# Patient Record
Sex: Male | Born: 1940 | Race: White | Hispanic: No | Smoking: Never smoker
Health system: Southern US, Community
[De-identification: ages and names within clinical notes are randomized; demographics above are authoritative.]

## PROBLEM LIST (undated history)

## (undated) DIAGNOSIS — O2230 Deep phlebothrombosis in pregnancy, unspecified trimester: Secondary | ICD-10-CM

## (undated) DIAGNOSIS — I495 Sick sinus syndrome: Secondary | ICD-10-CM

## (undated) DIAGNOSIS — I2699 Other pulmonary embolism without acute cor pulmonale: Secondary | ICD-10-CM

## (undated) DIAGNOSIS — R4182 Altered mental status, unspecified: Secondary | ICD-10-CM

## (undated) DIAGNOSIS — I10 Essential (primary) hypertension: Secondary | ICD-10-CM

## (undated) HISTORY — DX: Essential (primary) hypertension: I10

## (undated) HISTORY — DX: Sick sinus syndrome: I49.5

## (undated) HISTORY — PX: LAPAROSCOPIC CHOLECYSTECTOMY: SUR755

## (undated) HISTORY — PX: INSERT / REPLACE / REMOVE PACEMAKER: SUR710

## (undated) NOTE — *Deleted (*Deleted)
MD paged regarding persistent elevated HR. New orders received to increase metoprolol.

## (undated) NOTE — *Deleted (*Deleted)
Physical Medicine and Rehabilitation Admission H&P    Chief Complaint  Patient presents with  . Altered Mental Status  : HPI: Luke Crosby. 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  Patient reportedly lives with son independent and driving prior to admission.  1 level home with ramped entrance.  Intermittent supervision provided by family.  Presented initially presented to [facility] 04/26/2020 with gradual decline.  There was no report of any recent fall or trauma.  He was recently diagnosed with pulmonary emboli in September with increasing confusion as well as being treated for E. coli UTI.  CT the head showed a 5.8 cm left frontal intraparenchymal hemorrhage with a 4 mm rightward midline shift.  Multifocal left frontal hyperdensity some of which demonstrate internal fluid levels suspicious for septic emboli.  CT angiogram of head and neck with no large vessel occlusion dissection or aneurysm.  His Eliquis was reversed.  Lower extremity Dopplers negative for DVT.  Admission chemistries unremarkable except glucose 118 hemoglobin 12.6 hemoglobin A1c 5.8.  Echocardiogram with ejection fraction of 55% no wall motion abnormalities.  EEG suggestive of cortical dysfunction left frontal region consistent with underlying ICH.  Moderate diffuse encephalopathy no seizure.  Neurosurgery Dr. Franky Macho consulted recommend conservative care no surgical intervention.  He was cleared to begin subcutaneous heparin for DVT prophylaxis 04/28/2020.  His latest cranial CT scan 04/30/2020 showed moderate to large frontal hematoma unchanged in size intraventricular hemorrhage slightly improved no hydrocephalus.  4 mm midline shift to the right unchanged.  Patient currently remains n.p.o. with alternative means of nutritional support.  Bouts of urinary retention placed on  Urecholine.  His cardiac rate remained controlled with Eliquis on hold due to ICH.  He continues on Cardizem 90 mg every 6 hours.  Therapy evaluations completed with recommendations of physical medicine rehab consult and patient was admitted for a comprehensive rehab program.  Review of Systems  Unable to perform ROS: Acuity of condition   Past Medical History:  Diagnosis Date  . Altered mental status 05/29/2015  . DVT (deep vein thrombosis) in pregnancy   . Hypertension   . Pulmonary embolism (HCC)   . Sinus node dysfunction (HCC)    With pacemaker   Past Surgical History:  Procedure Laterality Date  . INSERT / REPLACE / REMOVE PACEMAKER  ~ 2009   Medtronic Versa VEDro1  . LAPAROSCOPIC CHOLECYSTECTOMY     Family History  Problem Relation Age of Onset  . Hypertension Sister    Social History:  reports that he has never smoked. He has quit using smokeless tobacco.  His smokeless tobacco use included chew. He reports that he does not drink alcohol and does not use drugs. Allergies: No Known Allergies Medications Prior to Admission  Medication Sig Dispense Refill  . amoxicillin (AMOXIL) 500 MG capsule Take 500 mg by mouth 3 (three) times daily.    Marland Kitchen apixaban (ELIQUIS) 5 MG TABS tablet Take 1 tablet (5 mg total) by mouth 2 (two) times daily. 60 tablet 2  . colchicine 0.6 MG tablet Take 1 tablet (0.6 mg total) by mouth 2 (two) times daily. 10 tablet 0    Drug Regimen Review Drug regimen was reviewed and remains appropriate with no significant issues identified  Home: Home Living Family/patient expects to be discharged to:: Private residence Living Arrangements: Children Available Help at Discharge: Family, Available  24 hours/day (son, niece and other family members can provide 24/7 assist) Type of Home: House Home Access: Ramped entrance Home Layout: One level Bathroom Shower/Tub: Engineer, manufacturing systems: Standard Bathroom Accessibility: Yes Home Equipment: Environmental consultant - 2  wheels, Cane - single point, Bedside commode Additional Comments: verified by son  Lives With: Son   Functional History: Prior Function Level of Independence: Independent Comments: Tourist information centre manager, drives  Functional Status:  Mobility: Bed Mobility Overal bed mobility: Needs Assistance Bed Mobility: Supine to Sit Supine to sit: Total assist, +2 for physical assistance Sit to supine: Total assist, +2 for physical assistance General bed mobility comments: Pt does not follow directional commands without addition of tactile cues.  He is generally stiff and mildly resistant to changes in position, making it difficult to come forward to upright sitting. Transfers Overall transfer level: Needs assistance Equipment used: 2 person hand held assist Transfers: Sit to/from Stand, Stand Pivot Transfers Sit to Stand: Total assist, +2 physical assistance, +2 safety/equipment, Max assist Stand pivot transfers: Max assist, Total assist, +2 safety/equipment General transfer comment: Attempted to assist forward and boost slow enough for patient to follow tactile cues but he can come out of the squat position without significant assist.  Attempted x2, once with chair for UE support. Ambulation/Gait General Gait Details: unable    ADL: ADL Overall ADL's : Needs assistance/impaired Eating/Feeding: NPO Grooming: Wash/dry face, Total assistance, Sitting Grooming Details (indicate cue type and reason): EOB (total-min guard A sitting EOB) General ADL Comments: pt currently totalA for ADL at this time. attempted hand over hand to engage in familiar/basic ADL task (combing hair), pt holding onto comb but with no attempts to carry over task, somewhat resistive to OT attempting hand over hand to perform task   Cognition: Cognition Overall Cognitive Status: No family/caregiver present to determine baseline cognitive functioning Orientation Level: Oriented to person Cognition Arousal/Alertness:  Awake/alert Behavior During Therapy: Flat affect Overall Cognitive Status: No family/caregiver present to determine baseline cognitive functioning General Comments: pt following therapist visually around the room, Localizes when his name is called, does not follow commands, vocalized potentially to question in nonsensical speech.  Physical Exam: Blood pressure (!) 135/95, pulse 93, temperature 98.3 F (36.8 C), temperature source Oral, resp. rate 19, height 5\' 6"  (1.676 m), weight 74.2 kg, SpO2 94 %. Physical Exam Neurological:     Comments: Patient does open eyes to verbal stimuli however essentially remains nonverbal with some mumbling speech.  He did follow some inconsistent one-step commands.  Nasogastric tube in place.     Results for orders placed or performed during the hospital encounter of 04/26/20 (from the past 48 hour(s))  Glucose, capillary     Status: Abnormal   Collection Time: 05/05/20 11:52 AM  Result Value Ref Range   Glucose-Capillary 148 (H) 70 - 99 mg/dL    Comment: Glucose reference range applies only to samples taken after fasting for at least 8 hours.  Glucose, capillary     Status: Abnormal   Collection Time: 05/05/20  4:37 PM  Result Value Ref Range   Glucose-Capillary 159 (H) 70 - 99 mg/dL    Comment: Glucose reference range applies only to samples taken after fasting for at least 8 hours.  Glucose, capillary     Status: Abnormal   Collection Time: 05/05/20  8:42 PM  Result Value Ref Range   Glucose-Capillary 134 (H) 70 - 99 mg/dL    Comment: Glucose reference range applies only to samples taken after fasting  for at least 8 hours.  Glucose, capillary     Status: Abnormal   Collection Time: 05/05/20 11:45 PM  Result Value Ref Range   Glucose-Capillary 167 (H) 70 - 99 mg/dL    Comment: Glucose reference range applies only to samples taken after fasting for at least 8 hours.  Glucose, capillary     Status: Abnormal   Collection Time: 05/06/20  3:55 AM   Result Value Ref Range   Glucose-Capillary 152 (H) 70 - 99 mg/dL    Comment: Glucose reference range applies only to samples taken after fasting for at least 8 hours.  Glucose, capillary     Status: Abnormal   Collection Time: 05/06/20  9:39 AM  Result Value Ref Range   Glucose-Capillary 148 (H) 70 - 99 mg/dL    Comment: Glucose reference range applies only to samples taken after fasting for at least 8 hours.  Glucose, capillary     Status: Abnormal   Collection Time: 05/06/20  1:24 PM  Result Value Ref Range   Glucose-Capillary 147 (H) 70 - 99 mg/dL    Comment: Glucose reference range applies only to samples taken after fasting for at least 8 hours.  Glucose, capillary     Status: Abnormal   Collection Time: 05/06/20  6:38 PM  Result Value Ref Range   Glucose-Capillary 160 (H) 70 - 99 mg/dL    Comment: Glucose reference range applies only to samples taken after fasting for at least 8 hours.  Glucose, capillary     Status: Abnormal   Collection Time: 05/06/20  8:16 PM  Result Value Ref Range   Glucose-Capillary 149 (H) 70 - 99 mg/dL    Comment: Glucose reference range applies only to samples taken after fasting for at least 8 hours.  Glucose, capillary     Status: Abnormal   Collection Time: 05/07/20 12:06 AM  Result Value Ref Range   Glucose-Capillary 177 (H) 70 - 99 mg/dL    Comment: Glucose reference range applies only to samples taken after fasting for at least 8 hours.   Comment 1 Notify RN   Glucose, capillary     Status: Abnormal   Collection Time: 05/07/20  4:07 AM  Result Value Ref Range   Glucose-Capillary 157 (H) 70 - 99 mg/dL    Comment: Glucose reference range applies only to samples taken after fasting for at least 8 hours.   Comment 1 Notify RN   Glucose, capillary     Status: Abnormal   Collection Time: 05/07/20  8:16 AM  Result Value Ref Range   Glucose-Capillary 169 (H) 70 - 99 mg/dL    Comment: Glucose reference range applies only to samples taken after  fasting for at least 8 hours.   Comment 1 Notify RN    Comment 2 Document in Chart    No results found.     Medical Problem List and Plan: 1.  Decreased functional with cognitive decline mobility secondary to left frontal intraparenchymal hemorrhage  -patient may *** shower  -ELOS/Goals: *** 2.  Antithrombotics: -DVT/anticoagulation/history of DVT pulmonary emboli September 2021: Vascular study lower extremities 04/26/2020 negative.  Subcutaneous heparin initiated 04/28/2020  -antiplatelet therapy: N/A 3. Pain Management: Tylenol as needed 4. Mood: Amantadine 100 mg twice daily  -antipsychotic agents: N/A 5. Neuropsych: This patient is not capable of making decisions on his own behalf. 6. Skin/Wound Care: Routine skin checks 7. Fluids/Electrolytes/Nutrition: Routine in and outs with follow-up chemistries 8.  Dysphagia.  Currently NPO.  Nasogastric tube  feeds.  Speech therapy follow-up 9.  PAF with pacemaker.  Cardiac rate controlled.  Continue Cardizem 90 mg every 6 hours.  Eliquis on hold due to IPH. 10.  BPH with urinary retention.  Urecholine 10 mg 3 times daily.  Patient did have a Foley catheter tube.  Plan voiding trial   ***  Charlton Amor, PA-C 05/07/2020

---

## 2006-03-23 ENCOUNTER — Emergency Department (HOSPITAL_COMMUNITY): Admission: EM | Admit: 2006-03-23 | Discharge: 2006-03-23 | Payer: Self-pay | Admitting: Emergency Medicine

## 2006-03-24 ENCOUNTER — Encounter: Payer: Self-pay | Admitting: Emergency Medicine

## 2006-03-24 ENCOUNTER — Inpatient Hospital Stay (HOSPITAL_COMMUNITY): Admission: EM | Admit: 2006-03-24 | Discharge: 2006-03-26 | Payer: Self-pay | Admitting: Emergency Medicine

## 2006-03-24 ENCOUNTER — Ambulatory Visit: Payer: Self-pay | Admitting: Cardiology

## 2006-03-25 ENCOUNTER — Encounter: Payer: Self-pay | Admitting: Cardiovascular Disease

## 2006-04-10 ENCOUNTER — Ambulatory Visit: Payer: Self-pay

## 2006-07-17 ENCOUNTER — Ambulatory Visit: Payer: Self-pay | Admitting: Internal Medicine

## 2006-07-17 LAB — CONVERTED CEMR LAB
BUN: 14 mg/dL (ref 6–23)
CO2: 30 meq/L (ref 19–32)
Calcium: 9.3 mg/dL (ref 8.4–10.5)
GFR calc Af Amer: 109 mL/min
GFR calc non Af Amer: 90 mL/min
Potassium: 3.8 meq/L (ref 3.5–5.1)

## 2006-08-06 ENCOUNTER — Ambulatory Visit: Payer: Self-pay | Admitting: Internal Medicine

## 2007-04-13 ENCOUNTER — Ambulatory Visit: Payer: Self-pay | Admitting: Internal Medicine

## 2007-07-22 ENCOUNTER — Ambulatory Visit: Payer: Self-pay | Admitting: Internal Medicine

## 2007-10-27 ENCOUNTER — Ambulatory Visit: Payer: Self-pay | Admitting: Internal Medicine

## 2008-02-17 ENCOUNTER — Ambulatory Visit: Payer: Self-pay | Admitting: Internal Medicine

## 2008-03-29 ENCOUNTER — Ambulatory Visit: Payer: Self-pay | Admitting: Internal Medicine

## 2008-07-11 ENCOUNTER — Ambulatory Visit: Payer: Self-pay | Admitting: Internal Medicine

## 2008-10-10 ENCOUNTER — Encounter: Payer: Self-pay | Admitting: Internal Medicine

## 2008-10-20 ENCOUNTER — Encounter: Payer: Self-pay | Admitting: Internal Medicine

## 2009-01-28 ENCOUNTER — Encounter: Payer: Self-pay | Admitting: Internal Medicine

## 2009-01-30 ENCOUNTER — Ambulatory Visit: Payer: Self-pay | Admitting: Internal Medicine

## 2009-02-06 ENCOUNTER — Encounter: Payer: Self-pay | Admitting: Internal Medicine

## 2009-04-14 DIAGNOSIS — I495 Sick sinus syndrome: Secondary | ICD-10-CM | POA: Insufficient documentation

## 2009-04-18 ENCOUNTER — Ambulatory Visit: Payer: Self-pay | Admitting: Internal Medicine

## 2009-04-18 DIAGNOSIS — R030 Elevated blood-pressure reading, without diagnosis of hypertension: Secondary | ICD-10-CM | POA: Insufficient documentation

## 2009-07-25 ENCOUNTER — Encounter: Payer: Self-pay | Admitting: Internal Medicine

## 2009-09-04 ENCOUNTER — Encounter: Payer: Self-pay | Admitting: Internal Medicine

## 2009-09-17 ENCOUNTER — Encounter: Payer: Self-pay | Admitting: Internal Medicine

## 2009-09-18 ENCOUNTER — Ambulatory Visit: Payer: Self-pay | Admitting: Internal Medicine

## 2009-09-25 ENCOUNTER — Encounter: Payer: Self-pay | Admitting: Internal Medicine

## 2009-12-20 ENCOUNTER — Ambulatory Visit: Payer: Self-pay | Admitting: Internal Medicine

## 2010-01-10 ENCOUNTER — Encounter: Payer: Self-pay | Admitting: Internal Medicine

## 2010-03-30 ENCOUNTER — Telehealth: Payer: Self-pay | Admitting: Internal Medicine

## 2010-04-12 ENCOUNTER — Encounter: Payer: Self-pay | Admitting: Internal Medicine

## 2010-04-12 ENCOUNTER — Ambulatory Visit: Payer: Self-pay | Admitting: Internal Medicine

## 2010-04-12 DIAGNOSIS — I4891 Unspecified atrial fibrillation: Secondary | ICD-10-CM | POA: Insufficient documentation

## 2010-07-16 ENCOUNTER — Ambulatory Visit
Admission: RE | Admit: 2010-07-16 | Discharge: 2010-07-16 | Payer: Self-pay | Source: Home / Self Care | Attending: Internal Medicine | Admitting: Internal Medicine

## 2010-07-16 ENCOUNTER — Encounter: Payer: Self-pay | Admitting: Internal Medicine

## 2010-07-24 NOTE — Letter (Signed)
Summary: Device-Delinquent Phone Journalist, newspaper, Main Office  [address]   [place], [place] [address]   Phone: [phone]  Fax: [phone]     September 04, 2009 MRN: [id number]   MCKADE GURKA [address] [place], [place]  [address]   Dear Mr. Hauk,  According to our records, you were scheduled for a device phone transmission on   July 19, 2009.     We did not receive any results from this check.  If you transmitted on your scheduled day, please call us to help troubleshoot your system.  If you forgot to send your transmission, please send one upon receipt of this letter.  Thank you,   Architectural technologist [facility]    2ND LETTER.AS

## 2010-07-24 NOTE — Cardiovascular Report (Signed)
Summary: Office Visit Remote   Office Visit Remote   Imported By: Roderic Ovens 10/03/2009 16:43:09  _____________________________________________________________________  External Attachment:    Type:   Image     Comment:   External Document

## 2010-07-24 NOTE — Cardiovascular Report (Signed)
Summary: Office Visit Remote   Office Visit Remote   Imported By: Roderic Ovens 01/11/2010 11:02:25  _____________________________________________________________________  External Attachment:    Type:   Image     Comment:   External Document

## 2010-07-24 NOTE — Letter (Signed)
Summary: Remote Device Check  [employer], Main Office  [address]   [place], [place] [address]   Phone: [phone]  Fax: [phone]     September 25, 2009 MRN: [id number]   DAYSHAWN IRIZARRY [address] [place], [place]  [address]   Dear Mr. Coldren,   Your remote transmission was recieved and reviewed by your physician.  All diagnostics were within normal limits for you.  __X___Your next transmission is scheduled for: December 20, 2009.  Please transmit at any time this day.  If you have a wireless device your transmission will be sent automatically.      Sincerely,  Proofreader

## 2010-07-24 NOTE — Assessment & Plan Note (Signed)
Summary: DEVICE/SAF, rs from 10/11   Visit Type:  Follow-up Primary Provider:  Dr Megan Mans  CC:  no complaints.  History of Present Illness:   Luke Crosby is seen in followup for syncope with sinus the dysfunction. He is status post pacemaker implantation. He has had no recurrent syncope.   He has been sleeping better since having received a sleeping pill.  He says that he measures his blood pressure home and it is "normal" when I asked him what that meant he agreed with 150/90 as well as 120/80.  Current Medications (verified): 1)  Aspirin 81 Mg Tabs (Aspirin) .... Take 1  Tablet Once Daily 2)  Ambien 10 Mg Tabs (Zolpidem Tartrate) .... At Bedtime  Allergies (verified): No Known Drug Allergies  Vital Signs:  Patient profile:   70 year old male Height:      64 inches Weight:      203 pounds BMI:     34.97 Pulse rate:   61 / minute BP sitting:   138 / 86  (left arm) Cuff size:   regular  Vitals Entered By: Hardin Negus, RMA (April 12, 2010 4:55 PM)  Physical Exam  General:  The patient was alert and oriented in no acute distress. HEENT Normal.  Neck veins were flat, carotids were brisk.  Lungs were clear.  Heart sounds were regular without murmurs or gallops.  Abdomen was soft with active bowel sounds. There is no clubbing cyanosis or edema. Skin Warm and dry    PPM Specifications Following MD:  Sherryl Manges, MD     PPM Vendor:  Medtronic     PPM Model Number:  [id number]     PPM Serial Number:  [id number] PPM DOI:  03/25/2006     PPM Implanting MD:  Sherryl Manges, MD  Lead 1    Location: RA     DOI: 03/25/2006     Model #: [id number]     Serial #: [id number]     Status: active Lead 2    Location: RV     DOI: 03/25/2006     Model #: [id number]     Serial #: [id number]     Status: active   Indications:  SYNCOPE, SSS   PPM Follow Up Pacer Dependent:  No       PPM Device Measurements Atrium  Amplitude: 2.8 mV, Impedance: 548 ohms, Threshold: .5 V at .4 msec Right  Ventricle  Amplitude: 11.2 mV, Impedance: 616 ohms, Threshold: 1.00 V at .4 msec  Episodes MS Episodes:  1     Coumadin:  No  Parameters Mode:  DDDR     Lower Rate Limit:  50     Upper Rate Limit:  130 Paced AV Delay:  170     Sensed AV Delay:  140 MD Comments:  11% atrial pacecd  Impression & Recommendations:  Problem # 1:  ATRIAL FIBRILLATION (ICD-427.31) identified on the monitor with rapid rates.  Chads VAsc score is 1 so will continue with aspirin His updated medication list for this problem includes:    Aspirin 81 Mg Tabs (Aspirin) .Marland Kitchen... Take 1  tablet once daily  Problem # 2:  SINOATRIAL NODE DYSFUNCTION (ICD-427.81) Device parameters and data were reviewed and no changes were made   His updated medication list for this problem includes:    Aspirin 81 Mg Tabs (Aspirin) .Marland Kitchen... Take 1  tablet once daily  Problem # 3:  SINOATRIAL NODE DYSFUNCTION (ICD-427.81)  His updated medication list for this problem  includes:    Aspirin 81 Mg Tabs (Aspirin) .Marland Kitchen... Take 1  tablet once daily  Patient Instructions: 1)  Your physician recommends that you continue on your current medications as directed. Please refer to the Current Medication list given to you today. 2)  Your physician wants you to follow-up in: 1 year  You will receive a reminder letter in the mail two months in advance. If you don't receive a letter, please call our office to schedule the follow-up appointment.

## 2010-07-24 NOTE — Letter (Signed)
Summary: Remote Device Check  [employer], Main Office  [address]   [place], [place] [address]   Phone: [phone]  Fax: [phone]     January 10, 2010 MRN: [id number]   Luke Crosby [address] [place], [place]  [address]   Dear Mr. Pensyl,   Your remote transmission was recieved and reviewed by your physician.  All diagnostics were within normal limits for you.   __X____Your next office visit is scheduled for:  October 2011 with Dr Graciela Husbands. Please call our office to schedule an appointment.    Sincerely,  Vella Kohler

## 2010-07-24 NOTE — Progress Notes (Signed)
Summary: re appt  Phone Note Call from Patient   Caller: Patient Reason for Call: Talk to Nurse Summary of Call: pt wants to rs appt, however he is requesting a friday, can he have a pacer check then? Initial call taken by: Glynda Jaeger,  March 30, 2010 3:41 PM  Follow-up for Phone Call        r/s pt. Claris Gladden, RN, BSN

## 2010-07-24 NOTE — Letter (Signed)
Summary: Device-Delinquent Phone Journalist, newspaper, Main Office  [address]   [place], [place] [address]   Phone: [phone]  Fax: [phone]     July 25, 2009 MRN: [id number]   ETHAN CLAYBURN [address] [place], [place]  [address]   Dear Mr. Gallaher,  According to our records, you were scheduled for a device phone transmission on July 19, 2009.     We did not receive any results from this check.  If you transmitted on your scheduled day, please call us to help troubleshoot your system.  If you forgot to send your transmission, please send one upon receipt of this letter.  Thank you,   Architectural technologist [facility]

## 2010-07-28 ENCOUNTER — Encounter: Payer: Self-pay | Admitting: Internal Medicine

## 2010-08-01 NOTE — Miscellaneous (Signed)
Summary: corrected device information  Clinical Lists Changes  Observations: Added new observation of MAGNET RTE: BOL 85 ERI  65 (07/28/2010 9:14) Added new observation of PPMLEADSER2: [id number] (07/28/2010 9:14)      PPM Specifications Following MD:  Sherryl Manges, MD     PPM Vendor:  Medtronic     PPM Model Number:  [id number]     PPM Serial Number:  [id number] PPM DOI:  03/25/2006     PPM Implanting MD:  Sherryl Manges, MD  Lead 1    Location: RA     DOI: 03/25/2006     Model #: 1308     Serial #: [id number]     Status: active Lead 2    Location: RV     DOI: 03/25/2006     Model #: [id number]     Serial #: [id number]     Status: active  Magnet Response Rate:  BOL 85 ERI  65  Indications:  SYNCOPE, SSS   PPM Follow Up Pacer Dependent:  No      Episodes Coumadin:  No  Parameters Mode:  DDDR     Lower Rate Limit:  50     Upper Rate Limit:  130 Paced AV Delay:  170     Sensed AV Delay:  140

## 2010-08-13 ENCOUNTER — Encounter (INDEPENDENT_AMBULATORY_CARE_PROVIDER_SITE_OTHER): Payer: Self-pay | Admitting: *Deleted

## 2010-08-21 NOTE — Cardiovascular Report (Signed)
Summary: Office Visit Remote   Office Visit Remote   Imported By: Roderic Ovens 08/17/2010 11:30:10  _____________________________________________________________________  External Attachment:    Type:   Image     Comment:   External Document

## 2010-08-21 NOTE — Letter (Signed)
Summary: Remote Device Check  [employer], Main Office  [address]   [place], [place] [address]   Phone: [phone]  Fax: [phone]     August 13, 2010 MRN: [id number]   Luke Crosby [address] [place], [place]  [address]   Dear [name],   Your remote transmission was recieved and reviewed by your physician.  All diagnostics were within normal limits for you.  __X___Your next transmission is scheduled for:  10-18-2010.  Please transmit at any time this day.  If you have a wireless device your transmission will be sent automatically.    Sincerely,  Vella Kohler

## 2010-10-18 ENCOUNTER — Ambulatory Visit (INDEPENDENT_AMBULATORY_CARE_PROVIDER_SITE_OTHER): Payer: BC Managed Care – PPO | Admitting: *Deleted

## 2010-10-18 DIAGNOSIS — I4891 Unspecified atrial fibrillation: Secondary | ICD-10-CM

## 2010-10-18 DIAGNOSIS — I495 Sick sinus syndrome: Secondary | ICD-10-CM

## 2010-10-19 ENCOUNTER — Other Ambulatory Visit: Payer: Self-pay

## 2010-10-28 NOTE — Progress Notes (Signed)
Pacer remote check  

## 2010-10-31 ENCOUNTER — Encounter: Payer: Self-pay | Admitting: *Deleted

## 2010-11-06 NOTE — Letter (Signed)
March 29, 2008    Angus G. Renard Matter, MD  [address]  [place], [place] [address]   RE:  AJAI, HARVILLE  MRN:  [id number]  /  DOB:  12/22/1940   Dear Thalia Party,   I hope this letter finds you well.  Mr. Melander is thrilled that he is  now sleeping on the Ambien that you prescribed.  He has much more energy  now and the other thing that is interesting is that his blood pressure  is much better controlled.  He has had no further syncope.   PHYSICAL EXAMINATION:  VITAL SIGNS:  His blood pressure today is 141/77  and his pulse was 55.  LUNGS:  Clear.  HEART:  Sounds were regular.  EXTREMITIES:  Had no edema.   Interrogation of his Medtronic device demonstrates a P-wave of 2.8 with  impedance of 540 and a threshold of 0.5 at 0.4.  The R-wave was 11 with  impedance of 620 and threshold 0.625 at 0.4.  Battery voltage is 2.78   IMPRESSION:  1. Syncope.  2. Sinus node dysfunction.  3. Status post pacer for the above.  4. Hypertension.  5. Sleep disturbance likely due to hypertension.   Mr. Larouche is doing terrific, Angus.  I am glad that his sleeping  patterns are better and that may well have translated secondarily into a  benefit in his blood pressure.   We will plan to see him again in 1 year's time.  He will follow up  remotely in the interim.    Sincerely,      Duke Salvia, MD, [facility]  Electronically Signed    SCK/MedQ  DD: 03/29/2008  DT: 03/30/2008  Job #: [id number]

## 2010-11-06 NOTE — Letter (Signed)
April 13, 2007    Angus G. Renard Matter, MD  [address]  [place] [address]   RE:  Luke Crosby, Luke Crosby  MRN:  [id number]  /  DOB:  29-May-1941   Dear Dr. Renard Matter:   The patient is status post pacemaker implantation for sinus node  dysfunction and syncope.  He has had no recurrent syncope.  He has no  history of atrial fibrillation.   MEDICATIONS:  1. Ambien which he takes from Dr. Renard Matter.  2. Aspirin p.r.n.   He also has a history of hypertension which has been relatively  quiescent.   PHYSICAL EXAMINATION:  VITAL SIGNS:  Today his blood pressure is up a  little bit at 148/91, pulse 58.  LUNGS:  Clear.  HEART:  Sounds were regular.  EXTREMITIES:  Without edema.   Interrogation of his Medtronic Versapulse generator demonstrates a P  wave of 2.8 with impedance of 501, threshold of 0.5 at 0.4.  The R wave  is 11 with a pacing impedance of 608, threshold of 0.75 at 0.4.  He is  atrially paced about 15% of the time and is ventricularly sensed 100% of  the time.   He also has had a number of episodes of atrial fibrillation, the longest  of which was two minutes.   IMPRESSION:  1. Bradycardia.  2. Status post pacemaker for #1.  3. Paroxysmal atrial fibrillation, duration less than a couple of      minutes.  4. Hypertension.   The patient is stable, and Angus, I am concerned a little bit about his  blood pressure and I am not sure if this is an isolated recording.  His  problem list does include this.  I have asked that he follow up with you  and maybe the use of an ACE inhibitor or ARB would be appropriate.  These have also been demonstrated to decrease frequency of atrial  fibrillation.   We will continue to follow him remotely and will see him in one years'  time in [place].   If I can be of any further assistance, please do not hesitate to contact  me.    Sincerely,      Duke Salvia, MD, [facility]  Electronically Signed    SCK/MedQ  DD:  04/13/2007  DT: 04/14/2007  Job #: [id number]

## 2010-11-09 NOTE — Assessment & Plan Note (Signed)
[facility]                         ELECTROPHYSIOLOGY OFFICE NOTE   NAME:Czerniak, ARCHIT LEGER                     MRN:          [id number]  DATE:08/06/2006                            DOB:          December 25, 1940    [name] is seen following his pacer implanted a couple months ago.  He was concerned that he is still not sleeping well and he continues to  think that it is related to his device.  His blood pressure, previously  elevated, has improved some.  He did not follow up with Dr. Renard Matter as  had been suggested as of last visit.   He has had no intercurrent syncope.   PHYSICAL EXAMINATION:  VITAL SIGNS:  His blood pressure was 142/82, his  pulse was 66.  LUNGS:  Clear.  HEART:  Heart sounds were regular.   Interrogation of his pacemaker demonstrated normal pacemaker function.  He is paced __________ his lower rate limit, so I reprogrammed his  device to a lower rate of 50.  His sleep mode is already on at 40.   IMPRESSION:  1. Sinus node dysfunction with a history of syncope.  2. Status post pacer for the above.  3. Hypertension.  4. Sleep disturbance.   I have suggested that he follow up related with sleep with Dr. Renard Matter.  He asked for a prescription for Ambien which I declined.  I did tell him  that he could use over the counter diphenhydramine if he desires, but he  should follow up with Dr. Renard Matter for sleep consultation.  He mentioned  that he has had a couple of friends who have had pacemakers who had  sleep disturbance and I am not aware of an association but will  certainly look at the literature on this.     Duke Salvia, MD, [facility]  Electronically Signed    SCK/MedQ  DD: 08/06/2006  DT: 08/06/2006  Job #: [id number]

## 2010-11-09 NOTE — Op Note (Signed)
NAMEWALLIS, VANCOTT NO.:  [id number]   MEDICAL RECORD NO.:  [id number]          PATIENT TYPE:  INP   LOCATION:  2038                         FACILITY:  [facility]   PHYSICIAN:  Duke Salvia, MD, FACCDATE OF BIRTH:  28-Aug-1940   DATE OF PROCEDURE:  03/25/2006  DATE OF DISCHARGE:                                 OPERATIVE REPORT   PREOPERATIVE DIAGNOSIS:  Syncope with sinus node dysfunction, documented.   POSTOPERATIVE DIAGNOSIS:  Syncope with sinus node dysfunction, documented.   PROCEDURE:  Dual-chamber pacemaker implantation.   SURGEON:  Duke Salvia, MD, [facility].   DESCRIPTION OF PROCEDURE:  Following obtaining informed consent, the patient  was brought to the electrophysiology laboratory and placed on the  fluoroscopic table in the supine position.  After routine prep and drape of  the left upper chest, lidocaine was infiltrated in the  prepectoral/subclavicular region.  Incision was made and carried down to the  layer of the prepectoral fascia using electrocautery and sharp dissection.  A pocket was formed similarly and hemostasis was obtained.   Thereafter, attention was turned to gaining access to the extrathoracic left  subclavian vein, which was accomplished without difficulty and without the  aspiration of air or puncture of the artery.  Two guide wires were placed  and retained, and a 0 silk suture was placed in a figure-of-eight fashion  and allowed to hang loosely.   Sequentially, 7-French tear-away introducer sheaths were placed, through  which were passed a Medtronic 5076, 58-cm active-fixation ventricular lead,  serial #[id number], and a Medtronic active-fixation atrial lead, 5076, 52 cm  in length, serial #[id number].  Under fluoroscopic guidance, the leads were  manipulated to the right ventricular high septum and the right atrial  appendage respectively, where the bipolar R wave was 16 mV, with a pacing  impedance of 1037 ohms and a pacing  threshold of 1.6 V at 0.5 msec shortly  after screw deployment.  Current at threshold was 1.8 mA.   The bipolar P wave was 1.6 mV, with a pacing impedance of 726 ohms and a  threshold of 1.2 V at 0.5 msec, and current at threshold was 2.2 mA.  These  leads were secured to the prepectoral fascia and then attached to a Guidant  Versa VEDR0, One-Pulse Plus Generator, serial [id number].  Through the  device, ventricular pacing and AV pacing with suitable effusion were  identified.  The pocket was copiously irrigated with antibiotic-containing  saline solution.  Hemostasis was assured, and the leads and the pulse  generator were placed in the pocket and secured to the prepectoral fascia.  The wound was closed in 3 layers in the normal fashion.  The wound was  washed and dried,  and a benzoin and Steri-Strips dressing was applied.  Needle counts, sponge  counts and instrument counts were correct at the end of the procedure  according to staff.  The patient tolerated the procedure without apparent  complication.           ______________________________  Duke Salvia, MD, [facility]     SCK/MEDQ  D:  03/25/2006  T:  03/25/2006  Job:  [id number]   cc:   Angus G. Renard Matter, MD

## 2010-11-09 NOTE — Letter (Signed)
July 17, 2006    Angus G. Renard Matter, MD  [address]  [place]  [place] [address]   RE:  KJELL, BRANNEN  MRN:  [id number]  /  DOB:  02-08-1941   Dear Thalia Party,   I hope this letter finds you well.  Mr. Yerger comes in today.  He has  syncope related to sinus node dysfunction.  He is status post pacer and  has had no recurrent syncope.   His major complaint is that he is having problems with sleeping.  He  used to sleep about six hours a night, and now he is sleeping 1-2 hours  a night.  There is a sense of restlessness.   His medications include only aspirin.   PHYSICAL EXAMINATION:  VITAL SIGNS:  Blood pressure 150/104.  On repeat  20 minutes later when I did, it was 170/108.  Pulse was 71.  LUNGS:  Clear.  HEART:  Heart sounds were regular.  EXTREMITIES:  Without edema.   Interrogation of his Medtronic VersaPulse generator demonstrates a P  wave of 4 with an impedence of 576 with a threshold of 0.5 and 0.4.  The  R wave was 11 with an impedence of 6 and a threshold of 0.75 and 0.4.  The device was reprogrammed.  Sleep mode was activated.   IMPRESSION:  1. Sinus node dysfunction with syncope.  2. Status post pacemaker for #1.  3. Significant hypertension, new.  4. Sleep disturbance.  Question related to #3.   Angus, Mr. Enriquez is doing fine from a pacemaker/arrhythmia point of  view.  I am concerned, however, about what the sleep disturbance is and  how it relates with blood pressure.  I should note that his wound site  also looks just fine.   I will plan to touch base with you by phone to see if we cannot get him  in to see you in the next week or so.  I am concerned that his blood  pressure is elevated and would like to get your input prior to him going  so we have a plan.    Sincerely,      Duke Salvia, MD, [facility]  Electronically Signed    SCK/MedQ  DD: 07/17/2006  DT: 07/17/2006  Job #: [id number]

## 2010-11-09 NOTE — Assessment & Plan Note (Signed)
[facility]                           ELECTROPHYSIOLOGY OFFICE NOTE   NAME:Crosby, Luke DAFT                     MRN:          [id number]  DATE:04/10/2006                            DOB:          Nov 13, 1940    Luke Crosby was seen today in the clinic on April 10, 2006 for a wound  check of his newly implanted Medtronic model number [id number] Versa.  Date of  implant was March 25, 2006 for syncope and sick sinus syndrome.  On  interrogation if his device today, his battery voltage is 2.78.  P  waves  measured 4-5.6 mV, with an atrial capture threshold of 0.5 V at 0.4 msec,  and an atrial lead impedance of 541.  R waves measured 11.2 to 15.68 mV,  with a ventricular capture threshold 0.75 V at 0.4 msec, and a ventricular  lead impedance of 594.  No changes were made in his parameters today.  His  Steri-Strips were removed.  His wound was without redness or edema, and he  will return in January 2008 for a follow-up appointment with Dr. Graciela Husbands.      ______________________________  Altha Harm, LPN    ______________________________  Duke Salvia, MD, [facility]    PO/MedQ  DD:  04/10/2006  DT:  04/11/2006  Job #:  [id number]
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
## 2011-01-17 ENCOUNTER — Ambulatory Visit (INDEPENDENT_AMBULATORY_CARE_PROVIDER_SITE_OTHER): Payer: BC Managed Care – PPO | Admitting: *Deleted

## 2011-01-17 DIAGNOSIS — I495 Sick sinus syndrome: Secondary | ICD-10-CM

## 2011-01-18 ENCOUNTER — Other Ambulatory Visit: Payer: Self-pay | Admitting: Internal Medicine

## 2011-01-18 LAB — REMOTE PACEMAKER DEVICE
AL IMPEDENCE PM: 524 Ohm
ATRIAL PACING PM: 11
BATTERY VOLTAGE: 2.78 V
RV LEAD AMPLITUDE: 8 mv
RV LEAD IMPEDENCE PM: 642 Ohm

## 2011-01-22 NOTE — Progress Notes (Signed)
Pacer remote 

## 2011-02-13 ENCOUNTER — Encounter: Payer: Self-pay | Admitting: *Deleted

## 2011-04-17 ENCOUNTER — Encounter: Payer: Self-pay | Admitting: Internal Medicine

## 2011-04-26 ENCOUNTER — Encounter: Payer: Self-pay | Admitting: Internal Medicine

## 2011-04-26 ENCOUNTER — Ambulatory Visit (INDEPENDENT_AMBULATORY_CARE_PROVIDER_SITE_OTHER): Payer: BC Managed Care – PPO | Admitting: Internal Medicine

## 2011-04-26 DIAGNOSIS — I495 Sick sinus syndrome: Secondary | ICD-10-CM

## 2011-04-26 DIAGNOSIS — Z950 Presence of cardiac pacemaker: Secondary | ICD-10-CM

## 2011-04-26 DIAGNOSIS — I4891 Unspecified atrial fibrillation: Secondary | ICD-10-CM

## 2011-04-26 DIAGNOSIS — R55 Syncope and collapse: Secondary | ICD-10-CM

## 2011-04-26 LAB — PACEMAKER DEVICE OBSERVATION
AL THRESHOLD: 0.625 V
ATRIAL PACING PM: 11
BAMS-0001: 175 {beats}/min
RV LEAD AMPLITUDE: 15.68 mv

## 2011-04-26 NOTE — Assessment & Plan Note (Signed)
No recurrent syncope 

## 2011-04-26 NOTE — Assessment & Plan Note (Signed)
Stable 11% ATRIALLY pacing

## 2011-04-26 NOTE — Assessment & Plan Note (Signed)
The patient's device was interrogated.  The information was reviewed. No changes were made in the programming.    

## 2011-04-26 NOTE — Patient Instructions (Signed)
Remote monitoring is used to monitor your Pacemaker of ICD from home. This monitoring reduces the number of office visits required to check your device to one time per year. It allows [place] to keep an eye on the functioning of your device to ensure it is working properly. You are scheduled for a device check from home on 07/25/11. You may send your transmission at any time that day. If you have a wireless device, the transmission will be sent automatically. After your physician reviews your transmission, you will receive a postcard with your next transmission date.  Your physician wants you to follow-up in: 1 year You will receive a reminder letter in the mail two months in advance. If you don't receive a letter, please call our office to schedule the follow-up appointment.  Your physician recommends that you continue on your current medications as directed. Please refer to the Current Medication list given to you today.

## 2011-04-26 NOTE — Progress Notes (Signed)
  HPI  Luke Crosby is a 70 y.o. male seen in followup for syncope with sinus the dysfunction. He is status post pacemaker implantation. He has had no recurrent syncope.  The patient denies chest pain, shortness of breath, nocturnal dyspnea, orthopnea or peripheral edema.  There have been no palpitations, lightheadedness or syncope.     Past Medical History  Diagnosis Date  . Sinus node dysfunction   . Hypertension     Past Surgical History  Procedure Date  . Insert / replace / remove pacemaker     Medtronic Versa VEDro1    Current Outpatient Prescriptions  Medication Sig Dispense Refill  . aspirin 81 MG tablet Take 81 mg by mouth daily.        Marland Kitchen zolpidem (AMBIEN) 10 MG tablet Take 10 mg by mouth at bedtime as needed.          No Known Allergies  Review of Systems negative except from HPI and PMH  Physical Exam Well developed and well nourished in no acute distress HENT normal E scleral and icterus cleaar Clear to ausculation Regular rate and rhythm, no murmurs gallops or rub Soft with active bowel sounds No clubbing cyanosis and edema Alert and oriented, grossly normal motor and sensory function Skin Warm and Dry   Assessment and  Plan

## 2011-06-13 ENCOUNTER — Encounter: Payer: Self-pay | Admitting: Internal Medicine

## 2011-07-25 ENCOUNTER — Encounter: Payer: BC Managed Care – PPO | Admitting: *Deleted

## 2011-07-29 ENCOUNTER — Encounter: Payer: Self-pay | Admitting: *Deleted

## 2011-08-09 ENCOUNTER — Encounter: Payer: Self-pay | Admitting: Internal Medicine

## 2011-08-09 ENCOUNTER — Ambulatory Visit (INDEPENDENT_AMBULATORY_CARE_PROVIDER_SITE_OTHER): Payer: BC Managed Care – PPO | Admitting: *Deleted

## 2011-08-09 DIAGNOSIS — Z950 Presence of cardiac pacemaker: Secondary | ICD-10-CM

## 2011-08-09 DIAGNOSIS — I4891 Unspecified atrial fibrillation: Secondary | ICD-10-CM

## 2011-08-09 LAB — REMOTE PACEMAKER DEVICE
AL AMPLITUDE: 2.8 mv
AL IMPEDENCE PM: 516 Ohm
BATTERY VOLTAGE: 2.77 V
RV LEAD IMPEDENCE PM: 552 Ohm
VENTRICULAR PACING PM: 0

## 2011-08-16 NOTE — Progress Notes (Signed)
Pacer remote 

## 2011-08-21 ENCOUNTER — Encounter: Payer: Self-pay | Admitting: *Deleted

## 2011-11-07 ENCOUNTER — Encounter: Payer: BC Managed Care – PPO | Admitting: *Deleted

## 2011-11-15 ENCOUNTER — Encounter: Payer: Self-pay | Admitting: *Deleted

## 2011-11-21 ENCOUNTER — Encounter: Payer: Self-pay | Admitting: Internal Medicine

## 2011-11-21 ENCOUNTER — Ambulatory Visit (INDEPENDENT_AMBULATORY_CARE_PROVIDER_SITE_OTHER): Payer: BC Managed Care – PPO | Admitting: *Deleted

## 2011-11-21 DIAGNOSIS — I4891 Unspecified atrial fibrillation: Secondary | ICD-10-CM

## 2011-11-21 DIAGNOSIS — Z950 Presence of cardiac pacemaker: Secondary | ICD-10-CM

## 2011-11-21 DIAGNOSIS — I495 Sick sinus syndrome: Secondary | ICD-10-CM

## 2011-11-22 LAB — REMOTE PACEMAKER DEVICE
AL THRESHOLD: 0.625 V
ATRIAL PACING PM: 15
RV LEAD IMPEDENCE PM: 604 Ohm
RV LEAD THRESHOLD: 0.5 V

## 2011-11-29 ENCOUNTER — Encounter: Payer: Self-pay | Admitting: *Deleted

## 2012-02-27 ENCOUNTER — Encounter: Payer: BC Managed Care – PPO | Admitting: *Deleted

## 2012-03-04 ENCOUNTER — Encounter: Payer: Self-pay | Admitting: *Deleted

## 2012-03-12 ENCOUNTER — Encounter: Payer: Self-pay | Admitting: Internal Medicine

## 2012-03-12 ENCOUNTER — Ambulatory Visit (INDEPENDENT_AMBULATORY_CARE_PROVIDER_SITE_OTHER): Payer: BC Managed Care – PPO | Admitting: *Deleted

## 2012-03-12 DIAGNOSIS — I495 Sick sinus syndrome: Secondary | ICD-10-CM

## 2012-03-12 DIAGNOSIS — Z950 Presence of cardiac pacemaker: Secondary | ICD-10-CM

## 2012-03-20 LAB — REMOTE PACEMAKER DEVICE
AL IMPEDENCE PM: 508 Ohm
BATTERY VOLTAGE: 2.76 V
RV LEAD AMPLITUDE: 22.4 mv

## 2012-04-01 ENCOUNTER — Encounter: Payer: Self-pay | Admitting: *Deleted

## 2012-05-13 ENCOUNTER — Encounter: Payer: Self-pay | Admitting: Internal Medicine

## 2012-05-13 ENCOUNTER — Ambulatory Visit (INDEPENDENT_AMBULATORY_CARE_PROVIDER_SITE_OTHER): Payer: BC Managed Care – PPO | Admitting: Internal Medicine

## 2012-05-13 VITALS — BP 162/84 | HR 68 | Ht 67.2 in | Wt 201.0 lb

## 2012-05-13 DIAGNOSIS — I4891 Unspecified atrial fibrillation: Secondary | ICD-10-CM

## 2012-05-13 DIAGNOSIS — Z950 Presence of cardiac pacemaker: Secondary | ICD-10-CM

## 2012-05-13 DIAGNOSIS — R030 Elevated blood-pressure reading, without diagnosis of hypertension: Secondary | ICD-10-CM

## 2012-05-13 LAB — PACEMAKER DEVICE OBSERVATION
AL IMPEDENCE PM: 508 Ohm
AL THRESHOLD: 0.5 V
BAMS-0001: 175 {beats}/min
RV LEAD THRESHOLD: 0.75 V

## 2012-05-13 LAB — CBC WITH DIFFERENTIAL/PLATELET
Eosinophils Relative: 4.3 % (ref 0.0–5.0)
Lymphocytes Relative: 17.6 % (ref 12.0–46.0)
Monocytes Relative: 8.3 % (ref 3.0–12.0)
Neutrophils Relative %: 69.3 % (ref 43.0–77.0)
Platelets: 68 10*3/uL — ABNORMAL LOW (ref 150.0–400.0)
WBC: 6.4 10*3/uL (ref 4.5–10.5)

## 2012-05-13 NOTE — Assessment & Plan Note (Signed)
Non susstained atrial atachycardia

## 2012-05-13 NOTE — Assessment & Plan Note (Signed)
The patient's device was interrogated and the information was fully reviewed.  The device was reprogrammed to  Utilize summed electrograms

## 2012-05-13 NOTE — Progress Notes (Signed)
Patient has no care team.   HPI  Luke Crosby is a 71 y.o. male  seen in followup for syncope with sinus the dysfunction. He is status post pacemaker implantation. He has had no recurrent syncope.  The patient denies chest pain, shortness of breath, nocturnal dyspnea, orthopnea or peripheral edema. There have been no palpitations, lightheadedness or syncope.   His major complaint is of a rash on his lower extremities he has been seen by a dermatologist and told to keep his feet elevated.  Past Medical History  Diagnosis Date  . Sinus node dysfunction   . Hypertension     Past Surgical History  Procedure Date  . Insert / replace / remove pacemaker     Medtronic Versa VEDro1    Current Outpatient Prescriptions  Medication Sig Dispense Refill  . aspirin 81 MG tablet Take 81 mg by mouth daily.        . clobetasol cream (TEMOVATE) 0.05 % Daily      . zolpidem (AMBIEN) 10 MG tablet Take 10 mg by mouth at bedtime as needed.          No Known Allergies  Review of Systems negative except from HPI and PMH  Physical Exam BP 162/84  Pulse 68  Ht 5' 7.2" (1.707 m)  Wt 201 lb (91.173 kg)  BMI 31.29 kg/m2 Well developed and well nourished in no acute distress HENT normal E scleral and icterus clear Neck Supple JVP flat; carotids brisk and full Clear to ausculation  Regular rate and rhythm, Soft with active bowel sounds No clubbing cyanosis Trace Edema Alert and oriented, grossly normal motor and sensory function Skin Warm and Dry; nonpalpable purpura who his lower extremities    Assessment and  Plan

## 2012-05-13 NOTE — Patient Instructions (Addendum)
Your physician wants you to follow-up in: 6 months with Dr. Graciela Husbands. You will receive a reminder letter in the mail two months in advance. If you don't receive a letter, please call our office to schedule the follow-up appointment.  LABS TODAY:  CBC

## 2012-05-13 NOTE — Assessment & Plan Note (Signed)
Still elevated  Asked to discusse with pcp whom he sees tomorrw

## 2013-04-15 ENCOUNTER — Telehealth: Payer: Self-pay | Admitting: Internal Medicine

## 2013-04-15 NOTE — Telephone Encounter (Signed)
New message    Pt want to know when is he due to see Dr Graciela Husbands again?

## 2013-04-15 NOTE — Telephone Encounter (Signed)
Explained to patient that per Dr. Odessa Fleming last office visit note he was to come back in 6 months. Scheduled appointment for 06/03/13. Patient agreeable to plan.

## 2013-05-11 ENCOUNTER — Encounter: Payer: BC Managed Care – PPO | Admitting: Internal Medicine

## 2013-05-17 ENCOUNTER — Encounter: Payer: BC Managed Care – PPO | Admitting: Internal Medicine

## 2013-06-02 ENCOUNTER — Encounter: Payer: BC Managed Care – PPO | Admitting: Internal Medicine

## 2013-06-03 ENCOUNTER — Encounter: Payer: Self-pay | Admitting: Internal Medicine

## 2013-06-03 ENCOUNTER — Ambulatory Visit (INDEPENDENT_AMBULATORY_CARE_PROVIDER_SITE_OTHER): Payer: BC Managed Care – PPO | Admitting: Internal Medicine

## 2013-06-03 VITALS — BP 167/99 | HR 64 | Ht 63.0 in | Wt 199.4 lb

## 2013-06-03 DIAGNOSIS — R030 Elevated blood-pressure reading, without diagnosis of hypertension: Secondary | ICD-10-CM

## 2013-06-03 DIAGNOSIS — I4891 Unspecified atrial fibrillation: Secondary | ICD-10-CM

## 2013-06-03 DIAGNOSIS — Z950 Presence of cardiac pacemaker: Secondary | ICD-10-CM

## 2013-06-03 DIAGNOSIS — I495 Sick sinus syndrome: Secondary | ICD-10-CM

## 2013-06-03 LAB — MDC_IDC_ENUM_SESS_TYPE_INCLINIC
Battery Impedance: 992 Ohm
Brady Statistic AP VS Percent: 1 %
Brady Statistic AS VP Percent: 0 %
Brady Statistic AS VS Percent: 99 %
Date Time Interrogation Session: 20141211135840
Lead Channel Impedance Value: 555 Ohm
Lead Channel Pacing Threshold Amplitude: 0.5 V
Lead Channel Pacing Threshold Amplitude: 0.5 V
Lead Channel Pacing Threshold Pulse Width: 0.4 ms
Lead Channel Pacing Threshold Pulse Width: 0.4 ms
Lead Channel Setting Sensing Sensitivity: 5.6 mV

## 2013-06-03 NOTE — Assessment & Plan Note (Signed)
One episode of Afib  2 Hrs Chads Vasc may be 1-2  So will hlod off right now with anticoagulation

## 2013-06-03 NOTE — Assessment & Plan Note (Signed)
Blood pressure is elevated but says at home is 120-30  Have asked  Him to corelate with MD office as this will be have an impact on anticoagulation decisions

## 2013-06-03 NOTE — Assessment & Plan Note (Signed)
2% apacing

## 2013-06-03 NOTE — Patient Instructions (Addendum)
Your physician recommends that you continue on your current medications as directed. Please refer to the Current Medication list given to you today.  Remote monitoring is used to monitor your Pacemaker of ICD from home. This monitoring reduces the number of office visits required to check your device to one time per year. It allows [place] to keep an eye on the functioning of your device to ensure it is working properly. You are scheduled for a device check from home on 09/06/2013. You may send your transmission at any time that day. If you have a wireless device, the transmission will be sent automatically. After your physician reviews your transmission, you will receive a postcard with your next transmission date.  Your physician wants you to follow-up in: 6 months with Dr. Graciela Husbands.  You will receive a reminder letter in the mail two months in advance. If you don't receive a letter, please call our office to schedule the follow-up appointment.

## 2013-06-03 NOTE — Assessment & Plan Note (Signed)
The patient's device was interrogated.  The information was reviewed. No changes were made in the programming.    

## 2013-06-03 NOTE — Progress Notes (Signed)
skf      Patient has no care team.   HPI  Luke Crosby is a 72 y.o. male seen in followup for syncope with sinus the dysfunction. He is status post pacemaker implantation. He has had no recurrent syncope.  The patient denies chest pain, shortness of breath, nocturnal dyspnea, orthopnea or peripheral edema. There have been no palpitations, lightheadedness or syncope.      Past Medical History  Diagnosis Date  . Sinus node dysfunction   . Hypertension     Past Surgical History  Procedure Laterality Date  . Insert / replace / remove pacemaker      Medtronic Versa VEDro1    Current Outpatient Prescriptions  Medication Sig Dispense Refill  . aspirin 81 MG tablet Take 81 mg by mouth daily.        . clobetasol cream (TEMOVATE) 0.05 % Daily      . zolpidem (AMBIEN) 10 MG tablet Take 10 mg by mouth at bedtime as needed.         No current facility-administered medications for this visit.    No Known Allergies  Review of Systems negative except from HPI and PMH  Physical Exam BP 167/99  Pulse 64  Ht 5\' 3"  (1.6 m)  Wt 199 lb 6.4 oz (90.447 kg)  BMI 35.33 kg/m2 Well developed and nourished in no acute distress HENT normal Neck supple with JVP-flat Clear Regular rate and rhythm, no murmurs or gallops Abd-soft with active BS No Clubbing cyanosis edema Skin-warm and dry A & Oriented  Grossly normal sensory and motor function    Assessment and  Plan

## 2013-07-27 ENCOUNTER — Ambulatory Visit (INDEPENDENT_AMBULATORY_CARE_PROVIDER_SITE_OTHER): Payer: BC Managed Care – PPO | Admitting: Psychology

## 2013-07-27 DIAGNOSIS — R69 Illness, unspecified: Secondary | ICD-10-CM

## 2013-07-27 DIAGNOSIS — IMO0001 Reserved for inherently not codable concepts without codable children: Secondary | ICD-10-CM

## 2013-08-03 ENCOUNTER — Telehealth (HOSPITAL_COMMUNITY): Payer: Self-pay | Admitting: *Deleted

## 2013-08-24 ENCOUNTER — Telehealth (HOSPITAL_COMMUNITY): Payer: Self-pay | Admitting: *Deleted

## 2013-08-26 ENCOUNTER — Ambulatory Visit (HOSPITAL_COMMUNITY): Payer: Self-pay | Admitting: Psychology

## 2013-09-06 ENCOUNTER — Encounter: Payer: BC Managed Care – PPO | Admitting: *Deleted

## 2013-09-08 ENCOUNTER — Ambulatory Visit (INDEPENDENT_AMBULATORY_CARE_PROVIDER_SITE_OTHER): Payer: BC Managed Care – PPO | Admitting: Psychology

## 2013-09-08 DIAGNOSIS — F4323 Adjustment disorder with mixed anxiety and depressed mood: Secondary | ICD-10-CM

## 2013-09-08 NOTE — Progress Notes (Deleted)
Psychiatric Assessment Adult  Patient Identification:  Luke Crosby Date of Evaluation:  09/08/2013 Chief Complaint: *** History of Chief Complaint:  No chief complaint on file.   HPI Review of Systems Physical Exam  Depressive Symptoms: {DEPRESSION SYMPTOMS:20000}  (Hypo) Manic Symptoms:   Elevated Mood:  {BHH YES OR NO:22294} Irritable Mood:  {BHH YES OR NO:22294} Grandiosity:  {BHH YES OR NO:22294} Distractibility:  {BHH YES OR NO:22294} Labiality of Mood:  {BHH YES OR NO:22294} Delusions:  {BHH YES OR NO:22294} Hallucinations:  {BHH YES OR NO:22294} Impulsivity:  {BHH YES OR NO:22294} Sexually Inappropriate Behavior:  {BHH YES OR NO:22294} Financial Extravagance:  {BHH YES OR NO:22294} Flight of Ideas:  {BHH YES OR NO:22294}  Anxiety Symptoms: Excessive Worry:  {BHH YES OR NO:22294} Panic Symptoms:  {BHH YES OR NO:22294} Agoraphobia:  {BHH YES OR NO:22294} Obsessive Compulsive: {BHH YES OR NO:22294}  Symptoms: {Obsessive Compulsive Symptoms:22671} Specific Phobias:  {BHH YES OR NO:22294} Social Anxiety:  {BHH YES OR NO:22294}  Psychotic Symptoms:  Hallucinations: {BHH YES OR NO:22294} {Hallucinations:22672} Delusions:  {BHH YES OR NO:22294} Paranoia:  {BHH YES OR NO:22294}   Ideas of Reference:  {BHH YES OR NO:22294}  PTSD Symptoms: Ever had a traumatic exposure:  {BHH YES OR NO:22294} Had a traumatic exposure in the last month:  {BHH YES OR NO:22294} Re-experiencing: {BHH YES OR NO:22294} {Re-experiencing:22673} Hypervigilance:  {BHH YES OR NO:22294} Hyperarousal: {BHH YES OR NO:22294} {Hyperarousal:22674} Avoidance: {BHH YES OR NO:22294} {Avoidance:22675}  Traumatic Brain Injury: {BHH YES OR NO:22294} {Traumatic Brain Injury:22676}  Past Psychiatric History: Diagnosis: ***  Hospitalizations: ***  Outpatient Care: ***  Substance Abuse Care: ***  Self-Mutilation: ***  Suicidal Attempts: ***  Violent Behaviors: ***   Past Medical History:   Past  Medical History  Diagnosis Date  . Sinus node dysfunction   . Hypertension    History of Loss of Consciousness:  {BHH YES OR NO:22294} Seizure History:  {BHH YES OR NO:22294} Cardiac History:  {BHH YES OR NO:22294} Allergies:  No Known Allergies Current Medications:  Current Outpatient Prescriptions  Medication Sig Dispense Refill  . aspirin 81 MG tablet Take 81 mg by mouth daily.        . clobetasol cream (TEMOVATE) 0.05 % Daily      . zolpidem (AMBIEN) 10 MG tablet Take 10 mg by mouth at bedtime as needed.         No current facility-administered medications for this visit.    Previous Psychotropic Medications:  Medication Dose   ***  ***                     Substance Abuse History in the last 12 months: Substance Age of 1st Use Last Use Amount Specific Type  Nicotine  ***  ***  ***  ***  Alcohol  ***  ***  ***  ***  Cannabis  ***  ***  ***  ***  Opiates  ***  ***  ***  ***  Cocaine  ***  ***  ***  ***  Methamphetamines  ***  ***  ***  ***  LSD  ***  ***  ***  ***  Ecstasy  ***   ***  ***  ***  Benzodiazepines  ***  ***  ***  ***  Caffeine  ***  ***  ***  ***  Inhalants  ***  ***  ***  ***  Others:  Medical Consequences of Substance Abuse: ***  Legal Consequences of Substance Abuse: ***  Family Consequences of Substance Abuse: ***  Blackouts:  {BHH YES OR NO:22294} DT's:  {BHH YES OR NO:22294} Withdrawal Symptoms:  {BHH YES OR NO:22294} {Withdrawal Symptoms:22677}  Social History: Current Place of Residence: *** Place of Birth: *** Family Members: *** Marital Status:  {Marital Status:22678} Children: ***  Sons: ***  Daughters: *** Relationships: *** Education:  {Education:22679} Educational Problems/Performance: *** Religious Beliefs/Practices: *** History of Abuse: {Desc; abuse:16542} Occupational Experiences; Military History:  {Military History:22680} Legal History: *** Hobbies/Interests: ***  Family History:    Family History  Problem Relation Age of Onset  . Hypertension Sister     Mental Status Examination/Evaluation: Objective:  Appearance: {Appearance:22683}  Eye Contact::  {BHH EYE CONTACT:22684}  Speech:  {Speech:22685}  Volume:  {Volume (PAA):22686}  Mood:  ***  Affect:  {Affect (PAA):22687}  Thought Process:  {Thought Process (PAA):22688}  Orientation:  {BHH ORIENTATION (PAA):22689}  Thought Content:  {Thought Content:22690}  Suicidal Thoughts:  {ST/HT (PAA):22692}  Homicidal Thoughts:  {ST/HT (PAA):22692}  Judgement:  {Judgement (PAA):22694}  Insight:  {Insight (PAA):22695}  Psychomotor Activity:  {Psychomotor (PAA):22696}  Akathisia:  {BHH YES OR NO:22294}  Handed:  {Handed:22697}  AIMS (if indicated):  ***  Assets:  {Assets (PAA):22698}    Laboratory/X-Ray Psychological Evaluation(s)   ***  ***   Assessment:  {axis diagnosis:3049000}  AXIS I {psych axis 1:31909}  AXIS II {psych axis 2:31910}  AXIS III Past Medical History  Diagnosis Date  . Sinus node dysfunction   . Hypertension      AXIS IV {psych axis iv:31915}  AXIS V {psych axis v score:31919}   Treatment Plan/Recommendations:  Plan of Care: ***  Laboratory:  {Laboratory:22682}  Psychotherapy: ***  Medications: ***  Routine PRN Medications:  {BHH YES OR NO:22294}  Consultations: ***  Safety Concerns:  ***  Other:      Lavren Lewan R, PsyD 3/18/20158:49 AM

## 2013-09-08 NOTE — Progress Notes (Signed)
asd

## 2013-09-13 ENCOUNTER — Encounter: Payer: Self-pay | Admitting: *Deleted

## 2013-09-15 ENCOUNTER — Ambulatory Visit (INDEPENDENT_AMBULATORY_CARE_PROVIDER_SITE_OTHER): Payer: BC Managed Care – PPO | Admitting: *Deleted

## 2013-09-15 DIAGNOSIS — I4891 Unspecified atrial fibrillation: Secondary | ICD-10-CM

## 2013-09-15 DIAGNOSIS — I495 Sick sinus syndrome: Secondary | ICD-10-CM

## 2013-09-15 LAB — MDC_IDC_ENUM_SESS_TYPE_REMOTE
Battery Impedance: 1125 Ohm
Battery Remaining Longevity: 4
Battery Voltage: 2.75 V
Brady Statistic AP VP Percent: 0.1 % — CL
Brady Statistic AS VS Percent: 98.8 %
Lead Channel Impedance Value: 487 Ohm
Lead Channel Pacing Threshold Amplitude: 0.375 V
Lead Channel Pacing Threshold Pulse Width: 0.4 ms
Lead Channel Pacing Threshold Pulse Width: 0.4 ms
Lead Channel Setting Pacing Amplitude: 2 V
Lead Channel Setting Pacing Pulse Width: 0.4 ms
Lead Channel Setting Sensing Sensitivity: 5.6 mV
MDC IDC MSMT LEADCHNL RA PACING THRESHOLD AMPLITUDE: 0.5 V
MDC IDC MSMT LEADCHNL RA SENSING INTR AMPL: 2.8 mV
MDC IDC MSMT LEADCHNL RV IMPEDANCE VALUE: 539 Ohm
MDC IDC MSMT LEADCHNL RV SENSING INTR AMPL: 11.2 mV
MDC IDC SET LEADCHNL RA PACING AMPLITUDE: 1.5 V
MDC IDC STAT BRADY AP VS PERCENT: 0.9 %
MDC IDC STAT BRADY AS VP PERCENT: 0.2 %

## 2013-09-21 NOTE — Progress Notes (Signed)
d 

## 2013-10-21 ENCOUNTER — Encounter: Payer: Self-pay | Admitting: *Deleted

## 2013-11-05 ENCOUNTER — Encounter: Payer: Self-pay | Admitting: Internal Medicine

## 2013-12-20 ENCOUNTER — Ambulatory Visit (INDEPENDENT_AMBULATORY_CARE_PROVIDER_SITE_OTHER): Payer: BC Managed Care – PPO | Admitting: *Deleted

## 2013-12-20 ENCOUNTER — Telehealth: Payer: Self-pay | Admitting: Cardiology

## 2013-12-20 DIAGNOSIS — I495 Sick sinus syndrome: Secondary | ICD-10-CM

## 2013-12-20 LAB — MDC_IDC_ENUM_SESS_TYPE_REMOTE
Brady Statistic AP VS Percent: 1 %
Brady Statistic AS VS Percent: 98 %
Date Time Interrogation Session: 20150629172639
Lead Channel Impedance Value: 529 Ohm
Lead Channel Pacing Threshold Amplitude: 0.5 V
Lead Channel Pacing Threshold Pulse Width: 0.4 ms
Lead Channel Pacing Threshold Pulse Width: 0.4 ms
Lead Channel Sensing Intrinsic Amplitude: 11.2 mV
Lead Channel Setting Pacing Amplitude: 1.5 V
Lead Channel Setting Sensing Sensitivity: 4 mV
MDC IDC MSMT BATTERY IMPEDANCE: 1181 Ohm
MDC IDC MSMT BATTERY REMAINING LONGEVITY: 47 mo
MDC IDC MSMT BATTERY VOLTAGE: 2.75 V
MDC IDC MSMT LEADCHNL RA IMPEDANCE VALUE: 466 Ohm
MDC IDC MSMT LEADCHNL RA PACING THRESHOLD AMPLITUDE: 0.5 V
MDC IDC MSMT LEADCHNL RA SENSING INTR AMPL: 1.4 mV
MDC IDC SET LEADCHNL RV PACING AMPLITUDE: 2 V
MDC IDC SET LEADCHNL RV PACING PULSEWIDTH: 0.4 ms
MDC IDC STAT BRADY AP VP PERCENT: 0 %
MDC IDC STAT BRADY AS VP PERCENT: 0 %

## 2013-12-20 NOTE — Telephone Encounter (Signed)
Spoke with pt and reminded pt of remote transmission that is due today. Pt verbalized understanding.   

## 2013-12-20 NOTE — Progress Notes (Signed)
Remote pacemaker transmission.   

## 2014-01-05 ENCOUNTER — Encounter: Payer: Self-pay | Admitting: Cardiology

## 2014-01-11 ENCOUNTER — Encounter: Payer: Self-pay | Admitting: Internal Medicine

## 2014-03-21 ENCOUNTER — Ambulatory Visit (INDEPENDENT_AMBULATORY_CARE_PROVIDER_SITE_OTHER): Payer: BC Managed Care – PPO | Admitting: *Deleted

## 2014-03-21 ENCOUNTER — Encounter: Payer: Self-pay | Admitting: Internal Medicine

## 2014-03-21 DIAGNOSIS — I495 Sick sinus syndrome: Secondary | ICD-10-CM

## 2014-03-21 NOTE — Progress Notes (Signed)
Remote pacemaker transmission.   

## 2014-03-23 LAB — MDC_IDC_ENUM_SESS_TYPE_REMOTE
Battery Impedance: 1291 Ohm
Battery Voltage: 2.75 V
Brady Statistic AP VP Percent: 0 %
Brady Statistic AP VS Percent: 2 %
Brady Statistic AS VS Percent: 98 %
Date Time Interrogation Session: 20150928142739
Lead Channel Impedance Value: 474 Ohm
Lead Channel Impedance Value: 553 Ohm
Lead Channel Pacing Threshold Pulse Width: 0.4 ms
Lead Channel Pacing Threshold Pulse Width: 0.4 ms
Lead Channel Sensing Intrinsic Amplitude: 1.4 mV
Lead Channel Setting Pacing Amplitude: 2 V
Lead Channel Setting Sensing Sensitivity: 5.6 mV
MDC IDC MSMT BATTERY REMAINING LONGEVITY: 43 mo
MDC IDC MSMT LEADCHNL RA PACING THRESHOLD AMPLITUDE: 0.5 V
MDC IDC MSMT LEADCHNL RV PACING THRESHOLD AMPLITUDE: 0.375 V
MDC IDC MSMT LEADCHNL RV SENSING INTR AMPL: 8 mV
MDC IDC SET LEADCHNL RA PACING AMPLITUDE: 1.5 V
MDC IDC SET LEADCHNL RV PACING PULSEWIDTH: 0.4 ms
MDC IDC STAT BRADY AS VP PERCENT: 0 %

## 2014-04-12 ENCOUNTER — Encounter: Payer: Self-pay | Admitting: Cardiology

## 2014-05-24 ENCOUNTER — Encounter: Payer: Self-pay | Admitting: Internal Medicine

## 2014-07-14 ENCOUNTER — Encounter: Payer: Self-pay | Admitting: Internal Medicine

## 2014-07-14 ENCOUNTER — Ambulatory Visit (INDEPENDENT_AMBULATORY_CARE_PROVIDER_SITE_OTHER): Payer: Medicare HMO | Admitting: Internal Medicine

## 2014-07-14 VITALS — BP 170/106 | HR 61 | Ht 65.0 in | Wt 195.0 lb

## 2014-07-14 DIAGNOSIS — I480 Paroxysmal atrial fibrillation: Secondary | ICD-10-CM | POA: Diagnosis not present

## 2014-07-14 DIAGNOSIS — Z45018 Encounter for adjustment and management of other part of cardiac pacemaker: Secondary | ICD-10-CM | POA: Diagnosis not present

## 2014-07-14 DIAGNOSIS — I495 Sick sinus syndrome: Secondary | ICD-10-CM | POA: Diagnosis not present

## 2014-07-14 LAB — MDC_IDC_ENUM_SESS_TYPE_INCLINIC
Battery Voltage: 2.74 V
Brady Statistic AP VP Percent: 0 %
Brady Statistic AS VS Percent: 98 %
Lead Channel Impedance Value: 481 Ohm
Lead Channel Pacing Threshold Amplitude: 0.5 V
Lead Channel Sensing Intrinsic Amplitude: 15.67 mV
Lead Channel Setting Pacing Amplitude: 1.5 V
Lead Channel Setting Pacing Amplitude: 2 V
Lead Channel Setting Pacing Pulse Width: 0.4 ms
Lead Channel Setting Sensing Sensitivity: 4 mV
MDC IDC MSMT BATTERY IMPEDANCE: 1374 Ohm
MDC IDC MSMT BATTERY REMAINING LONGEVITY: 42 mo
MDC IDC MSMT LEADCHNL RA PACING THRESHOLD AMPLITUDE: 0.75 V
MDC IDC MSMT LEADCHNL RA PACING THRESHOLD PULSEWIDTH: 0.4 ms
MDC IDC MSMT LEADCHNL RA SENSING INTR AMPL: 2 mV
MDC IDC MSMT LEADCHNL RV IMPEDANCE VALUE: 506 Ohm
MDC IDC MSMT LEADCHNL RV PACING THRESHOLD PULSEWIDTH: 0.4 ms
MDC IDC SESS DTM: 20160121150559
MDC IDC STAT BRADY AP VS PERCENT: 2 %
MDC IDC STAT BRADY AS VP PERCENT: 0 %

## 2014-07-14 MED ORDER — CHLORTHALIDONE 25 MG PO TABS: 25.0000 mg | ORAL_TABLET | Freq: Every day | ORAL | Status: DC

## 2014-07-14 NOTE — Patient Instructions (Addendum)
Your physician has recommended you make the following change in your medication:  1) START Chlorthalidone 25 mg daily  Your physician recommends that you schedule a follow-up appointment in: 2 weeks with your primary care provider.  Remote monitoring is used to monitor your Pacemaker of ICD from home. This monitoring reduces the number of office visits required to check your device to one time per year. It allows us to keep an eye on the functioning of your device to ensure it is working properly. You are scheduled for a device check from home on 10/13/14. You may send your transmission at any time that day. If you have a wireless device, the transmission will be sent automatically. After your physician reviews your transmission, you will receive a postcard with your next transmission date.  Your physician wants you to follow-up in: 1 year with Dr. Graciela HusbandsKlein.  You will receive a reminder letter in the mail two months in advance. If you don't receive a letter, please call our office to schedule the follow-up appointment.

## 2014-07-14 NOTE — Progress Notes (Signed)
skf      No care team member to display   HPI  Luke Crosby is a 74 y.o. male seen in followup for syncope with sinus the dysfunction. He is status post pacemaker implantation. He has had no recurrent syncope.  The patient denies chest pain, shortness of breath, nocturnal dyspnea, orthopnea or peripheral edema. There have been no palpitations, lightheadedness or syncope.  His BP is very high      Past Medical History  Diagnosis Date  . Sinus node dysfunction   . Hypertension     Past Surgical History  Procedure Laterality Date  . Insert / replace / remove pacemaker      Medtronic Versa VEDro1    Current Outpatient Prescriptions  Medication Sig Dispense Refill  . aspirin 81 MG tablet Take 81 mg by mouth daily.      Marland Kitchen. zolpidem (AMBIEN) 10 MG tablet Take 10 mg by mouth at bedtime as needed.       No current facility-administered medications for this visit.    No Known Allergies  Review of Systems negative except from HPI and PMH  Physical Exam BP 170/106 mmHg  Pulse 61  Ht 5\' 5"  (1.651 m)  Wt 195 lb (88.451 kg)  BMI 32.45 kg/m2 Well developed and nourished in no acute distress HENT normal Neck supple with JVP-flat Clear Device pocket well healed; without hematoma or erythema.  There is no tethering Regular rate and rhythm, no murmurs or gallops Abd-soft with active BS No Clubbing cyanosis edema Skin-warm and dry A & Oriented  Grossly normal sensory and motor function  Sinus at 61 17/11/42     Assessment and  Plan  Sinus node dysfunction   Pacemaker  Medtronic   Hypertension   Will begin on chlorthalidone   He is to followup with PCP in about two weeks  [facility]

## 2014-08-15 ENCOUNTER — Encounter: Payer: Self-pay | Admitting: Internal Medicine

## 2014-10-13 ENCOUNTER — Encounter: Payer: Self-pay | Admitting: Internal Medicine

## 2014-10-13 ENCOUNTER — Ambulatory Visit (INDEPENDENT_AMBULATORY_CARE_PROVIDER_SITE_OTHER): Payer: Medicare HMO | Admitting: *Deleted

## 2014-10-13 ENCOUNTER — Telehealth: Payer: Self-pay | Admitting: Cardiology

## 2014-10-13 DIAGNOSIS — I495 Sick sinus syndrome: Secondary | ICD-10-CM | POA: Diagnosis not present

## 2014-10-13 LAB — MDC_IDC_ENUM_SESS_TYPE_REMOTE
Battery Impedance: 1545 Ohm
Battery Remaining Longevity: 37 mo
Brady Statistic AP VP Percent: 0 %
Brady Statistic AS VP Percent: 1 %
Date Time Interrogation Session: 20160421182305
Lead Channel Impedance Value: 468 Ohm
Lead Channel Sensing Intrinsic Amplitude: 1.4 mV
Lead Channel Sensing Intrinsic Amplitude: 8 mV
Lead Channel Setting Pacing Amplitude: 1.5 V
Lead Channel Setting Pacing Amplitude: 2 V
Lead Channel Setting Sensing Sensitivity: 4 mV
MDC IDC MSMT BATTERY VOLTAGE: 2.74 V
MDC IDC MSMT LEADCHNL RA PACING THRESHOLD AMPLITUDE: 0.625 V
MDC IDC MSMT LEADCHNL RA PACING THRESHOLD PULSEWIDTH: 0.4 ms
MDC IDC MSMT LEADCHNL RV IMPEDANCE VALUE: 530 Ohm
MDC IDC MSMT LEADCHNL RV PACING THRESHOLD AMPLITUDE: 0.5 V
MDC IDC MSMT LEADCHNL RV PACING THRESHOLD PULSEWIDTH: 0.4 ms
MDC IDC SET LEADCHNL RV PACING PULSEWIDTH: 0.4 ms
MDC IDC STAT BRADY AP VS PERCENT: 1 %
MDC IDC STAT BRADY AS VS PERCENT: 98 %

## 2014-10-13 NOTE — Progress Notes (Signed)
Remote pacemaker transmission.   

## 2014-10-13 NOTE — Telephone Encounter (Signed)
LMOVM reminding pt to send remote transmission.   

## 2014-11-11 ENCOUNTER — Encounter: Payer: Self-pay | Admitting: Cardiology

## 2015-01-12 ENCOUNTER — Encounter: Payer: Self-pay | Admitting: Internal Medicine

## 2015-01-12 ENCOUNTER — Ambulatory Visit (INDEPENDENT_AMBULATORY_CARE_PROVIDER_SITE_OTHER): Payer: Medicare HMO | Admitting: *Deleted

## 2015-01-12 DIAGNOSIS — I495 Sick sinus syndrome: Secondary | ICD-10-CM

## 2015-01-12 NOTE — Progress Notes (Signed)
Remote pacemaker transmission.   

## 2015-01-28 LAB — CUP PACEART REMOTE DEVICE CHECK
Battery Voltage: 2.73 V
Brady Statistic AS VP Percent: 1 %
Brady Statistic AS VS Percent: 97 %
Date Time Interrogation Session: 20160721124030
Lead Channel Pacing Threshold Amplitude: 0.625 V
Lead Channel Pacing Threshold Pulse Width: 0.4 ms
Lead Channel Sensing Intrinsic Amplitude: 11.2 mV
Lead Channel Setting Pacing Amplitude: 1.5 V
Lead Channel Setting Pacing Pulse Width: 0.46 ms
MDC IDC MSMT BATTERY IMPEDANCE: 1775 Ohm
MDC IDC MSMT BATTERY REMAINING LONGEVITY: 33 mo
MDC IDC MSMT LEADCHNL RA IMPEDANCE VALUE: 471 Ohm
MDC IDC MSMT LEADCHNL RA PACING THRESHOLD PULSEWIDTH: 0.4 ms
MDC IDC MSMT LEADCHNL RA SENSING INTR AMPL: 1.4 mV
MDC IDC MSMT LEADCHNL RV IMPEDANCE VALUE: 517 Ohm
MDC IDC MSMT LEADCHNL RV PACING THRESHOLD AMPLITUDE: 0.375 V
MDC IDC SET LEADCHNL RV PACING AMPLITUDE: 2 V
MDC IDC SET LEADCHNL RV SENSING SENSITIVITY: 4 mV
MDC IDC STAT BRADY AP VP PERCENT: 0 %
MDC IDC STAT BRADY AP VS PERCENT: 2 %

## 2015-02-08 ENCOUNTER — Encounter: Payer: Self-pay | Admitting: Cardiology

## 2015-04-17 ENCOUNTER — Ambulatory Visit (INDEPENDENT_AMBULATORY_CARE_PROVIDER_SITE_OTHER): Payer: Medicare HMO | Admitting: *Deleted

## 2015-04-17 DIAGNOSIS — I495 Sick sinus syndrome: Secondary | ICD-10-CM | POA: Diagnosis not present

## 2015-04-18 NOTE — Progress Notes (Signed)
PPM REMOTE 

## 2015-04-21 ENCOUNTER — Encounter: Payer: Self-pay | Admitting: Cardiology

## 2015-04-21 LAB — CUP PACEART REMOTE DEVICE CHECK
Battery Impedance: 1904 Ohm
Battery Voltage: 2.74 V
Brady Statistic AP VP Percent: 0 %
Brady Statistic AP VS Percent: 3 %
Implantable Lead Implant Date: 20071002
Implantable Lead Location: 753859
Implantable Lead Model: 5076
Implantable Lead Model: 5076
Lead Channel Impedance Value: 474 Ohm
Lead Channel Impedance Value: 542 Ohm
Lead Channel Pacing Threshold Amplitude: 0.625 V
Lead Channel Sensing Intrinsic Amplitude: 1.4 mV
Lead Channel Setting Pacing Pulse Width: 0.4 ms
MDC IDC LEAD IMPLANT DT: 20071002
MDC IDC LEAD LOCATION: 753860
MDC IDC MSMT BATTERY REMAINING LONGEVITY: 31 mo
MDC IDC MSMT LEADCHNL RA PACING THRESHOLD PULSEWIDTH: 0.4 ms
MDC IDC MSMT LEADCHNL RV PACING THRESHOLD AMPLITUDE: 0.375 V
MDC IDC MSMT LEADCHNL RV PACING THRESHOLD PULSEWIDTH: 0.4 ms
MDC IDC MSMT LEADCHNL RV SENSING INTR AMPL: 11.2 mV
MDC IDC SESS DTM: 20161024125045
MDC IDC SET LEADCHNL RA PACING AMPLITUDE: 1.5 V
MDC IDC SET LEADCHNL RV PACING AMPLITUDE: 2 V
MDC IDC SET LEADCHNL RV SENSING SENSITIVITY: 4 mV
MDC IDC STAT BRADY AS VP PERCENT: 1 %
MDC IDC STAT BRADY AS VS PERCENT: 96 %

## 2015-05-29 ENCOUNTER — Emergency Department (HOSPITAL_COMMUNITY): Payer: Medicare HMO

## 2015-05-29 ENCOUNTER — Encounter (HOSPITAL_COMMUNITY): Payer: Self-pay | Admitting: Family Medicine

## 2015-05-29 ENCOUNTER — Observation Stay (HOSPITAL_COMMUNITY)
Admission: EM | Admit: 2015-05-29 | Discharge: 2015-05-30 | Discharge status: Against Medical Advice | Payer: Medicare HMO | Attending: Internal Medicine | Admitting: Internal Medicine

## 2015-05-29 DIAGNOSIS — I10 Essential (primary) hypertension: Secondary | ICD-10-CM | POA: Diagnosis not present

## 2015-05-29 DIAGNOSIS — R4182 Altered mental status, unspecified: Secondary | ICD-10-CM | POA: Diagnosis present

## 2015-05-29 DIAGNOSIS — I495 Sick sinus syndrome: Secondary | ICD-10-CM | POA: Diagnosis not present

## 2015-05-29 DIAGNOSIS — R410 Disorientation, unspecified: Secondary | ICD-10-CM | POA: Diagnosis not present

## 2015-05-29 DIAGNOSIS — Z87891 Personal history of nicotine dependence: Secondary | ICD-10-CM | POA: Diagnosis not present

## 2015-05-29 DIAGNOSIS — I480 Paroxysmal atrial fibrillation: Secondary | ICD-10-CM | POA: Diagnosis not present

## 2015-05-29 DIAGNOSIS — Z7982 Long term (current) use of aspirin: Secondary | ICD-10-CM | POA: Diagnosis not present

## 2015-05-29 DIAGNOSIS — Z950 Presence of cardiac pacemaker: Secondary | ICD-10-CM | POA: Diagnosis not present

## 2015-05-29 DIAGNOSIS — R531 Weakness: Secondary | ICD-10-CM | POA: Diagnosis not present

## 2015-05-29 DIAGNOSIS — R269 Unspecified abnormalities of gait and mobility: Secondary | ICD-10-CM | POA: Insufficient documentation

## 2015-05-29 DIAGNOSIS — G459 Transient cerebral ischemic attack, unspecified: Secondary | ICD-10-CM | POA: Diagnosis present

## 2015-05-29 DIAGNOSIS — G4700 Insomnia, unspecified: Secondary | ICD-10-CM | POA: Insufficient documentation

## 2015-05-29 DIAGNOSIS — Z8249 Family history of ischemic heart disease and other diseases of the circulatory system: Secondary | ICD-10-CM | POA: Insufficient documentation

## 2015-05-29 HISTORY — DX: Altered mental status, unspecified: R41.82

## 2015-05-29 LAB — CBC
HCT: 44.8 % (ref 39.0–52.0)
Hemoglobin: 14.9 g/dL (ref 13.0–17.0)
MCH: 29.9 pg (ref 26.0–34.0)
MCHC: 33.3 g/dL (ref 30.0–36.0)
MCV: 89.8 fL (ref 78.0–100.0)
PLATELETS: 62 10*3/uL — AB (ref 150–400)
RBC: 4.99 MIL/uL (ref 4.22–5.81)
RDW: 13.6 % (ref 11.5–15.5)
WBC: 7.9 10*3/uL (ref 4.0–10.5)

## 2015-05-29 LAB — BASIC METABOLIC PANEL
Anion gap: 8 (ref 5–15)
BUN: 22 mg/dL — AB (ref 6–20)
CHLORIDE: 101 mmol/L (ref 101–111)
CO2: 28 mmol/L (ref 22–32)
CREATININE: 1.03 mg/dL (ref 0.61–1.24)
Calcium: 9.2 mg/dL (ref 8.9–10.3)
GFR calc Af Amer: >60 mL/min (ref >60)
GFR calc non Af Amer: >60 mL/min (ref >60)
Glucose, Bld: 133 mg/dL — ABNORMAL HIGH (ref 65–99)
Potassium: 3.4 mmol/L — ABNORMAL LOW (ref 3.5–5.1)
SODIUM: 137 mmol/L (ref 135–145)

## 2015-05-29 LAB — URINALYSIS, ROUTINE W REFLEX MICROSCOPIC
GLUCOSE, UA: NEGATIVE mg/dL
Ketones, ur: 15 mg/dL — AB
Leukocytes, UA: NEGATIVE
Nitrite: NEGATIVE
PH: 5 (ref 5.0–8.0)
Protein, ur: 100 mg/dL — AB
SPECIFIC GRAVITY, URINE: 1.026 (ref 1.005–1.030)

## 2015-05-29 LAB — URINE MICROSCOPIC-ADD ON

## 2015-05-29 LAB — I-STAT CG4 LACTIC ACID, ED: LACTIC ACID, VENOUS: 1.58 mmol/L (ref 0.5–2.0)

## 2015-05-29 LAB — I-STAT TROPONIN, ED: Troponin i, poc: 0.01 ng/mL (ref 0.00–0.08)

## 2015-05-29 NOTE — ED Notes (Signed)
Pt here for weakness, confusion and sts left arm pain. Denies chest pain. sts similar episode years ago when he was having syncopal episodes before he had a pacemaker placed. sts also some diarrhea and loss of appetite.
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
## 2015-05-29 NOTE — ED Notes (Signed)
Attempted to call report

## 2015-05-30 ENCOUNTER — Other Ambulatory Visit (HOSPITAL_COMMUNITY): Payer: Self-pay

## 2015-05-30 ENCOUNTER — Encounter (HOSPITAL_COMMUNITY): Payer: Self-pay | Admitting: Family Medicine

## 2015-05-30 ENCOUNTER — Encounter (HOSPITAL_COMMUNITY): Payer: Self-pay

## 2015-05-30 DIAGNOSIS — R4182 Altered mental status, unspecified: Secondary | ICD-10-CM | POA: Diagnosis not present

## 2015-05-30 DIAGNOSIS — Z950 Presence of cardiac pacemaker: Secondary | ICD-10-CM | POA: Diagnosis not present

## 2015-05-30 DIAGNOSIS — I480 Paroxysmal atrial fibrillation: Secondary | ICD-10-CM

## 2015-05-30 DIAGNOSIS — G459 Transient cerebral ischemic attack, unspecified: Secondary | ICD-10-CM | POA: Diagnosis present

## 2015-05-30 LAB — AMMONIA: Ammonia: 27 umol/L (ref 9–35)

## 2015-05-30 LAB — HEPATIC FUNCTION PANEL
ALK PHOS: 62 U/L (ref 38–126)
ALT: 22 U/L (ref 17–63)
AST: 25 U/L (ref 15–41)
Albumin: 3.6 g/dL (ref 3.5–5.0)
BILIRUBIN INDIRECT: 1.7 mg/dL — AB (ref 0.3–0.9)
Bilirubin, Direct: 0.4 mg/dL (ref 0.1–0.5)
TOTAL PROTEIN: 6.9 g/dL (ref 6.5–8.1)
Total Bilirubin: 2.1 mg/dL — ABNORMAL HIGH (ref 0.3–1.2)

## 2015-05-30 LAB — LIPID PANEL
Cholesterol: 136 mg/dL (ref 0–200)
HDL: 31 mg/dL — AB (ref >40)
LDL CALC: 88 mg/dL (ref 0–99)
TRIGLYCERIDES: 85 mg/dL (ref <150)
Total CHOL/HDL Ratio: 4.4 RATIO
VLDL: 17 mg/dL (ref 0–40)

## 2015-05-30 LAB — GLUCOSE, CAPILLARY
Glucose-Capillary: 113 mg/dL — ABNORMAL HIGH (ref 65–99)
Glucose-Capillary: 119 mg/dL — ABNORMAL HIGH (ref 65–99)

## 2015-05-30 MED ORDER — ENOXAPARIN SODIUM 40 MG/0.4ML ~~LOC~~ SOLN
40.0000 mg | Freq: Every day | SUBCUTANEOUS | Status: DC
  Administered 2015-05-30: 40 mg via SUBCUTANEOUS
  Filled 2015-05-30: qty 0.4

## 2015-05-30 MED ORDER — ASPIRIN 325 MG PO TABS
325.0000 mg | ORAL_TABLET | Freq: Every day | ORAL | Status: DC
  Filled 2015-05-30: qty 1

## 2015-05-30 MED ORDER — ACETAMINOPHEN 650 MG RE SUPP: 650.0000 mg | RECTAL | Status: DC | PRN

## 2015-05-30 MED ORDER — ACETAMINOPHEN 325 MG PO TABS: 650.0000 mg | ORAL_TABLET | ORAL | Status: DC | PRN

## 2015-05-30 MED ORDER — STROKE: EARLY STAGES OF RECOVERY BOOK
Freq: Once | Status: AC
  Administered 2015-05-30: 01:00:00
  Filled 2015-05-30: qty 1

## 2015-05-30 MED ORDER — ZOLPIDEM TARTRATE 5 MG PO TABS
5.0000 mg | ORAL_TABLET | Freq: Once | ORAL | Status: AC
  Administered 2015-05-30: 5 mg via ORAL
  Filled 2015-05-30: qty 1

## 2015-05-30 NOTE — Progress Notes (Signed)
Patient arrived to 5w13 via stretcher. No acute distress noted denies any pain.  Will continue to monitor.

## 2015-05-30 NOTE — Discharge Summary (Signed)
[facility]  Luke Crosby [id number] DOB: Jun 01, 1941 DOA: 05/29/2015  PCP: Luke ReichertMCINNIS,Luke G, MD  Admit date: 05/29/2015 Discharge date: 05/30/2015  Time spent: < 30 minutes  Patient left AGAINST MEDICAL ADVICE  Discharge Diagnoses:  Active Problems:   Sinoatrial node dysfunction (HCC)   Pacemaker-medtronic   Altered mental status   TIA (transient ischemic attack)  Discharge Condition: guarded  History of present illness:  See H&P, Labs, Consult and Test reports for all details in brief, patient is a 74 y.o. male with a past medical history significant for pAF on aspirin and pacemaker who presents with confusion  Hospital Course:  Patient's confusion has resolved, on rounds he is AxO to time, place and situation. Patient at this time expresses desire to leave the Hospital immediately, patient has been warned that this is not Medically advisable at this time, and can result in Medical complications like Death and Disability, patient understands and accepts the risks involved and assumes full responsibilty of this decision. Workup with 2D echo, pacer interrogation, carotids dopplers were pending.  Procedures:  None    Consultations:  None   Discharge Exam: Filed Vitals:   05/30/15 0047 05/30/15 0217 05/30/15 0413 05/30/15 0627  BP:  116/70 138/65 108/68  Pulse:  71 73 77  Temp: 98.7 F (37.1 C) 98.6 F (37 C) 98.9 F (37.2 C) 98.7 F (37.1 C)  TempSrc:  Oral Oral Oral  Resp:  18 18 18   SpO2:  94% 97% 93%   General: NAD Cardiovascular: RRR Respiratory: CTA biL  Discharge Instructions Activity:  As tolerated   Get Medicines reviewed and adjusted: Please take all your medications with you for your next visit with your Primary MD  Please request your Primary MD to go over all hospital tests and procedure/radiological results at the follow up, please ask your Primary MD to get all Hospital records sent to his/her office.  If you  experience worsening of your admission symptoms, develop shortness of breath, life threatening emergency, suicidal or homicidal thoughts you must seek medical attention immediately by calling 911 or calling your MD immediately if symptoms less severe.  You must read complete instructions/literature along with all the possible adverse reactions/side effects for all the Medicines you take and that have been prescribed to you. Take any new Medicines after you have completely understood and accpet all the possible adverse reactions/side effects.   Do not drive when taking Pain medications.   Do not take more than prescribed Pain, Sleep and Anxiety Medications  Special Instructions: If you have smoked or chewed Tobacco in the last 2 yrs please stop smoking, stop any regular Alcohol and or any Recreational drug use.  Wear Seat belts while driving.  Please note You were cared for by a hospitalist during your hospital stay. Once you are discharged, your primary care physician will handle any further medical issues. Please note that NO REFILLS for any discharge medications will be authorized once you are discharged, as it is imperative that you return to your primary care physician (or establish a relationship with a primary care physician if you do not have one) for your aftercare needs so that they can reassess your need for medications and monitor your lab values.    Medication List    ASK your doctor about these medications        aspirin 81 MG tablet  Take 81 mg by mouth daily.     zolpidem 10 MG tablet  Commonly known  as:  AMBIEN  Take 10 mg by mouth at bedtime.       The results of significant diagnostics from this hospitalization (including imaging, microbiology, ancillary and laboratory) are listed below for reference.    Significant Diagnostic Studies: Dg Chest 2 View  05/29/2015  CLINICAL DATA:  Chest pain EXAM: CHEST  2 VIEW COMPARISON:  03/26/2006 FINDINGS: Lungs are clear.   No pleural effusion or pneumothorax. The heart is normal in size.  Left subclavian pacemaker. Degenerative changes of the visualized thoracolumbar spine. IMPRESSION: No evidence of acute cardiopulmonary disease. Electronically Signed   By: Charline Bills M.D.   On: 05/29/2015 19:22   Ct Head Wo Contrast  05/29/2015  CLINICAL DATA:  Confusion and altered mental status with left arm weakness EXAM: CT HEAD WITHOUT CONTRAST TECHNIQUE: Contiguous axial images were obtained from the base of the skull through the vertex without intravenous contrast. COMPARISON:  03/23/2006 FINDINGS: The bony calvarium is intact. Mild atrophic changes are seen. Diffuse chronic white matter ischemic changes noted bilaterally. This has increased in the interval from the prior exam. No findings to suggest acute hemorrhage, acute infarction or space-occupying mass lesion are noted. IMPRESSION: Chronic atrophic and ischemic changes.  No acute abnormality noted. Electronically Signed   By: Alcide Clever M.D.   On: 05/29/2015 20:30   Labs: Basic Metabolic Panel:  Recent Labs Lab 05/29/15 1838  NA 137  K 3.4*  CL 101  CO2 28  GLUCOSE 133*  BUN 22*  CREATININE 1.03  CALCIUM 9.2   Liver Function Tests:  Recent Labs Lab 05/30/15 0829  AST 25  ALT 22  ALKPHOS 62  BILITOT 2.1*  PROT 6.9  ALBUMIN 3.6    Recent Labs Lab 05/30/15 0829  AMMONIA 27   CBC:  Recent Labs Lab 05/29/15 1838  WBC 7.9  HGB 14.9  HCT 44.8  MCV 89.8  PLT 62*    CBG:  Recent Labs Lab 05/30/15 0020 05/30/15 0746  GLUCAP 119* 113*       Signed:  GHERGHE, COSTIN  [facility] 05/30/2015, 12:54 PM

## 2015-05-30 NOTE — Progress Notes (Signed)
Patient educated on importance to stay for stroke workup but he still stated he was leaving against medical advice. Dr. Elvera LennoxGherghe aware and spoke with patient before he left. Patient signed AMA form and it was placed in the chart.
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
## 2015-05-31 LAB — HEMOGLOBIN A1C
Hgb A1c MFr Bld: 6.4 % — ABNORMAL HIGH (ref 4.8–5.6)
Mean Plasma Glucose: 137 mg/dL

## 2015-08-17 ENCOUNTER — Ambulatory Visit (INDEPENDENT_AMBULATORY_CARE_PROVIDER_SITE_OTHER): Payer: Medicare HMO | Admitting: Internal Medicine

## 2015-08-17 ENCOUNTER — Encounter: Payer: Self-pay | Admitting: Internal Medicine

## 2015-08-17 VITALS — BP 126/80 | HR 62 | Ht 63.5 in | Wt 189.1 lb

## 2015-08-17 DIAGNOSIS — I495 Sick sinus syndrome: Secondary | ICD-10-CM

## 2015-08-17 DIAGNOSIS — I480 Paroxysmal atrial fibrillation: Secondary | ICD-10-CM | POA: Diagnosis not present

## 2015-08-17 DIAGNOSIS — Z950 Presence of cardiac pacemaker: Secondary | ICD-10-CM | POA: Diagnosis not present

## 2015-08-17 LAB — CUP PACEART INCLINIC DEVICE CHECK
Battery Impedance: 2028 Ohm
Battery Voltage: 2.72 V
Brady Statistic AP VP Percent: 0 %
Brady Statistic AP VS Percent: 3 %
Brady Statistic AS VP Percent: 1 %
Implantable Lead Implant Date: 20071002
Implantable Lead Implant Date: 20071002
Implantable Lead Location: 753859
Lead Channel Impedance Value: 476 Ohm
Lead Channel Pacing Threshold Amplitude: 0.5 V
Lead Channel Pacing Threshold Amplitude: 0.5 V
Lead Channel Pacing Threshold Amplitude: 0.5 V
Lead Channel Sensing Intrinsic Amplitude: 1.4 mV
Lead Channel Sensing Intrinsic Amplitude: 15.67 mV
Lead Channel Setting Pacing Amplitude: 1.5 V
Lead Channel Setting Pacing Amplitude: 2 V
Lead Channel Setting Pacing Pulse Width: 0.4 ms
MDC IDC LEAD LOCATION: 753860
MDC IDC MSMT BATTERY REMAINING LONGEVITY: 29 mo
MDC IDC MSMT LEADCHNL RA PACING THRESHOLD AMPLITUDE: 0.5 V
MDC IDC MSMT LEADCHNL RA PACING THRESHOLD PULSEWIDTH: 0.4 ms
MDC IDC MSMT LEADCHNL RA PACING THRESHOLD PULSEWIDTH: 0.4 ms
MDC IDC MSMT LEADCHNL RV IMPEDANCE VALUE: 529 Ohm
MDC IDC MSMT LEADCHNL RV PACING THRESHOLD PULSEWIDTH: 0.4 ms
MDC IDC MSMT LEADCHNL RV PACING THRESHOLD PULSEWIDTH: 0.4 ms
MDC IDC SESS DTM: 20170223173421
MDC IDC SET LEADCHNL RV SENSING SENSITIVITY: 5.6 mV
MDC IDC STAT BRADY AS VS PERCENT: 96 %

## 2015-08-17 LAB — BASIC METABOLIC PANEL WITH GFR
BUN: 14 mg/dL (ref 7–25)
CO2: 26 mmol/L (ref 20–31)
Calcium: 9.1 mg/dL (ref 8.6–10.3)
Chloride: 98 mmol/L (ref 98–110)
Creat: 0.9 mg/dL (ref 0.70–1.18)
Glucose, Bld: 87 mg/dL (ref 65–99)
Potassium: 3.6 mmol/L (ref 3.5–5.3)
Sodium: 138 mmol/L (ref 135–146)

## 2015-08-17 MED ORDER — CHLORTHALIDONE 25 MG PO TABS: 25.0000 mg | ORAL_TABLET | Freq: Every day | ORAL | Status: DC

## 2015-08-17 NOTE — Progress Notes (Signed)
skf      Patient Care Team: Gareth Morgan, MD as PCP - General (Family Medicine)   HPI  Luke Crosby is a 75 y.o. male seen in followup for syncope with sinus the dysfunction. He is status post pacemaker implantation. He has had no recurrent syncope.  The patient denies chest pain, shortness of breath, nocturnal dyspnea, orthopnea or peripheral edema. There have been no palpitations, lightheadedness or syncope.       Past Medical History  Diagnosis Date  . Sinus node dysfunction (HCC)   . Hypertension   . Altered mental status 05/29/2015    Past Surgical History  Procedure Laterality Date  . Laparoscopic cholecystectomy    . Insert / replace / remove pacemaker  ~ 2009    Medtronic Versa [id number]    Current Outpatient Prescriptions  Medication Sig Dispense Refill  . aspirin 81 MG tablet Take 81 mg by mouth daily.      . chlorthalidone (HYGROTON) 25 MG tablet Take 25 mg by mouth daily.    Marland Kitchen zolpidem (AMBIEN) 10 MG tablet Take 10 mg by mouth at bedtime.      No current facility-administered medications for this visit.    No Known Allergies  Review of Systems negative except from HPI and PMH  Physical Exam BP 126/80 mmHg  Pulse 62  Ht 5' 3.5" (1.613 m)  Wt 189 lb 1.9 oz (85.784 kg)  BMI 32.97 kg/m2 Well developed and nourished in no acute distress HENT normal Neck supple with JVP-flat Clear Device pocket well healed; without hematoma or erythema.  There is no tethering Regular rate and rhythm, no murmurs or gallops Abd-soft with active BS No Clubbing cyanosis edema Skin-warm and dry A & Oriented  Grossly normal sensory and motor function  Sinus at  62 Intervals 19/11/43 RSR prime   Assessment and  Plan  Sinus node dysfunction   Syncope  Pacemaker  Medtronic The patient's device was interrogated.  The information was reviewed. No changes were made in the programming.     Hypertension   Blood pressure is well-controlled on chlorthalidone. We  will check his metabolic profile.  No recurrent syncope

## 2015-08-17 NOTE — Patient Instructions (Signed)
Medication Instructions: - Your physician recommends that you continue on your current medications as directed. Please refer to the Current Medication list given to you today.  Labwork: - Your physician recommends that you have lab work today: [employer]  Procedures/Testing: - none  Follow-Up: - Remote monitoring is used to monitor your Pacemaker of ICD from home. This monitoring reduces the number of office visits required to check your device to one time per year. It allows [place] to keep an eye on the functioning of your device to ensure it is working properly. You are scheduled for a device check from home on 11/16/15. You may send your transmission at any time that day. If you have a wireless device, the transmission will be sent automatically. After your physician reviews your transmission, you will receive a postcard with your next transmission date.  - Your physician wants you to follow-up in: 1 year with Dr. Graciela Husbands. You will receive a reminder letter in the mail two months in advance. If you don't receive a letter, please call our office to schedule the follow-up appointment.   Any Additional Special Instructions Will Be Listed Below (If Applicable).     If you need a refill on your cardiac medications before your next appointment, please call your pharmacy.

## 2015-11-16 ENCOUNTER — Encounter: Payer: Medicare HMO | Admitting: *Deleted

## 2015-11-16 ENCOUNTER — Telehealth: Payer: Self-pay | Admitting: Cardiology

## 2015-11-16 NOTE — Telephone Encounter (Signed)
Spoke with pt and reminded pt of remote transmission that is due today. Pt verbalized understanding.   

## 2015-11-17 ENCOUNTER — Encounter: Payer: Self-pay | Admitting: Cardiology

## 2016-09-24 ENCOUNTER — Encounter: Payer: Self-pay | Admitting: *Deleted

## 2016-09-24 ENCOUNTER — Ambulatory Visit (INDEPENDENT_AMBULATORY_CARE_PROVIDER_SITE_OTHER): Payer: Medicare HMO | Admitting: Internal Medicine

## 2016-09-24 ENCOUNTER — Encounter: Payer: Self-pay | Admitting: Internal Medicine

## 2016-09-24 ENCOUNTER — Encounter (INDEPENDENT_AMBULATORY_CARE_PROVIDER_SITE_OTHER): Payer: Self-pay

## 2016-09-24 VITALS — BP 110/82 | HR 66 | Ht 62.0 in | Wt 196.0 lb

## 2016-09-24 DIAGNOSIS — Z01812 Encounter for preprocedural laboratory examination: Secondary | ICD-10-CM

## 2016-09-24 DIAGNOSIS — Z950 Presence of cardiac pacemaker: Secondary | ICD-10-CM

## 2016-09-24 DIAGNOSIS — I495 Sick sinus syndrome: Secondary | ICD-10-CM | POA: Diagnosis not present

## 2016-09-24 NOTE — Patient Instructions (Addendum)
Medication Instructions: - Your physician recommends that you continue on your current medications as directed. Please refer to the Current Medication list given to you today.  Labwork: - Your physician recommends that you return for lab work: Monday 10/14/16 (BMP/CBC/INR)- lab is open from 7:30 am- 4:45 pm  Procedures/Testing: - Your physician has recommended that you have a pacemaker generator (battery) change..  Follow-Up: - Your physician recommends that you schedule a follow-up appointment in: about 14 days (from 10/21/16) for a wound check with the Device clinic.   Any Additional Special Instructions Will Be Listed Below (If Applicable).     If you need a refill on your cardiac medications before your next appointment, please call your pharmacy.

## 2016-09-24 NOTE — Progress Notes (Signed)
       Patient Care Team: Gareth Morgan, MD as PCP - General (Family Medicine)   HPI  Luke Crosby is a 76 y.o. male seen in followup for syncope with sinus the dysfunction. He is status post pacemaker implantation. He has had no recurrent syncope.   His device has reached ERI -- no change in symptoms with reversion   The patient denies chest pain, shortness of breath, nocturnal dyspnea, orthopnea or peripheral edema. There have been no palpitations, lightheadedness or syncope.       Past Medical History:  Diagnosis Date  . Altered mental status 05/29/2015  . Hypertension   . Sinus node dysfunction [facility])     Past Surgical History:  Procedure Laterality Date  . INSERT / REPLACE / REMOVE PACEMAKER  ~ 2009   Medtronic Versa VEDro1  . LAPAROSCOPIC CHOLECYSTECTOMY      Current Outpatient Prescriptions  Medication Sig Dispense Refill  . aspirin 81 MG tablet Take 81 mg by mouth daily.      . chlorthalidone (HYGROTON) 25 MG tablet Take 1 tablet (25 mg total) by mouth daily. 90 tablet 3  . zolpidem (AMBIEN) 10 MG tablet Take 10 mg by mouth at bedtime.      No current facility-administered medications for this visit.     No Known Allergies  Review of Systems negative except from HPI and PMH  Physical Exam BP 110/82   Pulse 66   Ht  (1.575 m)   Wt 196 lb (88.9 kg)   SpO2 98%   BMI 35.85 kg/m  Well developed and nourished in no acute distress HENT normal Neck supple with JVP-flat Clear Device pocket well healed; without hematoma or erythema.  There is no tethering  Regular rate and rhythm, no murmurs or gallops Abd-soft with active BS No Clubbing cyanosis edema Skin-warm and dry A & Oriented  Grossly normal sensory and motor function  Sinus at  V pacing at 65 w VA conduction  Assessment and  Plan  Sinus node dysfunction   Syncope  Pacemaker  Medtronic The patient's device was interrogated.  The information was reviewed. No changes were made in  the programming.     Hypertension   Blood pressure is well-controlled on chlorthalidone, although it may be lower than normal 2/2  V pacing  Device has reached ERI  We have reviewed the benefits and risks of generator replacement.  These include but are not limited to lead fracture and infection.  The patient understands, agrees and is willing to proceed.    No recurrent syncope

## 2016-09-26 NOTE — Addendum Note (Signed)
Addended by: Dareen Piano on: 09/26/2016 07:14 AM   Modules accepted: Orders

## 2016-10-14 ENCOUNTER — Other Ambulatory Visit: Payer: Medicare HMO

## 2016-10-14 DIAGNOSIS — I495 Sick sinus syndrome: Secondary | ICD-10-CM

## 2016-10-14 DIAGNOSIS — Z01812 Encounter for preprocedural laboratory examination: Secondary | ICD-10-CM

## 2016-10-15 LAB — CBC WITH DIFFERENTIAL/PLATELET
Basophils Absolute: 0 10*3/uL (ref 0.0–0.2)
Basos: 1 %
EOS (ABSOLUTE): 0.1 10*3/uL (ref 0.0–0.4)
EOS: 2 %
HEMATOCRIT: 43.5 % (ref 37.5–51.0)
Hemoglobin: 14.7 g/dL (ref 13.0–17.7)
IMMATURE GRANS (ABS): 0 10*3/uL (ref 0.0–0.1)
IMMATURE GRANULOCYTES: 0 %
Lymphocytes Absolute: 1.1 10*3/uL (ref 0.7–3.1)
Lymphs: 17 %
MCH: 29.5 pg (ref 26.6–33.0)
MCHC: 33.8 g/dL (ref 31.5–35.7)
MCV: 87 fL (ref 79–97)
MONOCYTES: 11 %
MONOS ABS: 0.7 10*3/uL (ref 0.1–0.9)
Neutrophils Absolute: 4.6 10*3/uL (ref 1.4–7.0)
Neutrophils: 69 %
Platelets: 116 10*3/uL — ABNORMAL LOW (ref 150–379)
RBC: 4.98 x10E6/uL (ref 4.14–5.80)
RDW: 13.9 % (ref 12.3–15.4)
WBC: 6.6 10*3/uL (ref 3.4–10.8)

## 2016-10-15 LAB — BASIC METABOLIC PANEL
BUN/Creatinine Ratio: 21 (ref 10–24)
BUN: 23 mg/dL (ref 8–27)
CHLORIDE: 100 mmol/L (ref 96–106)
CO2: 24 mmol/L (ref 18–29)
Calcium: 9.1 mg/dL (ref 8.6–10.2)
Creatinine, Ser: 1.07 mg/dL (ref 0.76–1.27)
GFR, EST AFRICAN AMERICAN: 78 mL/min/{1.73_m2} (ref >59)
GFR, EST NON AFRICAN AMERICAN: 67 mL/min/{1.73_m2} (ref >59)
Glucose: 120 mg/dL — ABNORMAL HIGH (ref 65–99)
POTASSIUM: 3.7 mmol/L (ref 3.5–5.2)
SODIUM: 141 mmol/L (ref 134–144)

## 2016-10-15 LAB — PROTIME-INR
INR: 1 (ref 0.8–1.2)
Prothrombin Time: 11.1 s (ref 9.1–12.0)

## 2016-10-18 ENCOUNTER — Telehealth: Payer: Self-pay | Admitting: Internal Medicine

## 2016-10-18 DIAGNOSIS — I495 Sick sinus syndrome: Secondary | ICD-10-CM

## 2016-10-18 DIAGNOSIS — Z01812 Encounter for preprocedural laboratory examination: Secondary | ICD-10-CM

## 2016-10-18 NOTE — Telephone Encounter (Signed)
New message    Pt is calling stating he wants his procedure done at [facility].

## 2016-10-18 NOTE — Telephone Encounter (Signed)
Spoke with pt and made him aware that generator changes have to be done at [facility], unable to do at [facility].  Pt also needed to cancel gen change for 10/21/16.  States he has things he needs to take care of that day.  Pt wants to have gen change on 11/04/16 instead if possible.  Advised pt that I will send message to [facility], Dr. Odessa Fleming nurse and have her follow up with him next week.  Pt appreciative for call.

## 2016-10-21 ENCOUNTER — Encounter (HOSPITAL_COMMUNITY): Admission: RE | Payer: Self-pay | Source: Ambulatory Visit

## 2016-10-21 ENCOUNTER — Ambulatory Visit (HOSPITAL_COMMUNITY): Admission: RE | Admit: 2016-10-21 | Payer: Medicare HMO | Source: Ambulatory Visit | Admitting: Internal Medicine

## 2016-10-21 SURGERY — PPM GENERATOR CHANGEOUT

## 2016-10-21 NOTE — Telephone Encounter (Signed)
I called and spoke with the patient and notified him that the next available date for a generator change out is not until 11/20/16. He is agreeable with this- I advised him I will call him back later today to confirm the time.  He is agreeable.

## 2016-10-22 ENCOUNTER — Encounter: Payer: Self-pay | Admitting: *Deleted

## 2016-10-22 NOTE — Telephone Encounter (Signed)
Late entry- I called and confirmed with the patient that his generator change is scheduled for 11/20/16 at 8:30 am- he will arrive at 6:30 am. He will come for labs on 11/11/16. He is aware that a detailed letter of instructions will be mailed to him. Mailing address confirmed. He still has his surgical scrub.

## 2016-11-04 ENCOUNTER — Ambulatory Visit: Payer: Self-pay

## 2016-11-06 ENCOUNTER — Ambulatory Visit: Payer: Self-pay

## 2016-11-11 ENCOUNTER — Other Ambulatory Visit: Payer: Self-pay

## 2016-11-20 ENCOUNTER — Ambulatory Visit (HOSPITAL_COMMUNITY): Admission: RE | Admit: 2016-11-20 | Payer: Medicare HMO | Source: Ambulatory Visit | Admitting: Internal Medicine

## 2016-11-20 ENCOUNTER — Encounter (HOSPITAL_COMMUNITY): Admission: RE | Payer: Self-pay | Source: Ambulatory Visit

## 2016-11-20 SURGERY — PPM GENERATOR CHANGEOUT

## 2016-11-20 NOTE — H&P (Signed)
Pt ws no show

## 2016-11-21 ENCOUNTER — Telehealth: Payer: Self-pay | Admitting: Internal Medicine

## 2016-11-21 ENCOUNTER — Telehealth: Payer: Self-pay

## 2016-11-21 NOTE — Telephone Encounter (Signed)
Reviewed the patient's chart today- he no showed for his device generator replacement yesterday. Spoke with the patient- he states that he knew the procedure was scheduled for yesterday, but he "felt fine" so he didn't go because he didn't feel like he needed to.  I advised him that his battery tripped ERI on 08/26/16 and that we only have about a 3 month window to replace this.  The patient saw Dr. Graciela HusbandsKlein in early April and was scheduled for a gen change on 4/30., but called to reschedule to 11/20/16. [facility] and wanted his procedure done else where.  I advised him that our physicians only [facility] and if he wanted to go to another hospital, then he will need to find another doctor as our doctors don't have priveledges at any other facility. He states he will just wait until his device is checked again before scheduling a generator change. I advised him that he is not scheduled for anything at the present time as he was due for his battery change out.  He states his transmitter box is not working because it was linked to his land line phone and he no longer has this.  I advised I will have the device clinic reach out to him about having this checked again. Discussed risk of battery going EOL. He would like his device checked again.  Dr. Graciela HusbandsKlein made aware.

## 2016-11-21 NOTE — Telephone Encounter (Addendum)
-----   Message from Jefferey PicaHeather C McGhee, RN sent at 11/21/2016 12:22 PM EDT ----- Can someone please call this patient to speak with him.  He was ERI on 08/26/16, saw [facility], scheduled for gen change on 4/30, r/s to 5/30 then no showed yesterday for his battery change. I spoke with him today- he states he didn't go yesterday because he felt fine.  I don't think he is dependent, but he wants his device checked again prior to doing anything. He states his transmitter isn't working because it was on his land line phone and he doesn't have this anymore.  Discussed risk of him having his battery go EOL.  He apparently needs some more convincing because he feels ok right now.   Thanks!

## 2016-11-21 NOTE — Telephone Encounter (Signed)
Spoke with pt regarding regarding generator pacemaker generator changed, attempted to explain to pt that his device will reach end of life soon, meaning that it will no longer work, pt stated " I feel fine". Informed pt that although he feels fine, after the 3 months of device reaching ERI which was reached 08/2016, that the company that made the device could not guarantee the device would function. Pt stated "I want to know who told you to tell me this, I want to know what is really going on." Informed pt that what I just told him was factual. Pt stated that he wants the device checked informed pt that I could not safely check his device because the amount of power it takes to check the device could potentially kill is device. Pt stated well I will make an appointment to have the device checked, reiterated the above statement to pt. Pt stated will I don't like [facility] anyway and I will just go somewhere else and have my device checked, then pt hung up the phone.

## 2016-12-02 ENCOUNTER — Ambulatory Visit: Payer: Self-pay

## 2016-12-02 ENCOUNTER — Telehealth: Payer: Self-pay | Admitting: *Deleted

## 2016-12-02 NOTE — Telephone Encounter (Signed)
Spoke with patient, advised him to not come for pacemaker wound check appointment today at 2pm as he canceled his pacemaker generator change procedure and his current pacemaker cannot safely be checked.  Patient verbalized understanding of appointment cancellation and disconnected call.

## 2018-04-22 DIAGNOSIS — I10 Essential (primary) hypertension: Secondary | ICD-10-CM | POA: Diagnosis not present

## 2018-04-22 DIAGNOSIS — G4700 Insomnia, unspecified: Secondary | ICD-10-CM | POA: Diagnosis not present

## 2018-04-22 DIAGNOSIS — Z950 Presence of cardiac pacemaker: Secondary | ICD-10-CM | POA: Diagnosis not present

## 2018-04-22 DIAGNOSIS — Z23 Encounter for immunization: Secondary | ICD-10-CM | POA: Diagnosis not present

## 2020-02-13 ENCOUNTER — Encounter (HOSPITAL_COMMUNITY): Payer: Self-pay | Admitting: *Deleted

## 2020-02-13 ENCOUNTER — Emergency Department (HOSPITAL_COMMUNITY)
Admission: EM | Admit: 2020-02-13 | Discharge: 2020-02-13 | Disposition: A | Discharge status: Home / Self Care | Payer: Medicare HMO | Attending: Emergency Medicine | Admitting: Emergency Medicine

## 2020-02-13 ENCOUNTER — Emergency Department (HOSPITAL_COMMUNITY): Payer: Medicare HMO

## 2020-02-13 ENCOUNTER — Other Ambulatory Visit: Payer: Self-pay

## 2020-02-13 DIAGNOSIS — Z79899 Other long term (current) drug therapy: Secondary | ICD-10-CM | POA: Diagnosis not present

## 2020-02-13 DIAGNOSIS — Z7982 Long term (current) use of aspirin: Secondary | ICD-10-CM | POA: Insufficient documentation

## 2020-02-13 DIAGNOSIS — R000 Tachycardia, unspecified: Secondary | ICD-10-CM | POA: Insufficient documentation

## 2020-02-13 DIAGNOSIS — Z87891 Personal history of nicotine dependence: Secondary | ICD-10-CM | POA: Insufficient documentation

## 2020-02-13 DIAGNOSIS — K5900 Constipation, unspecified: Secondary | ICD-10-CM | POA: Diagnosis not present

## 2020-02-13 DIAGNOSIS — R339 Retention of urine, unspecified: Secondary | ICD-10-CM | POA: Insufficient documentation

## 2020-02-13 DIAGNOSIS — I10 Essential (primary) hypertension: Secondary | ICD-10-CM | POA: Diagnosis not present

## 2020-02-13 DIAGNOSIS — R1030 Lower abdominal pain, unspecified: Secondary | ICD-10-CM | POA: Diagnosis present

## 2020-02-13 DIAGNOSIS — Z950 Presence of cardiac pacemaker: Secondary | ICD-10-CM | POA: Insufficient documentation

## 2020-02-13 LAB — URINALYSIS, ROUTINE W REFLEX MICROSCOPIC
Bilirubin Urine: NEGATIVE
Glucose, UA: NEGATIVE mg/dL
Hgb urine dipstick: NEGATIVE
Ketones, ur: NEGATIVE mg/dL
Leukocytes,Ua: NEGATIVE
Nitrite: NEGATIVE
Protein, ur: NEGATIVE mg/dL
Specific Gravity, Urine: 1.017 (ref 1.005–1.030)
pH: 6 (ref 5.0–8.0)

## 2020-02-13 LAB — CBC WITH DIFFERENTIAL/PLATELET
Abs Immature Granulocytes: 0.04 10*3/uL (ref 0.00–0.07)
Basophils Absolute: 0.1 10*3/uL (ref 0.0–0.1)
Basophils Relative: 1 %
Eosinophils Absolute: 0.1 10*3/uL (ref 0.0–0.5)
Eosinophils Relative: 1 %
HCT: 45.3 % (ref 39.0–52.0)
Hemoglobin: 14.7 g/dL (ref 13.0–17.0)
Immature Granulocytes: 1 %
Lymphocytes Relative: 6 %
Lymphs Abs: 0.6 10*3/uL — ABNORMAL LOW (ref 0.7–4.0)
MCH: 30.6 pg (ref 26.0–34.0)
MCHC: 32.5 g/dL (ref 30.0–36.0)
MCV: 94.2 fL (ref 80.0–100.0)
Monocytes Absolute: 0.6 10*3/uL (ref 0.1–1.0)
Monocytes Relative: 7 %
Neutro Abs: 7.5 10*3/uL (ref 1.7–7.7)
Neutrophils Relative %: 84 %
Platelets: 152 10*3/uL (ref 150–400)
RBC: 4.81 MIL/uL (ref 4.22–5.81)
RDW: 12.6 % (ref 11.5–15.5)
WBC: 8.8 10*3/uL (ref 4.0–10.5)
nRBC: 0 % (ref 0.0–0.2)

## 2020-02-13 LAB — BASIC METABOLIC PANEL WITH GFR
Anion gap: 12 (ref 5–15)
BUN: 22 mg/dL (ref 8–23)
CO2: 29 mmol/L (ref 22–32)
Calcium: 9.7 mg/dL (ref 8.9–10.3)
Chloride: 96 mmol/L — ABNORMAL LOW (ref 98–111)
Creatinine, Ser: 1.29 mg/dL — ABNORMAL HIGH (ref 0.61–1.24)
GFR calc Af Amer: >60 mL/min
GFR calc non Af Amer: 52 mL/min — ABNORMAL LOW
Glucose, Bld: 129 mg/dL — ABNORMAL HIGH (ref 70–99)
Potassium: 3.7 mmol/L (ref 3.5–5.1)
Sodium: 137 mmol/L (ref 135–145)

## 2020-02-13 NOTE — Discharge Instructions (Signed)
I would like for you to go to the pharmacy and get a medication called MiraLAX, take it as follows  Take 1 scoop of MiraLAX in a glass of water or juice or the drink of your choice.  Do this 3 times a day until you have completely evacuated your colon, this will give you diarrhea for the next several days but will help you to have a large bowel movement.  The reason that you are not able to urinate is likely because you are so constipated that is pushing on your bladder.  You will need to keep the catheter in place for the next week.  Please follow-up with the urologist, I have given you the phone number above.  They will likely be able to remove this catheter when you follow-up if everything is working correctly.  Seek medical exam for increasing pain or if the catheter is not working correctly.  Do not try to pull this out by yourself, it will cause bleeding and pain if you do.

## 2020-02-13 NOTE — ED Notes (Signed)
Patient transported to X-ray 

## 2020-02-13 NOTE — ED Notes (Signed)
Bladder scan showed . MD notified.

## 2020-02-13 NOTE — ED Triage Notes (Signed)
Pt c/o not being able to urinate, loose stool, was seen by pcp on Friday, pt poor historian, was seen by pcp and given milk of magnesium.

## 2020-02-13 NOTE — ED Provider Notes (Signed)
[facility] EMERGENCY DEPARTMENT Provider Note   CSN: [id number] Arrival date & time: 02/13/20  1914     History Chief Complaint  Patient presents with  . Urinary Retention    Luke Crosby is a 79 y.o. male.  HPI   This patient is a 79 year old male, he has a history of hypertension, he presents to the hospital with a complaint of lower abdominal discomfort, unfortunately this patient is a very poor historian but is able to tell me that he was at his doctor's office several days ago and was given and told to take milk of magnesia, it is unclear exactly what his symptoms were as he tells me that he was not constipated but then tells me that he may be was constipated and at the same time is unable to tell me what constipation is.  He has been having loose stools for several days, he is wearing a pull-up kind of diaper and has had to change this several times overnight.  He is telling me that he has not been able to urinate since yesterday and is having the feeling that he needs to go but cannot pass any urine.  He has not had fevers or chills and is not nauseated or having any vomiting.  He denies any prior abdominal surgical history.  He denies chest pain or shortness of breath.  Past Medical History:  Diagnosis Date  . Altered mental status 05/29/2015  . Hypertension   . Sinus node dysfunction [facility])     Patient Active Problem List   Diagnosis Date Noted  . TIA (transient ischemic attack) 05/30/2015  . Altered mental status 05/29/2015  . Elevated blood pressure 06/03/2013  . 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 Syncope 04/26/2011  . Atrial fibrillation (HCC) 04/12/2010  . Sinoatrial node dysfunction (HCC) 04/14/2009    Past Surgical History:  Procedure Laterality Date  . INSERT / REPLACE / REMOVE PACEMAKER  ~ 2009   Medtronic Versa VEDro1  . LAPAROSCOPIC CHOLECYSTECTOMY         Family History  Problem Relation Age of Onset  . Hypertension Sister     Social History     Tobacco Use  . Smoking status: Never Smoker  . Smokeless tobacco: Former Neurosurgeon    Types: Chew  . Tobacco comment: 05/30/2015 "chewed tobacco for 40 yrs; quit early 2000's"  Substance Use Topics  . Alcohol use: No  . Drug use: No    Home Medications Prior to Admission medications   Medication Sig Start Date End Date Taking? Authorizing Provider  aspirin 81 MG tablet Take 81 mg by mouth daily.     Yes [provider]  chlorthalidone (HYGROTON) 25 MG tablet Take 1 tablet (25 mg total) by mouth daily. 08/17/15  Yes Duke Salvia, MD  zolpidem (AMBIEN) 10 MG tablet Take 10 mg by mouth at bedtime.    Yes [provider]    Allergies    Patient has no known allergies.  Review of Systems   Review of Systems  All other systems reviewed and are negative.   Physical Exam Updated Vital Signs BP 106/69   Pulse 90   Temp 98.4 F (36.9 C) (Oral)   Resp 18   Ht 1.626 m (5\' 4" )   Wt 81.6 kg   SpO2 96%   BMI 30.90 kg/m   Physical Exam Vitals and nursing note reviewed.  Constitutional:      General: He is not in acute distress.    Appearance:  He is well-developed.  HENT:     Head: Normocephalic and atraumatic.     Mouth/Throat:     Pharynx: No oropharyngeal exudate.  Eyes:     General: No scleral icterus.       Right eye: No discharge.        Left eye: No discharge.     Conjunctiva/sclera: Conjunctivae normal.     Pupils: Pupils are equal, round, and reactive to light.  Neck:     Thyroid: No thyromegaly.     Vascular: No JVD.  Cardiovascular:     Rate and Rhythm: Regular rhythm. Tachycardia present.     Heart sounds: Normal heart sounds. No murmur heard.  No friction rub. No gallop.      Comments: Heart rate of about 105 bpm with normal pulses Pulmonary:     Effort: Pulmonary effort is normal. No respiratory distress.     Breath sounds: Normal breath sounds. No wheezing or rales.  Abdominal:     General: Bowel sounds are normal. There is no  distension.     Palpations: Abdomen is soft. There is no mass.     Tenderness: There is abdominal tenderness.     Comments: There is a fullness in the suprapubic area with some tenderness and mild guarding, no right lower quadrant tenderness no upper abdominal tenderness  Musculoskeletal:        General: No tenderness. Normal range of motion.     Cervical back: Normal range of motion and neck supple.  Lymphadenopathy:     Cervical: No cervical adenopathy.  Skin:    General: Skin is warm and dry.     Findings: No erythema or rash.  Neurological:     Mental Status: He is alert.     Coordination: Coordination normal.  Psychiatric:        Behavior: Behavior normal.     ED Results / Procedures / Treatments   Labs (all labs ordered are listed, but only abnormal results are displayed) Labs Reviewed  CBC WITH DIFFERENTIAL/PLATELET - Abnormal; Notable for the following components:      Result Value   Lymphs Abs 0.6 (*)    All other components within normal limits  BASIC METABOLIC PANEL - Abnormal; Notable for the following components:   Chloride 96 (*)    Glucose, Bld 129 (*)    Creatinine, Ser 1.29 (*)    GFR calc non Af Amer 52 (*)    All other components within normal limits  URINALYSIS, ROUTINE W REFLEX MICROSCOPIC    EKG None  Radiology DG ABD ACUTE 2+V W 1V CHEST  Result Date: 02/13/2020 CLINICAL DATA:  79 year old male with history of abdominal pain. EXAM: DG ABDOMEN ACUTE W/ 1V CHEST COMPARISON:  Abdominal radiograph 02/13/2020. FINDINGS: Lung volumes are low. No consolidative airspace disease. No pleural effusions. No suspicious appearing pulmonary nodules or masses are noted. Heart size is within normal limits. Upper mediastinal contours are normal. Atherosclerotic calcifications in the thoracic aorta. Left-sided pacemaker device in place with lead tips projecting over the expected location of the right atrium and right ventricle. Scattered areas of mild small bowel  dilatation measuring up to 3.4 cm in diameter. However, there is gas and stool noted throughout the colon and distal rectum. Relatively large amount of stool in the rectum. No pneumoperitoneum. Surgical clips project over the right upper quadrant of the abdomen, likely from prior cholecystectomy. IMPRESSION: 1. Nonspecific, nonobstructive bowel gas pattern. 2. Large volume stool in the colon, particularly in  the distal rectum, which may suggest constipation. 3. No pneumoperitoneum. 4. No radiographic evidence of acute cardiopulmonary disease. 5. Aortic atherosclerosis. Electronically Signed   By: Trudie Reed M.D.   On: 02/13/2020 11:28    Procedures Procedures (including critical care time)  Medications Ordered in ED Medications - No data to display  ED Course  I have reviewed the triage vital signs and the nursing notes.  Pertinent labs & imaging results that were available during my care of the patient were reviewed by me and considered in my medical decision making (see chart for details).  Clinical Course as of Feb 13 1144  Sun Feb 13, 2020  1058 Metabolic panel reveals a slight increase in creatinine at 1.29 compared to baseline.  BUN is at baseline, electrolytes are unremarkable and the CBC shows no leukocytosis or anemia.  Urinalysis is without infection, acute abdominal series performed   [BM]  1141 X-ray appears to show a large stool burden in the colon, the patient is likely having some ankle paresis around his large constipation.  He is passing brown stool as is evidenced by the exam.  There is no blood, his labs are unremarkable, his urinary retention may very well be related to the large stool burden.  I will have the patient continue with laxative therapy until he is completely empty in fact I may have him drink more MiraLAX, the urinary catheter will need to be stay in place for approximately 1 week will have follow-up with urology.   [BM]    Clinical Course User Index [BM]  Eber Hong, MD   MDM Rules/Calculators/A&P                          This patient has possible urinary retention, it is unclear how this is related to what he describes as diarrhea.  He states it is occasional black, it is not bloody, it is not watery, again the patient's ability to give a clear history is significantly limited, he does not appear in distress and is not febrile.  He does not have an acute abdomen.  With nurse present at bedside rectal exam without acute findings - normal tone - stool brown and hemoccult negative  500+ cc of urine in bladder - can't urinate - labs and foley ordered  Final Clinical Impression(s) / ED Diagnoses Final diagnoses:  Urinary retention  Constipation, unspecified constipation type    Rx / DC Orders ED Discharge Orders    None       Eber Hong, MD 02/13/20 1145

## 2020-02-21 ENCOUNTER — Other Ambulatory Visit: Payer: Self-pay

## 2020-02-21 ENCOUNTER — Emergency Department (HOSPITAL_COMMUNITY)
Admission: EM | Admit: 2020-02-21 | Discharge: 2020-02-21 | Disposition: A | Discharge status: Home / Self Care | Payer: Medicare HMO

## 2020-02-25 ENCOUNTER — Other Ambulatory Visit: Payer: Self-pay

## 2020-02-25 ENCOUNTER — Emergency Department (HOSPITAL_COMMUNITY): Payer: Medicare HMO

## 2020-02-25 ENCOUNTER — Encounter (HOSPITAL_COMMUNITY): Payer: Self-pay

## 2020-02-25 ENCOUNTER — Emergency Department (HOSPITAL_COMMUNITY)
Admission: EM | Admit: 2020-02-25 | Discharge: 2020-02-25 | Disposition: A | Discharge status: Home / Self Care | Payer: Medicare HMO | Attending: Emergency Medicine | Admitting: Emergency Medicine

## 2020-02-25 DIAGNOSIS — R102 Pelvic and perineal pain: Secondary | ICD-10-CM | POA: Diagnosis not present

## 2020-02-25 DIAGNOSIS — Z79899 Other long term (current) drug therapy: Secondary | ICD-10-CM | POA: Diagnosis not present

## 2020-02-25 DIAGNOSIS — K5900 Constipation, unspecified: Secondary | ICD-10-CM | POA: Insufficient documentation

## 2020-02-25 DIAGNOSIS — I10 Essential (primary) hypertension: Secondary | ICD-10-CM | POA: Insufficient documentation

## 2020-02-25 DIAGNOSIS — I2692 Saddle embolus of pulmonary artery without acute cor pulmonale: Secondary | ICD-10-CM

## 2020-02-25 DIAGNOSIS — K6289 Other specified diseases of anus and rectum: Secondary | ICD-10-CM | POA: Diagnosis present

## 2020-02-25 DIAGNOSIS — Z87898 Personal history of other specified conditions: Secondary | ICD-10-CM

## 2020-02-25 LAB — CBC WITH DIFFERENTIAL/PLATELET
Abs Immature Granulocytes: 0.06 10*3/uL (ref 0.00–0.07)
Basophils Absolute: 0 10*3/uL (ref 0.0–0.1)
Basophils Relative: 1 %
Eosinophils Absolute: 0.1 10*3/uL (ref 0.0–0.5)
Eosinophils Relative: 1 %
HCT: 42.8 % (ref 39.0–52.0)
Hemoglobin: 14.1 g/dL (ref 13.0–17.0)
Immature Granulocytes: 1 %
Lymphocytes Relative: 9 %
Lymphs Abs: 0.8 10*3/uL (ref 0.7–4.0)
MCH: 30.4 pg (ref 26.0–34.0)
MCHC: 32.9 g/dL (ref 30.0–36.0)
MCV: 92.2 fL (ref 80.0–100.0)
Monocytes Absolute: 0.8 10*3/uL (ref 0.1–1.0)
Monocytes Relative: 10 %
Neutro Abs: 6.7 10*3/uL (ref 1.7–7.7)
Neutrophils Relative %: 78 %
Platelets: 161 10*3/uL (ref 150–400)
RBC: 4.64 MIL/uL (ref 4.22–5.81)
RDW: 12.7 % (ref 11.5–15.5)
WBC: 8.5 10*3/uL (ref 4.0–10.5)
nRBC: 0 % (ref 0.0–0.2)

## 2020-02-25 LAB — COMPREHENSIVE METABOLIC PANEL
ALT: 21 U/L (ref 0–44)
AST: 24 U/L (ref 15–41)
Albumin: 3.7 g/dL (ref 3.5–5.0)
Alkaline Phosphatase: 58 U/L (ref 38–126)
Anion gap: 14 (ref 5–15)
BUN: 34 mg/dL — ABNORMAL HIGH (ref 8–23)
CO2: 28 mmol/L (ref 22–32)
Calcium: 9 mg/dL (ref 8.9–10.3)
Chloride: 94 mmol/L — ABNORMAL LOW (ref 98–111)
Creatinine, Ser: 1.11 mg/dL (ref 0.61–1.24)
GFR calc Af Amer: >60 mL/min (ref >60)
GFR calc non Af Amer: >60 mL/min (ref >60)
Glucose, Bld: 115 mg/dL — ABNORMAL HIGH (ref 70–99)
Potassium: 2.8 mmol/L — ABNORMAL LOW (ref 3.5–5.1)
Sodium: 136 mmol/L (ref 135–145)
Total Bilirubin: 2.3 mg/dL — ABNORMAL HIGH (ref 0.3–1.2)
Total Protein: 7.1 g/dL (ref 6.5–8.1)

## 2020-02-25 LAB — LIPASE, BLOOD: Lipase: 28 U/L (ref 11–51)

## 2020-02-25 MED ORDER — POTASSIUM CHLORIDE CRYS ER 20 MEQ PO TBCR
40.0000 meq | EXTENDED_RELEASE_TABLET | Freq: Once | ORAL | Status: AC
  Administered 2020-02-25: 40 meq via ORAL
  Filled 2020-02-25: qty 2

## 2020-02-25 MED ORDER — HEPARIN SODIUM (PORCINE) 5000 UNIT/ML IJ SOLN: 4000.0000 [IU] | Freq: Once | INTRAMUSCULAR | Status: DC

## 2020-02-25 MED ORDER — IOHEXOL 350 MG/ML SOLN
75.0000 mL | Freq: Once | INTRAVENOUS | Status: AC | PRN
  Administered 2020-02-25: 75 mL via INTRAVENOUS

## 2020-02-25 MED ORDER — HEPARIN (PORCINE) 25000 UT/250ML-% IV SOLN: 14.0000 [IU]/kg/h | INTRAVENOUS | Status: DC

## 2020-02-25 MED ORDER — IOHEXOL 300 MG/ML  SOLN
100.0000 mL | Freq: Once | INTRAMUSCULAR | Status: AC | PRN
  Administered 2020-02-25: 100 mL via INTRAVENOUS

## 2020-02-25 MED ORDER — SODIUM CHLORIDE 0.9 % IV SOLN: INTRAVENOUS | Status: DC

## 2020-02-25 NOTE — ED Provider Notes (Signed)
[facility] EMERGENCY DEPARTMENT Provider Note   CSN: [id number] Arrival date & time: 02/25/20  1213     History Chief Complaint  Patient presents with  . Rectal Pain    Luke Crosby is a 79 y.o. male.  Patient with a complaint of pelvic pain.  Mostly around the rectal area.  But he states that secondary to the Foley catheter.  Every section say put that in August 22 for urinary retention he has had discomfort.  Is also not able to have a bowel movement.  When evaluated in August 22 he did have plain films of his abdomen which did show moderate constipation.  But any determined that he had bladder distention had Foley catheter placed.  He was post to follow-up with urology here in [place] but I think patient was confused about that and he went back to [facility] to get the Foley catheter removed but the wait was too long so he left.  Patient most wants the Foley catheter out he says 1 sets out his pain will go away.  He states he is urinating fine he does not seem to understand why the Foley catheter was put in.  Otherwise patient is very alert and oriented and seems to be of sound mind.        Past Medical History:  Diagnosis Date  . Altered mental status 05/29/2015  . Hypertension   . Sinus node dysfunction [facility])     Patient Active Problem List   Diagnosis Date Noted  . TIA (transient ischemic attack) 05/30/2015  . Altered mental status 05/29/2015  . Elevated blood pressure 06/03/2013  . 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 Syncope 04/26/2011  . Atrial fibrillation (HCC) 04/12/2010  . Sinoatrial node dysfunction (HCC) 04/14/2009    Past Surgical History:  Procedure Laterality Date  . INSERT / REPLACE / REMOVE PACEMAKER  ~ 2009   Medtronic Versa VEDro1  . LAPAROSCOPIC CHOLECYSTECTOMY         Family History  Problem Relation Age of Onset  . Hypertension Sister     Social History   Tobacco Use  . Smoking status: Never Smoker  . Smokeless tobacco: Former  Neurosurgeon    Types: Chew  . Tobacco comment: 05/30/2015 "chewed tobacco for 40 yrs; quit early 2000's"  Substance Use Topics  . Alcohol use: No  . Drug use: No    Home Medications Prior to Admission medications   Medication Sig Start Date End Date Taking? Authorizing Provider  aspirin 81 MG tablet Take 81 mg by mouth daily.      [provider]  chlorthalidone (HYGROTON) 25 MG tablet Take 1 tablet (25 mg total) by mouth daily. 08/17/15   Duke Salvia, MD  zolpidem (AMBIEN) 10 MG tablet Take 10 mg by mouth at bedtime.     [provider]    Allergies    Patient has no known allergies.  Review of Systems   Review of Systems  Constitutional: Negative for chills and fever.  HENT: Negative for congestion, rhinorrhea and sore throat.   Eyes: Negative for visual disturbance.  Respiratory: Negative for cough and shortness of breath.   Cardiovascular: Negative for chest pain and leg swelling.  Gastrointestinal: Positive for abdominal pain. Negative for diarrhea, nausea and vomiting.  Genitourinary: Negative for dysuria.  Musculoskeletal: Negative for back pain and neck pain.  Skin: Negative for rash.  Neurological: Negative for dizziness, light-headedness and headaches.  Hematological: Does not bruise/bleed easily.  Psychiatric/Behavioral: Negative for confusion.  Physical Exam Updated Vital Signs BP 113/71 (BP Location: Right Arm)   Pulse 86   Temp 98.4 F (36.9 C) (Oral)   Resp 18   Ht 1.626 m (5\' 4" )   Wt 81.6 kg   SpO2 95%   BMI 30.90 kg/m   Physical Exam Vitals and nursing note reviewed.  Constitutional:      General: He is not in acute distress.    Appearance: Normal appearance. He is well-developed.  HENT:     Head: Normocephalic and atraumatic.  Eyes:     Conjunctiva/sclera: Conjunctivae normal.     Pupils: Pupils are equal, round, and reactive to light.  Cardiovascular:     Rate and Rhythm: Normal rate and regular rhythm.     Heart sounds:  No murmur heard.   Pulmonary:     Effort: Pulmonary effort is normal. No respiratory distress.     Breath sounds: Normal breath sounds.  Abdominal:     Palpations: Abdomen is soft.     Tenderness: There is no abdominal tenderness.  Genitourinary:    Penis: Normal.      Testes: Normal.  Musculoskeletal:        General: No swelling.     Cervical back: Neck supple.  Skin:    General: Skin is warm and dry.  Neurological:     General: No focal deficit present.     Mental Status: He is alert and oriented to person, place, and time.     Cranial Nerves: No cranial nerve deficit.     Sensory: No sensory deficit.     Motor: No weakness.     ED Results / Procedures / Treatments   Labs (all labs ordered are listed, but only abnormal results are displayed) Labs Reviewed - No data to display  EKG None  Radiology No results found.  Procedures Procedures (including critical care time)  CRITICAL CARE Performed by: Total critical care time:45 minutes Critical care time was exclusive of separately billable procedures and treating other patients. Critical care was necessary to treat or prevent imminent or life-threatening deterioration. Critical care was time spent personally by me on the following activities: development of treatment plan with patient and/or surrogate as well as nursing, discussions with consultants, evaluation of patient's response to treatment, examination of patient, obtaining history from patient or surrogate, ordering and performing treatments and interventions, ordering and review of laboratory studies, ordering and review of radiographic studies, pulse oximetry and re-evaluation of patient's condition.   Medications Ordered in ED Medications - No data to display  ED Course  I have reviewed the triage vital signs and the nursing notes.  Pertinent labs & imaging results that were available during my care of the patient were reviewed by me and  considered in my medical decision making (see chart for details).    MDM Rules/Calculators/A&P                            Patient insisted on having Foley catheter removed.  Would not allow the rest of the work-up.  I was concerned that maybe his pain was related to something else based on the plain films that were done on August 22 he had a lot of stool retention.  Patient did not have any nausea or vomiting.  Abdomen was nontender.  Genitourinary exam was normal with the Foley catheter in place in the leg bag.  We removed the Foley catheter.  And  he stated that all his discomfort went away.  But he did agree to get CT scan of the abdomen and to get lab work.  CT scan of the abdomen raise concerns for bilateral pulmonary embolus.  So CT angio was ordered.  And shows marked bilateral pulmonary embolus with saddle embolus.  But no right heart strain.  Discussed with radiology.  They recommended echocardiogram.  Ordered heparin and was also do Covid testing patient truly asymptomatic no chest pain no shortness of breath oxygen sats on room air were in the upper 90s.  Heart rate was in the in the 90s.  But not tachycardic.  And did not seem to be short of breath or having any respiratory difficulties.  Patient absolutely refused to the admission refused heparin or treatment.  Patient stated that he was going to go home.  Made it very clear that this was extremely life-threatening and what needed to be done need to be treated in the hospital and he needed to be observed.  Patient said he is going to choose when he wants to die.  And he was not going to be admitted.  He appreciated that we took the Foley catheter out which seem to be his main objective for coming in the first place.  In addition his potassium is slightly low at 2.8.  He was given 40 mEq of potassium here.  Patient did not wait for potassium description.  His renal function was normal.  Patient is of sound mind and can make up his own  decisions.  He seemed to clearly understand the life-threatening nature.  Both myself and the nurse were unable to convince him to stay and it was quite clear with both of [place] talking to him that he understood the danger of the situation. Final Clinical Impression(s) / ED Diagnoses Final diagnoses:  None    Rx / DC Orders ED Discharge Orders    None       Vanetta Mulders, MD 02/25/20 2047

## 2020-02-25 NOTE — ED Triage Notes (Signed)
Pt to er, pt states that he hasn't had a bm in the past 6 days.  States that he has been having pain.  Pt states that his foley is draining appropriately.

## 2020-03-05 ENCOUNTER — Emergency Department (HOSPITAL_COMMUNITY)
Admission: EM | Admit: 2020-03-05 | Discharge: 2020-03-05 | Disposition: A | Discharge status: Home / Self Care | Payer: Medicare HMO | Source: Home / Self Care

## 2020-03-05 ENCOUNTER — Other Ambulatory Visit: Payer: Self-pay

## 2020-03-05 ENCOUNTER — Encounter (HOSPITAL_COMMUNITY): Payer: Self-pay | Admitting: Emergency Medicine

## 2020-03-05 DIAGNOSIS — K5900 Constipation, unspecified: Secondary | ICD-10-CM | POA: Insufficient documentation

## 2020-03-05 DIAGNOSIS — K6289 Other specified diseases of anus and rectum: Secondary | ICD-10-CM | POA: Insufficient documentation

## 2020-03-05 DIAGNOSIS — I2699 Other pulmonary embolism without acute cor pulmonale: Secondary | ICD-10-CM | POA: Diagnosis not present

## 2020-03-05 DIAGNOSIS — Z5321 Procedure and treatment not carried out due to patient leaving prior to being seen by health care provider: Secondary | ICD-10-CM | POA: Insufficient documentation

## 2020-03-05 NOTE — ED Triage Notes (Addendum)
Pt c/o rectal pain that worsens when he tries to sit down x 1 week. Denies hx of hemorrhoids or rectal bleeding. States "it feels like its coming out of the wrong place." Pt unable to elaborate any further about what felt "out of place." Also c/o constipation. Pt denies abdominal pain.

## 2020-03-06 ENCOUNTER — Emergency Department (HOSPITAL_COMMUNITY): Payer: Medicare HMO

## 2020-03-06 ENCOUNTER — Ambulatory Visit: Payer: Medicare HMO | Admitting: Urology

## 2020-03-06 ENCOUNTER — Other Ambulatory Visit: Payer: Self-pay

## 2020-03-06 ENCOUNTER — Encounter (HOSPITAL_COMMUNITY): Payer: Self-pay

## 2020-03-06 ENCOUNTER — Inpatient Hospital Stay (HOSPITAL_COMMUNITY)
Admission: EM | Admit: 2020-03-06 | Discharge: 2020-03-09 | DRG: 176 | Disposition: A | Discharge status: Home / Self Care | Payer: Medicare HMO | Attending: Internal Medicine | Admitting: Internal Medicine

## 2020-03-06 DIAGNOSIS — I10 Essential (primary) hypertension: Secondary | ICD-10-CM

## 2020-03-06 DIAGNOSIS — I4891 Unspecified atrial fibrillation: Secondary | ICD-10-CM | POA: Diagnosis present

## 2020-03-06 DIAGNOSIS — Z9049 Acquired absence of other specified parts of digestive tract: Secondary | ICD-10-CM

## 2020-03-06 DIAGNOSIS — R7989 Other specified abnormal findings of blood chemistry: Secondary | ICD-10-CM

## 2020-03-06 DIAGNOSIS — I82411 Acute embolism and thrombosis of right femoral vein: Secondary | ICD-10-CM | POA: Diagnosis present

## 2020-03-06 DIAGNOSIS — K5900 Constipation, unspecified: Secondary | ICD-10-CM

## 2020-03-06 DIAGNOSIS — K6289 Other specified diseases of anus and rectum: Secondary | ICD-10-CM | POA: Diagnosis present

## 2020-03-06 DIAGNOSIS — Z950 Presence of cardiac pacemaker: Secondary | ICD-10-CM

## 2020-03-06 DIAGNOSIS — D72829 Elevated white blood cell count, unspecified: Secondary | ICD-10-CM

## 2020-03-06 DIAGNOSIS — I2699 Other pulmonary embolism without acute cor pulmonale: Secondary | ICD-10-CM | POA: Diagnosis not present

## 2020-03-06 DIAGNOSIS — N182 Chronic kidney disease, stage 2 (mild): Secondary | ICD-10-CM | POA: Diagnosis present

## 2020-03-06 DIAGNOSIS — R17 Unspecified jaundice: Secondary | ICD-10-CM

## 2020-03-06 DIAGNOSIS — I82441 Acute embolism and thrombosis of right tibial vein: Secondary | ICD-10-CM | POA: Diagnosis present

## 2020-03-06 DIAGNOSIS — R799 Abnormal finding of blood chemistry, unspecified: Secondary | ICD-10-CM

## 2020-03-06 DIAGNOSIS — B9620 Unspecified Escherichia coli [E. coli] as the cause of diseases classified elsewhere: Secondary | ICD-10-CM | POA: Diagnosis present

## 2020-03-06 DIAGNOSIS — E876 Hypokalemia: Secondary | ICD-10-CM

## 2020-03-06 DIAGNOSIS — Z20822 Contact with and (suspected) exposure to covid-19: Secondary | ICD-10-CM | POA: Diagnosis present

## 2020-03-06 DIAGNOSIS — N390 Urinary tract infection, site not specified: Secondary | ICD-10-CM

## 2020-03-06 DIAGNOSIS — N3000 Acute cystitis without hematuria: Secondary | ICD-10-CM

## 2020-03-06 DIAGNOSIS — I82409 Acute embolism and thrombosis of unspecified deep veins of unspecified lower extremity: Secondary | ICD-10-CM

## 2020-03-06 DIAGNOSIS — Z87891 Personal history of nicotine dependence: Secondary | ICD-10-CM

## 2020-03-06 DIAGNOSIS — N179 Acute kidney failure, unspecified: Secondary | ICD-10-CM | POA: Diagnosis present

## 2020-03-06 DIAGNOSIS — E860 Dehydration: Secondary | ICD-10-CM

## 2020-03-06 DIAGNOSIS — I129 Hypertensive chronic kidney disease with stage 1 through stage 4 chronic kidney disease, or unspecified chronic kidney disease: Secondary | ICD-10-CM | POA: Diagnosis present

## 2020-03-06 LAB — URINALYSIS, ROUTINE W REFLEX MICROSCOPIC
Bilirubin Urine: NEGATIVE
Glucose, UA: NEGATIVE mg/dL
Ketones, ur: NEGATIVE mg/dL
Nitrite: NEGATIVE
Protein, ur: 30 mg/dL — AB
Specific Gravity, Urine: 1.016 (ref 1.005–1.030)
WBC, UA: >50 WBC/hpf — ABNORMAL HIGH (ref 0–5)
pH: 6 (ref 5.0–8.0)

## 2020-03-06 LAB — SARS CORONAVIRUS 2 BY RT PCR (HOSPITAL ORDER, PERFORMED IN ~~LOC~~ HOSPITAL LAB): SARS Coronavirus 2: NEGATIVE

## 2020-03-06 LAB — COMPREHENSIVE METABOLIC PANEL
ALT: 40 U/L (ref 0–44)
AST: 41 U/L (ref 15–41)
Albumin: 4 g/dL (ref 3.5–5.0)
Alkaline Phosphatase: 60 U/L (ref 38–126)
Anion gap: 14 (ref 5–15)
BUN: 39 mg/dL — ABNORMAL HIGH (ref 8–23)
CO2: 30 mmol/L (ref 22–32)
Calcium: 9.7 mg/dL (ref 8.9–10.3)
Chloride: 93 mmol/L — ABNORMAL LOW (ref 98–111)
Creatinine, Ser: 1.5 mg/dL — ABNORMAL HIGH (ref 0.61–1.24)
GFR calc Af Amer: 51 mL/min — ABNORMAL LOW (ref >60)
GFR calc non Af Amer: 44 mL/min — ABNORMAL LOW (ref >60)
Glucose, Bld: 151 mg/dL — ABNORMAL HIGH (ref 70–99)
Potassium: 2.7 mmol/L — CL (ref 3.5–5.1)
Sodium: 137 mmol/L (ref 135–145)
Total Bilirubin: 3.2 mg/dL — ABNORMAL HIGH (ref 0.3–1.2)
Total Protein: 7.4 g/dL (ref 6.5–8.1)

## 2020-03-06 LAB — CBC WITH DIFFERENTIAL/PLATELET
Abs Immature Granulocytes: 0.13 10*3/uL — ABNORMAL HIGH (ref 0.00–0.07)
Basophils Absolute: 0 10*3/uL (ref 0.0–0.1)
Basophils Relative: 0 %
Eosinophils Absolute: 0 10*3/uL (ref 0.0–0.5)
Eosinophils Relative: 0 %
HCT: 46.1 % (ref 39.0–52.0)
Hemoglobin: 15.1 g/dL (ref 13.0–17.0)
Immature Granulocytes: 1 %
Lymphocytes Relative: 5 %
Lymphs Abs: 0.7 10*3/uL (ref 0.7–4.0)
MCH: 30 pg (ref 26.0–34.0)
MCHC: 32.8 g/dL (ref 30.0–36.0)
MCV: 91.5 fL (ref 80.0–100.0)
Monocytes Absolute: 0.8 10*3/uL (ref 0.1–1.0)
Monocytes Relative: 6 %
Neutro Abs: 12.2 10*3/uL — ABNORMAL HIGH (ref 1.7–7.7)
Neutrophils Relative %: 88 %
Platelets: 235 10*3/uL (ref 150–400)
RBC: 5.04 MIL/uL (ref 4.22–5.81)
RDW: 13 % (ref 11.5–15.5)
WBC: 13.9 10*3/uL — ABNORMAL HIGH (ref 4.0–10.5)
nRBC: 0 % (ref 0.0–0.2)

## 2020-03-06 LAB — TROPONIN I (HIGH SENSITIVITY)
Troponin I (High Sensitivity): 11 ng/L (ref <18)
Troponin I (High Sensitivity): 12 ng/L (ref <18)

## 2020-03-06 LAB — LIPASE, BLOOD: Lipase: 35 U/L (ref 11–51)

## 2020-03-06 LAB — PROTIME-INR
INR: 1.2 (ref 0.8–1.2)
Prothrombin Time: 14.5 seconds (ref 11.4–15.2)

## 2020-03-06 MED ORDER — HEPARIN (PORCINE) 25000 UT/250ML-% IV SOLN
1350.0000 [IU]/h | INTRAVENOUS | Status: DC
  Administered 2020-03-06 – 2020-03-08 (×3): 1200 [IU]/h via INTRAVENOUS
  Filled 2020-03-06 (×3): qty 250

## 2020-03-06 MED ORDER — SODIUM CHLORIDE 0.9 % IV BOLUS
1000.0000 mL | Freq: Once | INTRAVENOUS | Status: AC
  Administered 2020-03-06: 1000 mL via INTRAVENOUS

## 2020-03-06 MED ORDER — POTASSIUM CHLORIDE CRYS ER 20 MEQ PO TBCR
40.0000 meq | EXTENDED_RELEASE_TABLET | Freq: Once | ORAL | Status: AC
  Administered 2020-03-06: 40 meq via ORAL
  Filled 2020-03-06: qty 2

## 2020-03-06 MED ORDER — SODIUM CHLORIDE 0.9 % IV SOLN: Freq: Once | INTRAVENOUS | Status: AC

## 2020-03-06 MED ORDER — MORPHINE SULFATE (PF) 4 MG/ML IV SOLN
4.0000 mg | Freq: Once | INTRAVENOUS | Status: AC
  Administered 2020-03-06: 4 mg via INTRAVENOUS
  Filled 2020-03-06: qty 1

## 2020-03-06 MED ORDER — DOCUSATE SODIUM 50 MG/5ML PO LIQD
50.0000 mg | Freq: Every day | ORAL | Status: DC
  Administered 2020-03-07: 50 mg via ORAL
  Filled 2020-03-06 (×5): qty 10

## 2020-03-06 MED ORDER — SODIUM CHLORIDE 0.9 % IV SOLN
1.0000 g | INTRAVENOUS | Status: DC
  Administered 2020-03-07 – 2020-03-08 (×2): 1 g via INTRAVENOUS
  Filled 2020-03-06 (×3): qty 10

## 2020-03-06 MED ORDER — ONDANSETRON HCL 4 MG/2ML IJ SOLN
4.0000 mg | Freq: Once | INTRAMUSCULAR | Status: AC
  Administered 2020-03-06: 4 mg via INTRAVENOUS
  Filled 2020-03-06: qty 2

## 2020-03-06 MED ORDER — POLYETHYLENE GLYCOL 3350 17 G PO PACK
17.0000 g | PACK | Freq: Every day | ORAL | Status: DC
  Administered 2020-03-07: 17 g via ORAL
  Filled 2020-03-06 (×3): qty 1

## 2020-03-06 MED ORDER — POLYETHYLENE GLYCOL 3350 17 G PO PACK
17.0000 g | PACK | Freq: Once | ORAL | Status: AC
  Administered 2020-03-06: 17 g via ORAL
  Filled 2020-03-06: qty 1

## 2020-03-06 MED ORDER — SODIUM CHLORIDE 0.9 % IV SOLN
1.0000 g | Freq: Once | INTRAVENOUS | Status: AC
  Administered 2020-03-06: 1 g via INTRAVENOUS
  Filled 2020-03-06: qty 10

## 2020-03-06 MED ORDER — HEPARIN BOLUS VIA INFUSION
4000.0000 [IU] | Freq: Once | INTRAVENOUS | Status: AC
  Administered 2020-03-06: 4000 [IU] via INTRAVENOUS

## 2020-03-06 MED ORDER — ZOLPIDEM TARTRATE 5 MG PO TABS
5.0000 mg | ORAL_TABLET | Freq: Every day | ORAL | Status: DC
  Administered 2020-03-06 – 2020-03-08 (×3): 5 mg via ORAL
  Filled 2020-03-06 (×3): qty 1

## 2020-03-06 MED ORDER — IOHEXOL 350 MG/ML SOLN
100.0000 mL | Freq: Once | INTRAVENOUS | Status: AC | PRN
  Administered 2020-03-06: 100 mL via INTRAVENOUS

## 2020-03-06 MED ORDER — POTASSIUM CHLORIDE 10 MEQ/100ML IV SOLN
10.0000 meq | Freq: Once | INTRAVENOUS | Status: AC
  Administered 2020-03-06: 10 meq via INTRAVENOUS
  Filled 2020-03-06: qty 100

## 2020-03-06 NOTE — ED Notes (Signed)
Disimpacted moderate amt of brown stool pt per order from hospitalist, pt tolerated well

## 2020-03-06 NOTE — Progress Notes (Signed)
ANTICOAGULATION CONSULT NOTE - Initial Consult  Pharmacy Consult for heparin gtt  Indication: pulmonary embolus  No Known Allergies  Patient Measurements: Height: 5\' 4"  (162.6 cm) Weight: 81.6 kg (180 lb) IBW/kg (Calculated) : 59.2 Heparin Dosing Weight: HEPARIN DW (KG): 76.3   Vital Signs: Temp: 98.3 F (36.8 C) (09/13 0946) Temp Source: Oral (09/13 0946) BP: 95/72 (09/13 1800) Pulse Rate: 84 (09/13 1730)  Labs: Recent Labs    03/06/20 1325  HGB 15.1  HCT 46.1  PLT 235  LABPROT 14.5  INR 1.2  CREATININE 1.50*    Estimated Creatinine Clearance: 38.5 mL/min (A) (by C-G formula based on SCr of 1.5 mg/dL (H)).   Medical History: Past Medical History:  Diagnosis Date  . Altered mental status 05/29/2015  . Hypertension   . Sinus node dysfunction (HCC)     Medications:  (Not in a hospital admission)  Scheduled:  . heparin  4,000 Units Intravenous Once  . polyethylene glycol  17 g Oral Once   Infusions:  . heparin    . sodium chloride     PRN:  Anti-infectives (From admission, onward)   Start     Dose/Rate Route Frequency Ordered Stop   03/06/20 1800  cefTRIAXone (ROCEPHIN) 1 g in sodium chloride 0.9 % 100 mL IVPB        1 g 200 mL/hr over 30 Minutes Intravenous  Once 03/06/20 1758 03/06/20 1906      Assessment: Luke Crosby a 79 y.o. male requires anticoagulation with a heparin iv infusion for the indication of  pulmonary embolus. Heparin gtt will be started following pharmacy protocol per pharmacy consult. Patient is not on previous oral anticoagulant that will require aPTT/HL correlation before transitioning to only HL monitoring.   Goal of Therapy:  Heparin level 0.3-0.7 units/ml Monitor platelets by anticoagulation protocol: Yes   Plan:  Give 4000 units bolus x 1 Start heparin infusion at 1200 units/hr Check anti-Xa level in 8 hours and daily while on heparin Continue to monitor H&H and platelets  Heparin level to be drawn in 8 hours for  patients >42 years old or crcl < 90ml/min  31m [name] 03/06/2020,7:30 PM

## 2020-03-06 NOTE — H&P (Signed)
History and Physical  Luke Hockeyhomas M Barish ZOX:[id number] DOB: April 01, 1941 DOA: 03/06/2020  Referring physician: Kathyrn LassAlbrizze, Kaitlyn E, PA-C PCP: Gareth MorganKnowlton, [name], MD  Patient coming from: Home  Chief Complaint: Rectal Pain  HPI: Luke Crosby is a 79 y.o. male with medical history significant for atrial fibrillation on pacemaker (not on anticoagulation) and hypertension who presents to the emergency department due to 1 week of rectal pain, patient complained of constipation with unknown time of last bowel movement.  He denies abdominal pain,, abdominal distention, though he endorsed decreased appetite within same time period.  Patient also complained of painful urination, he was seen in the ED about 10 days ago with complaint of urinary retention, at that time, he had a Foley catheter that was removed with subsequent pain relief, CT angiography of the chest done at that time showed bilateral pulmonary embolus without right heart strain, patient refused being admitted for treatment at that time and he left AMA. ED PA states that she called son over the phone for further information regarding the patient and it was reported that he seemed to be having more difficulty getting around and sometimes appears confused and patient was said to feel short of breath at home today, so, son insisted that he should go to ED for further evaluation and management.  Per ED medical record, son denies any history of fall and patient was not on any blood thinners at home.  At bedside, patient denies any confusion and shortness of breath.  ED Course:  In the emergency department, he was hemodynamically stable.  Work-up in the ED showed leukocytosis, hypokalemia, BUN/creatinine 29/1.50 (range within 1.1-1.3 within last 3 weeks).  Total bilirubin 3.2.  Troponin x 2 was negative, lipase level was normal.  Urinalysis was positive for large leukocytes and WBC >50.  CT angiography of chest with contrast showed multiple bilateral acute  pulmonary emboli with no CT evidence of right heart strain.  Large stool within the rectal vault which appears progressive since prior exam raising the question of fecal impaction was also noted.  Fecal disimpaction was attempted per ED PA without success and patient was treated with MiraLAX and enema.  IV ceftriaxone due to presumed UTI was given.  Patient was started on IV heparin drip, potassium was replenished and IV hydration was provided.  Hospitalist was asked to admit patient for further evaluation and management.  Review of Systems: Constitutional: Negative for chills and fever.  HENT: Negative for ear pain and sore throat.   Eyes: Negative for pain and visual disturbance.  Respiratory: Negative for cough, chest tightness and shortness of breath.   Cardiovascular: Negative for chest pain and palpitations.  Gastrointestinal: Positive for constipation.  Negative for abdominal pain and vomiting.  Endocrine: Negative for polyphagia and polyuria.  Genitourinary: Positive for painful urination.  Negative for decreased urine volume Musculoskeletal: Negative for arthralgias and back pain.  Skin: Negative for color change and rash.  Allergic/Immunologic: Negative for immunocompromised state.  Neurological: Negative for tremors, syncope, speech difficulty, weakness, light-headedness and headaches.  Hematological: Does not bruise/bleed easily.  All other systems reviewed and are negative   Past Medical History:  Diagnosis Date  . Altered mental status 05/29/2015  . Hypertension   . Sinus node dysfunction [facility](HCC)    Past Surgical History:  Procedure Laterality Date  . INSERT / REPLACE / REMOVE PACEMAKER  ~ 2009   [employer] Versa VEDro1  . LAPAROSCOPIC CHOLECYSTECTOMY      Social History:  reports that he has  never smoked. He has quit using smokeless tobacco.  His smokeless tobacco use included chew. He reports that he does not drink alcohol and does not use drugs.   No Known  Allergies  Family History  Problem Relation Age of Onset  . Hypertension Sister    Prior to Admission medications   Medication Sig Start Date End Date Taking? Authorizing Provider  aspirin 81 MG tablet Take 81 mg by mouth daily.     Yes [provider]  chlorthalidone (HYGROTON) 25 MG tablet Take 1 tablet (25 mg total) by mouth daily. 08/17/15  Yes Duke Salvia, MD  zolpidem (AMBIEN) 10 MG tablet Take 10 mg by mouth at bedtime.    Yes [provider]    Physical Exam: BP 95/72   Pulse 84   Temp 98.3 F (36.8 C) (Oral)   Resp 15   Ht 5\' 4"  (1.626 m)   Wt 81.6 kg   SpO2 94%   BMI 30.90 kg/m   . General: 79 y.o. year-old male well developed well nourished in no acute distress.  Alert and oriented x3. 76 HEENT: Dry mucous membrane.  NCAT, EOMI . Neck: Supple, trachea medial . Cardiovascular: Irregularly irregular rate and rhythm with no rubs or gallops.  No thyromegaly or JVD noted.  No lower extremity edema. 2/4 pulses in all 4 extremities. Marland Kitchen Respiratory: Clear to auscultation with no wheezes or rales. Good inspiratory effort. . Abdomen: Soft nontender nondistended with normal bowel sounds x4 quadrants. . Muskuloskeletal: No cyanosis, clubbing or edema noted bilaterally . Neuro: CN II-XII intact, strength, sensation, reflexes . Skin: No ulcerative lesions noted or rashes . Psychiatry: Judgement and insight appear normal. Mood is appropriate for condition and setting          Labs on Admission:  Basic Metabolic Panel: Recent Labs  Lab 03/06/20 1325  NA 137  K 2.7*  CL 93*  CO2 30  GLUCOSE 151*  BUN 39*  CREATININE 1.50*  CALCIUM 9.7   Liver Function Tests: Recent Labs  Lab 03/06/20 1325  AST 41  ALT 40  ALKPHOS 60  BILITOT 3.2*  PROT 7.4  ALBUMIN 4.0   Recent Labs  Lab 03/06/20 1325  LIPASE 35   No results for input(s): AMMONIA in the last 168 hours. CBC: Recent Labs  Lab 03/06/20 1325  WBC 13.9*  NEUTROABS 12.2*  HGB 15.1   HCT 46.1  MCV 91.5  PLT 235   Cardiac Enzymes: No results for input(s): CKTOTAL, CKMB, CKMBINDEX, TROPONINI in the last 168 hours.  BNP (last 3 results) No results for input(s): BNP in the last 8760 hours.  ProBNP (last 3 results) No results for input(s): PROBNP in the last 8760 hours.  CBG: No results for input(s): GLUCAP in the last 168 hours.  Radiological Exams on Admission: CT Angio Chest PE W and/or Wo Contrast  Result Date: 03/06/2020 CLINICAL DATA:  High probability pulmonary embolism, prior pulmonary embolism, rectal pain EXAM: CT ANGIOGRAPHY CHEST CT ABDOMEN AND PELVIS WITH CONTRAST TECHNIQUE: Multidetector CT imaging of the chest was performed using the standard protocol during bolus administration of intravenous contrast. Multiplanar CT image reconstructions and MIPs were obtained to evaluate the vascular anatomy. Multidetector CT imaging of the abdomen and pelvis was performed using the standard protocol during bolus administration of intravenous contrast. CONTRAST:  03/08/2020 OMNIPAQUE IOHEXOL 350 MG/ML SOLN COMPARISON:  02/25/2020 FINDINGS: CTA CHEST FINDINGS Cardiovascular: There are multiple acute pulmonary emboli again identified bilaterally. Since the prior examination, thrombus  has decreased in volume, in keeping with dissolution under anticoagulation, though moderate embolic burden remains, particularly within the left lower lobe. The central pulmonary arteries are of normal caliber. Mild right ventricular dilation is unchanged without CT evidence of right heart strain as the right left ventricles are of similar caliber and there is no shift of the intraventricular septum. Pacemaker leads are again seen within the right heart. Mild coronary artery calcification is again noted. No pericardial effusion. Thoracic aorta is ectatic in its descending segment, unchanged measuring 3.6 cm. Mediastinum/Nodes: No pathologic thoracic adenopathy. Esophagus unremarkable. Thyroid unremarkable  peer Lungs/Pleura: Lungs are clear. No pleural effusion or pneumothorax. Musculoskeletal: No chest wall abnormality. No acute or significant osseous findings. Review of the MIP images confirms the above findings. CT ABDOMEN and PELVIS FINDINGS Hepatobiliary: Cholecystectomy has been performed. Mild intra and extrahepatic biliary ductal dilation is unchanged, likely representing post cholecystectomy change. Rim calcified 15 mm right subhepatic soft tissue nodules is unchanged, possibly representing postsurgical change related to cholecystectomy. The liver is otherwise unremarkable. Pancreas: Unremarkable Spleen: Unremarkable Adrenals/Urinary Tract: The adrenal glands are unremarkable. Multiple simple cortical cysts are seen bilaterally. A 5 mm nonobstructing calculus is again seen within the lower pole of the left kidney. The kidneys are otherwise unremarkable. The bladder is circumferentially thick walled and there is mild perivesicular inflammatory stranding, unchanged from prior examination. Small bladder diverticulum again identified. Together, the findings suggest changes of at least mild bladder outlet obstruction, though the bladder is not distended. Stomach/Bowel: Stomach, small bowel, are unremarkable. There is large stool again seen within the rectal vault which appears progressive since prior examination raising the question of fecal impaction. There is severe sigmoid diverticulosis without superimposed inflammatory change. Appendix normal. No free intraperitoneal gas or fluid. Vascular/Lymphatic: Mild aortoiliac atherosclerotic calcification without evidence of aneurysm. No pathologic adenopathy within the abdomen and pelvis. Reproductive: The prostate gland is mildly enlarged. Other: None significant Musculoskeletal: Advanced degenerative changes are noted within the lumbar spine. No acute bone abnormality. Review of the MIP images confirms the above findings. IMPRESSION: 1. Multiple acute pulmonary  emboli again identified bilaterally. Since the prior examination, thrombus has decreased in volume, in keeping with dissolution under anticoagulation, though moderate embolic burden remains, particularly within the left lower lobe. No CT evidence of right heart strain. 2. Large stool within the rectal vault which appears progressive since prior examination, raising the question of fecal impaction. 3. Circumferentially thick walled bladder with mild perivesicular inflammatory stranding, unchanged from prior examination. Together, the findings suggest changes of at least mild bladder outlet obstruction, though the bladder is not distended. 4. Nonobstructing left renal calculus. 5. Severe sigmoid diverticulosis without superimposed inflammatory change. Aortic Atherosclerosis (ICD10-I70.0). Electronically Signed   By: Helyn Numbers MD   On: 03/06/2020 19:00   CT ABDOMEN PELVIS W CONTRAST  Result Date: 03/06/2020 CLINICAL DATA:  High probability pulmonary embolism, prior pulmonary embolism, rectal pain EXAM: CT ANGIOGRAPHY CHEST CT ABDOMEN AND PELVIS WITH CONTRAST TECHNIQUE: Multidetector CT imaging of the chest was performed using the standard protocol during bolus administration of intravenous contrast. Multiplanar CT image reconstructions and MIPs were obtained to evaluate the vascular anatomy. Multidetector CT imaging of the abdomen and pelvis was performed using the standard protocol during bolus administration of intravenous contrast. CONTRAST:  OMNIPAQUE IOHEXOL 350 MG/ML SOLN COMPARISON:  02/25/2020 FINDINGS: CTA CHEST FINDINGS Cardiovascular: There are multiple acute pulmonary emboli again identified bilaterally. Since the prior examination, thrombus has decreased in volume, in keeping with dissolution under  anticoagulation, though moderate embolic burden remains, particularly within the left lower lobe. The central pulmonary arteries are of normal caliber. Mild right ventricular dilation is unchanged  without CT evidence of right heart strain as the right left ventricles are of similar caliber and there is no shift of the intraventricular septum. Pacemaker leads are again seen within the right heart. Mild coronary artery calcification is again noted. No pericardial effusion. Thoracic aorta is ectatic in its descending segment, unchanged measuring 3.6 cm. Mediastinum/Nodes: No pathologic thoracic adenopathy. Esophagus unremarkable. Thyroid unremarkable peer Lungs/Pleura: Lungs are clear. No pleural effusion or pneumothorax. Musculoskeletal: No chest wall abnormality. No acute or significant osseous findings. Review of the MIP images confirms the above findings. CT ABDOMEN and PELVIS FINDINGS Hepatobiliary: Cholecystectomy has been performed. Mild intra and extrahepatic biliary ductal dilation is unchanged, likely representing post cholecystectomy change. Rim calcified 15 mm right subhepatic soft tissue nodules is unchanged, possibly representing postsurgical change related to cholecystectomy. The liver is otherwise unremarkable. Pancreas: Unremarkable Spleen: Unremarkable Adrenals/Urinary Tract: The adrenal glands are unremarkable. Multiple simple cortical cysts are seen bilaterally. A 5 mm nonobstructing calculus is again seen within the lower pole of the left kidney. The kidneys are otherwise unremarkable. The bladder is circumferentially thick walled and there is mild perivesicular inflammatory stranding, unchanged from prior examination. Small bladder diverticulum again identified. Together, the findings suggest changes of at least mild bladder outlet obstruction, though the bladder is not distended. Stomach/Bowel: Stomach, small bowel, are unremarkable. There is large stool again seen within the rectal vault which appears progressive since prior examination raising the question of fecal impaction. There is severe sigmoid diverticulosis without superimposed inflammatory change. Appendix normal. No free  intraperitoneal gas or fluid. Vascular/Lymphatic: Mild aortoiliac atherosclerotic calcification without evidence of aneurysm. No pathologic adenopathy within the abdomen and pelvis. Reproductive: The prostate gland is mildly enlarged. Other: None significant Musculoskeletal: Advanced degenerative changes are noted within the lumbar spine. No acute bone abnormality. Review of the MIP images confirms the above findings. IMPRESSION: 1. Multiple acute pulmonary emboli again identified bilaterally. Since the prior examination, thrombus has decreased in volume, in keeping with dissolution under anticoagulation, though moderate embolic burden remains, particularly within the left lower lobe. No CT evidence of right heart strain. 2. Large stool within the rectal vault which appears progressive since prior examination, raising the question of fecal impaction. 3. Circumferentially thick walled bladder with mild perivesicular inflammatory stranding, unchanged from prior examination. Together, the findings suggest changes of at least mild bladder outlet obstruction, though the bladder is not distended. 4. Nonobstructing left renal calculus. 5. Severe sigmoid diverticulosis without superimposed inflammatory change. Aortic Atherosclerosis (ICD10-I70.0). Electronically Signed   By: Helyn Numbers MD   On: 03/06/2020 19:00    EKG: I independently viewed the EKG done and my findings are as followed: A. fib with rate control and PVCs  Assessment/Plan Present on Admission: . PE (pulmonary thromboembolism) (HCC) . Atrial fibrillation (HCC)  Principal Problem:   PE (pulmonary thromboembolism) (HCC) Active Problems:   Atrial fibrillation (HCC)   Constipation   Essential hypertension   Hypokalemia   Elevated serum creatinine   Elevated BUN   Serum total bilirubin elevated   Leukocytosis   UTI (urinary tract infection)   Dehydration   Pulmonary embolism CT angiogram of chest showed multiple bilateral acute  pulmonary emboli with no CT evidence of right heart strain.   Patient has history of atrial fibrillation and was not on anticoagulation IV heparin drip was started with plan  to transition patient to DOAC in the morning Bilateral lower extremity ultrasound will be done in the morning Echocardiogram will be done in the morning  Atrial fibrillation with rate control Patient has a pacemaker IV heparin started due to PE as described above with plan to transition to DOAC in the morning  Constipation CT abdomen and pelvis was suggestive of large stool within the rectal vault with suspicion for fecal impaction. Fecal desimpaction was attempted per ED PA without success and patient was treated with enema and MiraLAX. Continue MiraLAX  Hypokalemia K+ 2.7, this was replenished  Presumed UTI POA Urinalysis was unimpressive for UTI, though patient complained of painful urination He was empirically started on IV ceftriaxone, we shall continue with same at this time Urine culture pending  Dehydration Continue IV hydration  Elevated BUN/creatinine possibly due to dehydration and diuretic effect BUN/creatinine 29/1.50 (range within 1.1-1.3 within last 3 weeks) Home Chlorthalidone will be held at this time Renally adjust medications, avoid nephrotoxic agents/dehydration/hypotension  Essential hypertension Chlorthalidone will be held at this time due to soft BP and dehydration  Leukocytosis possibly reactive vs infectious WBC 13.9; patient was afebrile Continue to monitor WBC with morning labs  Elevated total bilirubin  Total bilirubin 3.2, direct bilirubin will be checked   DVT prophylaxis: IV heparin drip  Code Status: Full code  Family Communication: None at bedside  Disposition Plan:  Patient is from:                        Home Anticipated DC to:                   Home Anticipated DC date:               1 day Anticipated DC barriers:          Patient unstable to discharge at this  time due to pulmonary embolism and currently on IV heparin drip.   Consults called: None  Admission status: Observation    Frankey Shown MD [facility]  If 7PM-7AM, please contact night-coverage [email or web address] Password [facility]  03/06/2020, 8:31 PM

## 2020-03-06 NOTE — ED Triage Notes (Signed)
Pt presents to ED c/o rectal pain x 1 week. Pt denies diarrhea, Pt unable to elaborate on symptoms but states "it feels like something is coming out of the wrong place." Pt denies abdominal pain.

## 2020-03-06 NOTE — ED Provider Notes (Signed)
[facility] EMERGENCY DEPARTMENT Provider Note   CSN: [id number] Arrival date & time: 03/06/20  0900     History Chief Complaint  Patient presents with   Rectal Pain    TEJA JUDICE is a 79 y.o. male with past medical history significant for afib, medtronic pacemaker, hypertension. Abdominal surgical history includes laparoscopic cholecystectomy.  Chart review shows patient was seen in the ED x10 days ago with urinary retention.  At that time his Foley catheter was removed and his pain resolved.  Per provider documentation it seems patient was describing his urinary retention symptoms as rectal pain.  CTA was performed and showed that he had bilateral pulmonary embolus without right heart strain.  Patient refused admission as well as heparin and treatment.  He left AMA  HPI Patient is presenting to emergency department today with chief complaint of rectal pain.  He states is been going on x1 week.  He is having a hard time using the bathroom.  He does not member when his last bowel movement was. Also reporting dysuria. He denies any other symptoms. Level 5 caveat applies for altered mental status.  Spoke with patient's son over the phone who was able to provide more history.  He states ever since patient left AMA he has significantly declined in health.  He is barely eating 1 meal a day however is drinking plenty of fluids.  He is having difficulty getting around and seems confused which is new.  Today patient told his son he was feeling short of breath.  Son has been trying to bring patient to the hospital multiple times since leaving AMA because he knew that he needed treatment and likely admission.  He denies patient having any recent falls.  Patient has not been taking blood thinners at home.     Past Medical History:  Diagnosis Date   Altered mental status 05/29/2015   Hypertension    Sinus node dysfunction [facility])     Patient Active Problem List   Diagnosis Date Noted    TIA (transient ischemic attack) 05/30/2015   Altered mental status 05/29/2015   Elevated blood pressure 06/03/2013   Pacemaker-medtronic 04/26/2011   Syncope 04/26/2011   Atrial fibrillation (HCC) 04/12/2010   Sinoatrial node dysfunction (HCC) 04/14/2009    Past Surgical History:  Procedure Laterality Date   INSERT / REPLACE / REMOVE PACEMAKER  ~ 2009   Medtronic Versa VEDro1   LAPAROSCOPIC CHOLECYSTECTOMY         Family History  Problem Relation Age of Onset   Hypertension Sister     Social History   Tobacco Use   Smoking status: Never Smoker   Smokeless tobacco: Former Neurosurgeon    Types: Chew   Tobacco comment: 05/30/2015 "chewed tobacco for 40 yrs; quit early 2000's"  Substance Use Topics   Alcohol use: No   Drug use: No    Home Medications Prior to Admission medications   Medication Sig Start Date End Date Taking? Authorizing Provider  aspirin 81 MG tablet Take 81 mg by mouth daily.     Yes [provider]  chlorthalidone (HYGROTON) 25 MG tablet Take 1 tablet (25 mg total) by mouth daily. 08/17/15  Yes Duke Salvia, MD  zolpidem (AMBIEN) 10 MG tablet Take 10 mg by mouth at bedtime.    Yes [provider]    Allergies    Patient has no known allergies.  Review of Systems   Review of Systems  Unable to perform ROS: Mental  status change     Physical Exam Updated Vital Signs BP 123/83 (BP Location: Right Arm)    Pulse 77    Temp 98.3 F (36.8 C) (Oral)    Resp 18    Ht 5\' 4"  (1.626 m)    Wt 81.6 kg    SpO2 96%    BMI 30.90 kg/m   Physical Exam Vitals and nursing note reviewed.  Constitutional:      General: He is not in acute distress.    Appearance: He is not ill-appearing.  HENT:     Head: Normocephalic and atraumatic.     Right Ear: Tympanic membrane and external ear normal.     Left Ear: Tympanic membrane and external ear normal.     Nose: Nose normal.     Mouth/Throat:     Mouth: Mucous membranes are dry.      Pharynx: Oropharynx is clear.  Eyes:     General: No scleral icterus.       Right eye: No discharge.        Left eye: No discharge.     Extraocular Movements: Extraocular movements intact.     Conjunctiva/sclera: Conjunctivae normal.     Pupils: Pupils are equal, round, and reactive to light.  Neck:     Vascular: No JVD.  Cardiovascular:     Rate and Rhythm: Normal rate and regular rhythm.     Pulses: Normal pulses.          Radial pulses are 2+ on the right side and 2+ on the left side.     Heart sounds: Normal heart sounds.  Pulmonary:     Comments: Lungs clear to auscultation in all fields. Symmetric chest rise. No wheezing, rales, or rhonchi. Abdominal:     Comments: Abdomen is soft, non-distended, and non-tender in all quadrants. No rigidity, no guarding. No peritoneal signs.  Genitourinary:    Comments: Chaperone Katelyn NT present for exam. Digital Rectal Exam reveals sphincter with good tone. No external hemorrhoids. No masses or fissures. Stool color is brown with no overt blood. No gross melena.  Musculoskeletal:        General: Normal range of motion.     Cervical back: Normal range of motion.  Skin:    General: Skin is warm and dry.     Capillary Refill: Capillary refill takes less than 2 seconds.  Neurological:     GCS: GCS eye subscore is 4. GCS verbal subscore is 5. GCS motor subscore is 6.     Comments: Fluent speech, no facial droop.  Patient is alert to self and location only. He is unable to tell the year or month.  CN 2-12 grossly intact.  Strong and equal grip strength in bilateral upper and lower extremities. Shuffling gait  Psychiatric:        Behavior: Behavior normal.     ED Results / Procedures / Treatments   Labs (all labs ordered are listed, but only abnormal results are displayed) Labs Reviewed  COMPREHENSIVE METABOLIC PANEL - Abnormal; Notable for the following components:      Result Value   Potassium 2.7 (*)    Chloride 93 (*)     Glucose, Bld 151 (*)    BUN 39 (*)    Creatinine, Ser 1.50 (*)    Total Bilirubin 3.2 (*)    GFR calc non Af Amer 44 (*)    GFR calc Af Amer 51 (*)    All other components within normal limits  CBC WITH DIFFERENTIAL/PLATELET - Abnormal; Notable for the following components:   WBC 13.9 (*)    Neutro Abs 12.2 (*)    Abs Immature Granulocytes 0.13 (*)    All other components within normal limits  URINALYSIS, ROUTINE W REFLEX MICROSCOPIC - Abnormal; Notable for the following components:   APPearance CLOUDY (*)    Hgb urine dipstick SMALL (*)    Protein, ur 30 (*)    Leukocytes,Ua LARGE (*)    WBC, UA >50 (*)    Bacteria, UA MANY (*)    All other components within normal limits  SARS CORONAVIRUS 2 BY RT PCR (HOSPITAL ORDER, PERFORMED IN [facility])  URINE CULTURE  PROTIME-INR  LIPASE, BLOOD  HEPARIN LEVEL (UNFRACTIONATED)  CBC  TROPONIN I (HIGH SENSITIVITY)  TROPONIN I (HIGH SENSITIVITY)    EKG None  Radiology CT Angio Chest PE W and/or Wo Contrast  Result Date: 03/06/2020 CLINICAL DATA:  High probability pulmonary embolism, prior pulmonary embolism, rectal pain EXAM: CT ANGIOGRAPHY CHEST CT ABDOMEN AND PELVIS WITH CONTRAST TECHNIQUE: Multidetector CT imaging of the chest was performed using the standard protocol during bolus administration of intravenous contrast. Multiplanar CT image reconstructions and MIPs were obtained to evaluate the vascular anatomy. Multidetector CT imaging of the abdomen and pelvis was performed using the standard protocol during bolus administration of intravenous contrast. CONTRAST:  OMNIPAQUE IOHEXOL 350 MG/ML SOLN COMPARISON:  02/25/2020 FINDINGS: CTA CHEST FINDINGS Cardiovascular: There are multiple acute pulmonary emboli again identified bilaterally. Since the prior examination, thrombus has decreased in volume, in keeping with dissolution under anticoagulation, though moderate embolic burden remains, particularly within the left  lower lobe. The central pulmonary arteries are of normal caliber. Mild right ventricular dilation is unchanged without CT evidence of right heart strain as the right left ventricles are of similar caliber and there is no shift of the intraventricular septum. Pacemaker leads are again seen within the right heart. Mild coronary artery calcification is again noted. No pericardial effusion. Thoracic aorta is ectatic in its descending segment, unchanged measuring 3.6 cm. Mediastinum/Nodes: No pathologic thoracic adenopathy. Esophagus unremarkable. Thyroid unremarkable peer Lungs/Pleura: Lungs are clear. No pleural effusion or pneumothorax. Musculoskeletal: No chest wall abnormality. No acute or significant osseous findings. Review of the MIP images confirms the above findings. CT ABDOMEN and PELVIS FINDINGS Hepatobiliary: Cholecystectomy has been performed. Mild intra and extrahepatic biliary ductal dilation is unchanged, likely representing post cholecystectomy change. Rim calcified 15 mm right subhepatic soft tissue nodules is unchanged, possibly representing postsurgical change related to cholecystectomy. The liver is otherwise unremarkable. Pancreas: Unremarkable Spleen: Unremarkable Adrenals/Urinary Tract: The adrenal glands are unremarkable. Multiple simple cortical cysts are seen bilaterally. A 5 mm nonobstructing calculus is again seen within the lower pole of the left kidney. The kidneys are otherwise unremarkable. The bladder is circumferentially thick walled and there is mild perivesicular inflammatory stranding, unchanged from prior examination. Small bladder diverticulum again identified. Together, the findings suggest changes of at least mild bladder outlet obstruction, though the bladder is not distended. Stomach/Bowel: Stomach, small bowel, are unremarkable. There is large stool again seen within the rectal vault which appears progressive since prior examination raising the question of fecal impaction.  There is severe sigmoid diverticulosis without superimposed inflammatory change. Appendix normal. No free intraperitoneal gas or fluid. Vascular/Lymphatic: Mild aortoiliac atherosclerotic calcification without evidence of aneurysm. No pathologic adenopathy within the abdomen and pelvis. Reproductive: The prostate gland is mildly enlarged. Other: None significant Musculoskeletal: Advanced degenerative changes are noted within  the lumbar spine. No acute bone abnormality. Review of the MIP images confirms the above findings. IMPRESSION: 1. Multiple acute pulmonary emboli again identified bilaterally. Since the prior examination, thrombus has decreased in volume, in keeping with dissolution under anticoagulation, though moderate embolic burden remains, particularly within the left lower lobe. No CT evidence of right heart strain. 2. Large stool within the rectal vault which appears progressive since prior examination, raising the question of fecal impaction. 3. Circumferentially thick walled bladder with mild perivesicular inflammatory stranding, unchanged from prior examination. Together, the findings suggest changes of at least mild bladder outlet obstruction, though the bladder is not distended. 4. Nonobstructing left renal calculus. 5. Severe sigmoid diverticulosis without superimposed inflammatory change. Aortic Atherosclerosis (ICD10-I70.0). Electronically Signed   By: Helyn Numbers MD   On: 03/06/2020 19:00   CT ABDOMEN PELVIS W CONTRAST  Result Date: 03/06/2020 CLINICAL DATA:  High probability pulmonary embolism, prior pulmonary embolism, rectal pain EXAM: CT ANGIOGRAPHY CHEST CT ABDOMEN AND PELVIS WITH CONTRAST TECHNIQUE: Multidetector CT imaging of the chest was performed using the standard protocol during bolus administration of intravenous contrast. Multiplanar CT image reconstructions and MIPs were obtained to evaluate the vascular anatomy. Multidetector CT imaging of the abdomen and pelvis was  performed using the standard protocol during bolus administration of intravenous contrast. CONTRAST:  OMNIPAQUE IOHEXOL 350 MG/ML SOLN COMPARISON:  02/25/2020 FINDINGS: CTA CHEST FINDINGS Cardiovascular: There are multiple acute pulmonary emboli again identified bilaterally. Since the prior examination, thrombus has decreased in volume, in keeping with dissolution under anticoagulation, though moderate embolic burden remains, particularly within the left lower lobe. The central pulmonary arteries are of normal caliber. Mild right ventricular dilation is unchanged without CT evidence of right heart strain as the right left ventricles are of similar caliber and there is no shift of the intraventricular septum. Pacemaker leads are again seen within the right heart. Mild coronary artery calcification is again noted. No pericardial effusion. Thoracic aorta is ectatic in its descending segment, unchanged measuring 3.6 cm. Mediastinum/Nodes: No pathologic thoracic adenopathy. Esophagus unremarkable. Thyroid unremarkable peer Lungs/Pleura: Lungs are clear. No pleural effusion or pneumothorax. Musculoskeletal: No chest wall abnormality. No acute or significant osseous findings. Review of the MIP images confirms the above findings. CT ABDOMEN and PELVIS FINDINGS Hepatobiliary: Cholecystectomy has been performed. Mild intra and extrahepatic biliary ductal dilation is unchanged, likely representing post cholecystectomy change. Rim calcified 15 mm right subhepatic soft tissue nodules is unchanged, possibly representing postsurgical change related to cholecystectomy. The liver is otherwise unremarkable. Pancreas: Unremarkable Spleen: Unremarkable Adrenals/Urinary Tract: The adrenal glands are unremarkable. Multiple simple cortical cysts are seen bilaterally. A 5 mm nonobstructing calculus is again seen within the lower pole of the left kidney. The kidneys are otherwise unremarkable. The bladder is circumferentially thick  walled and there is mild perivesicular inflammatory stranding, unchanged from prior examination. Small bladder diverticulum again identified. Together, the findings suggest changes of at least mild bladder outlet obstruction, though the bladder is not distended. Stomach/Bowel: Stomach, small bowel, are unremarkable. There is large stool again seen within the rectal vault which appears progressive since prior examination raising the question of fecal impaction. There is severe sigmoid diverticulosis without superimposed inflammatory change. Appendix normal. No free intraperitoneal gas or fluid. Vascular/Lymphatic: Mild aortoiliac atherosclerotic calcification without evidence of aneurysm. No pathologic adenopathy within the abdomen and pelvis. Reproductive: The prostate gland is mildly enlarged. Other: None significant Musculoskeletal: Advanced degenerative changes are noted within the lumbar spine. No acute bone abnormality. Review of  the MIP images confirms the above findings. IMPRESSION: 1. Multiple acute pulmonary emboli again identified bilaterally. Since the prior examination, thrombus has decreased in volume, in keeping with dissolution under anticoagulation, though moderate embolic burden remains, particularly within the left lower lobe. No CT evidence of right heart strain. 2. Large stool within the rectal vault which appears progressive since prior examination, raising the question of fecal impaction. 3. Circumferentially thick walled bladder with mild perivesicular inflammatory stranding, unchanged from prior examination. Together, the findings suggest changes of at least mild bladder outlet obstruction, though the bladder is not distended. 4. Nonobstructing left renal calculus. 5. Severe sigmoid diverticulosis without superimposed inflammatory change. Aortic Atherosclerosis (ICD10-I70.0). Electronically Signed   By: Helyn Numbers MD   On: 03/06/2020 19:00    Procedures .Critical Care Performed by:  Sherene Sires, PA-C Authorized by: Sherene Sires, PA-C   Critical care provider statement:    Critical care time (minutes):  40   Critical care was time spent personally by me on the following activities:  Development of treatment plan with patient or surrogate, evaluation of patient's response to treatment, examination of patient, obtaining history from patient or surrogate, ordering and performing treatments and interventions, ordering and review of laboratory studies, ordering and review of radiographic studies, pulse oximetry, re-evaluation of patient's condition and review of old charts   I assumed direction of critical care for this patient from another provider in my specialty: no     (including critical care time)  Medications Ordered in ED Medications  sodium chloride 0.9 % bolus 1,000 mL (has no administration in time range)  polyethylene glycol (MIRALAX / GLYCOLAX) packet 17 g (has no administration in time range)  heparin bolus via infusion 4,000 Units (has no administration in time range)  heparin ADULT infusion 100 units/mL (25000 units/242mL sodium chloride 0.45%) (has no administration in time range)  morphine 4 MG/ML injection 4 mg (4 mg Intravenous Given 03/06/20 1402)  ondansetron (ZOFRAN) injection 4 mg (4 mg Intravenous Given 03/06/20 1401)  potassium chloride 10 mEq in 100 mL IVPB (0 mEq Intravenous Stopped 03/06/20 1836)  sodium chloride 0.9 % bolus 1,000 mL (1,000 mLs Intravenous New Bag/Given 03/06/20 1602)  iohexol (OMNIPAQUE) 350 MG/ML injection 100 mL (100 mLs Intravenous Contrast Given 03/06/20 1742)  cefTRIAXone (ROCEPHIN) 1 g in sodium chloride 0.9 % 100 mL IVPB (1 g Intravenous New Bag/Given 03/06/20 1836)    ED Course  I have reviewed the triage vital signs and the nursing notes.  Pertinent labs & imaging results that were available during my care of the patient were reviewed by me and considered in my medical decision making (see chart for  details).    MDM Rules/Calculators/A&P                          History provided by patient with additional history obtained from chart review.    79 year old male presenting with chief complaint of rectal pain.  Extensive chart review shows that patient had recent ED visit x10 days ago and was found to have bilateral PEs without right heart strain.  He left AMA.  He has not been on anticoagulation. He is afebrile, normotensive.  No hypoxia or tachycardia.  On exam today patient is alert to him self and location only.  Does not know the year or month.  He is repeatedly saying that he wants to go home.  On exam he has normal work of breathing.  Has no abdominal tenderness.  Rectal exam performed with chaperone present and shows soft brown stool. No evidence of impaction. No gross melena.  Patient will likely need admission for his PE.  Labs today show leukocytosis of 13.9, hemoglobin normal.  INR normal.  Lipase within normal range.  Delta troponin flat. CMP shows hypokalemia with a potassium of 2.7, will replete with IV K., BUN/creatinine today is 39/1.50 compared to x 10 days ago it was 34/1.11.  No other significant electrolyte derangement, no transaminitis.  Total bilirubin is elevated at 3.2. UA is suggestive of UTI with large leukocytes, over 50 WBC.  Will send urine culture. Will given IV rocephin.  Covid test is negative EKG shows afib with controlled rate, known right bundle branch block.  CTA chest shows multiple acute bilateral Pes, appear smaller compared to previous CTA. No right heart strain. Heparin ordered per pharmacy consult.  CT A/P shows large stool within the rectal vault. Nothing was able to be removed on rectal exam, will order enema and Miralax.  Also radiologist commented on possible bladder outlet obstruction.  Findings and plan of care discussed with supervising physician Dr. Estell HarpinZammit who agrees with plan to admit. Spoke with Dr. Thomes DinningAdefeso with hospitalist service who agrees  to assume care of patient and bring into the hospital for further evaluation and management.     Portions of this note were generated with Scientist, clinical (histocompatibility and immunogenetics)Dragon dictation software. Dictation errors may occur despite best attempts at proofreading.   Final Clinical Impression(s) / ED Diagnoses Final diagnoses:  Acute cystitis without hematuria  PE (pulmonary thromboembolism) (HCC)  Constipation, unspecified constipation type    Rx / DC Orders ED Discharge Orders    None       Kathyrn Lasslbrizze, Nazirah Tri E, PA-C 03/06/20 1942    Bethann BerkshireZammit, Joseph, MD 03/08/20 1244

## 2020-03-07 ENCOUNTER — Observation Stay (HOSPITAL_COMMUNITY): Payer: Medicare HMO

## 2020-03-07 ENCOUNTER — Other Ambulatory Visit: Payer: Self-pay

## 2020-03-07 ENCOUNTER — Observation Stay (HOSPITAL_BASED_OUTPATIENT_CLINIC_OR_DEPARTMENT_OTHER): Payer: Medicare HMO

## 2020-03-07 DIAGNOSIS — B9620 Unspecified Escherichia coli [E. coli] as the cause of diseases classified elsewhere: Secondary | ICD-10-CM | POA: Diagnosis present

## 2020-03-07 DIAGNOSIS — Z20822 Contact with and (suspected) exposure to covid-19: Secondary | ICD-10-CM | POA: Diagnosis present

## 2020-03-07 DIAGNOSIS — I2699 Other pulmonary embolism without acute cor pulmonale: Principal | ICD-10-CM

## 2020-03-07 DIAGNOSIS — I82441 Acute embolism and thrombosis of right tibial vein: Secondary | ICD-10-CM | POA: Diagnosis present

## 2020-03-07 DIAGNOSIS — I361 Nonrheumatic tricuspid (valve) insufficiency: Secondary | ICD-10-CM

## 2020-03-07 DIAGNOSIS — K6289 Other specified diseases of anus and rectum: Secondary | ICD-10-CM | POA: Diagnosis present

## 2020-03-07 DIAGNOSIS — Z950 Presence of cardiac pacemaker: Secondary | ICD-10-CM | POA: Diagnosis not present

## 2020-03-07 DIAGNOSIS — Z87891 Personal history of nicotine dependence: Secondary | ICD-10-CM | POA: Diagnosis not present

## 2020-03-07 DIAGNOSIS — I129 Hypertensive chronic kidney disease with stage 1 through stage 4 chronic kidney disease, or unspecified chronic kidney disease: Secondary | ICD-10-CM | POA: Diagnosis present

## 2020-03-07 DIAGNOSIS — I4891 Unspecified atrial fibrillation: Secondary | ICD-10-CM | POA: Diagnosis present

## 2020-03-07 DIAGNOSIS — N179 Acute kidney failure, unspecified: Secondary | ICD-10-CM | POA: Diagnosis present

## 2020-03-07 DIAGNOSIS — I82411 Acute embolism and thrombosis of right femoral vein: Secondary | ICD-10-CM | POA: Diagnosis present

## 2020-03-07 DIAGNOSIS — N182 Chronic kidney disease, stage 2 (mild): Secondary | ICD-10-CM | POA: Diagnosis present

## 2020-03-07 DIAGNOSIS — E876 Hypokalemia: Secondary | ICD-10-CM | POA: Diagnosis present

## 2020-03-07 DIAGNOSIS — N390 Urinary tract infection, site not specified: Secondary | ICD-10-CM | POA: Diagnosis present

## 2020-03-07 DIAGNOSIS — K5900 Constipation, unspecified: Secondary | ICD-10-CM | POA: Diagnosis present

## 2020-03-07 DIAGNOSIS — E860 Dehydration: Secondary | ICD-10-CM | POA: Diagnosis present

## 2020-03-07 DIAGNOSIS — Z9049 Acquired absence of other specified parts of digestive tract: Secondary | ICD-10-CM | POA: Diagnosis not present

## 2020-03-07 LAB — COMPREHENSIVE METABOLIC PANEL
ALT: 35 U/L (ref 0–44)
AST: 35 U/L (ref 15–41)
Albumin: 3.1 g/dL — ABNORMAL LOW (ref 3.5–5.0)
Alkaline Phosphatase: 48 U/L (ref 38–126)
Anion gap: 9 (ref 5–15)
BUN: 33 mg/dL — ABNORMAL HIGH (ref 8–23)
CO2: 28 mmol/L (ref 22–32)
Calcium: 8.3 mg/dL — ABNORMAL LOW (ref 8.9–10.3)
Chloride: 99 mmol/L (ref 98–111)
Creatinine, Ser: 1.21 mg/dL (ref 0.61–1.24)
GFR calc Af Amer: >60 mL/min (ref >60)
GFR calc non Af Amer: 57 mL/min — ABNORMAL LOW (ref >60)
Glucose, Bld: 134 mg/dL — ABNORMAL HIGH (ref 70–99)
Potassium: 2.8 mmol/L — ABNORMAL LOW (ref 3.5–5.1)
Sodium: 136 mmol/L (ref 135–145)
Total Bilirubin: 1.9 mg/dL — ABNORMAL HIGH (ref 0.3–1.2)
Total Protein: 6 g/dL — ABNORMAL LOW (ref 6.5–8.1)

## 2020-03-07 LAB — CBC
HCT: 40.4 % (ref 39.0–52.0)
Hemoglobin: 13.1 g/dL (ref 13.0–17.0)
MCH: 29.6 pg (ref 26.0–34.0)
MCHC: 32.4 g/dL (ref 30.0–36.0)
MCV: 91.4 fL (ref 80.0–100.0)
Platelets: 199 10*3/uL (ref 150–400)
RBC: 4.42 MIL/uL (ref 4.22–5.81)
RDW: 13 % (ref 11.5–15.5)
WBC: 11.4 10*3/uL — ABNORMAL HIGH (ref 4.0–10.5)
nRBC: 0 % (ref 0.0–0.2)

## 2020-03-07 LAB — ECHOCARDIOGRAM COMPLETE
AR max vel: 2 cm2
AV Area VTI: 1.96 cm2
AV Area mean vel: 1.95 cm2
AV Mean grad: 3.4 mmHg
AV Peak grad: 6.2 mmHg
Ao pk vel: 1.24 m/s
Area-P 1/2: 4.06 cm2
Height: 64 in
P 1/2 time: 739 msec
S' Lateral: 2.81 cm
Weight: 2880 oz

## 2020-03-07 LAB — BILIRUBIN, DIRECT: Bilirubin, Direct: 0.7 mg/dL — ABNORMAL HIGH (ref 0.0–0.2)

## 2020-03-07 LAB — MAGNESIUM: Magnesium: 2 mg/dL (ref 1.7–2.4)

## 2020-03-07 LAB — HEPARIN LEVEL (UNFRACTIONATED)
Heparin Unfractionated: 0.4 IU/mL (ref 0.30–0.70)
Heparin Unfractionated: <0.1 IU/mL — ABNORMAL LOW (ref 0.30–0.70)
Heparin Unfractionated: 0.46 IU/mL (ref 0.30–0.70)

## 2020-03-07 LAB — APTT: aPTT: 143 seconds — ABNORMAL HIGH (ref 24–36)

## 2020-03-07 LAB — PROTIME-INR
INR: 1.3 — ABNORMAL HIGH (ref 0.8–1.2)
Prothrombin Time: 15.8 seconds — ABNORMAL HIGH (ref 11.4–15.2)

## 2020-03-07 LAB — PHOSPHORUS: Phosphorus: 3.1 mg/dL (ref 2.5–4.6)

## 2020-03-07 MED ORDER — SODIUM CHLORIDE 0.9 % IV SOLN: INTRAVENOUS | Status: DC

## 2020-03-07 MED ORDER — POTASSIUM CHLORIDE CRYS ER 20 MEQ PO TBCR
40.0000 meq | EXTENDED_RELEASE_TABLET | ORAL | Status: AC
  Administered 2020-03-07 (×2): 40 meq via ORAL
  Filled 2020-03-07 (×2): qty 2

## 2020-03-07 NOTE — Progress Notes (Signed)
*  PRELIMINARY RESULTS* Echocardiogram 2D Echocardiogram has been performed.  Stacey Drain 03/07/2020, 10:03 AM

## 2020-03-07 NOTE — Progress Notes (Signed)
PT Cancellation Note  Patient Details Name: Luke Crosby MRN: [id number] DOB: Nov 28, 1940   Cancelled Treatment:    Reason Eval/Treat Not Completed: Medical issues which prohibited therapy.  Patient positive for extensive DVT RLE.  Will need orders from MD to clear for ambulation when appropriate.   2:24 PM, 03/07/20 Ocie Bob, MPT Physical Therapist with [facility] [phone] office [phone] mobile phone

## 2020-03-07 NOTE — Progress Notes (Addendum)
ANTICOAGULATION CONSULT NOTE  Pharmacy Consult for heparin gtt  Indication: pulmonary embolus  No Known Allergies  Patient Measurements: Height: 5\' 4"  (162.6 cm) Weight: 81.6 kg (180 lb) IBW/kg (Calculated) : 59.2 Heparin Dosing Weight: HEPARIN DW (KG): 76.3   Vital Signs: BP: 96/64 (09/14 1030) Pulse Rate: 43 (09/14 1030)  Labs: Recent Labs    03/06/20 1325 03/07/20 0312 03/07/20 1005  HGB 15.1 13.1  --   HCT 46.1 40.4  --   PLT 235 199  --   APTT  --  143*  --   LABPROT 14.5 15.8*  --   INR 1.2 1.3*  --   HEPARINUNFRC  --  0.40 <0.10*  CREATININE 1.50* 1.21  --     Estimated Creatinine Clearance: 47.8 mL/min (by C-G formula based on SCr of 1.21 mg/dL).   Assessment: Luke Crosby a 79 y.o. male requires anticoagulation with a heparin iv infusion for the indication of  pulmonary embolus. Heparin gtt will be started following pharmacy protocol per pharmacy consult. Patient is not on previous oral anticoagulant that will require aPTT/HL correlation before transitioning to only HL monitoring.   Heparin level <0.10 but patient has pulled IV out as well as infusion being interrupted from procedure-  Since therapeutic on previous level will let the infusion run and repeat heparin level in afternoon. Heparin restarted at 1120 per RN  Goal of Therapy:  Heparin level 0.3-0.7 units/ml Monitor platelets by anticoagulation protocol: Yes   Plan:  Continue heparin at 1200 units/hr Will f/u 6-8 hr confirmatory heparin level  76, PharmD Clinical Pharmacist 03/07/2020 11:46 AM

## 2020-03-07 NOTE — Progress Notes (Signed)
PROGRESS NOTE  Luke Crosby  DOB: 21-Nov-1940  PCP: Gareth Morgan, MD ZHY:[id number]  DOA: 03/06/2020  LOS: 0 days   Chief Complaint  Patient presents with  . Rectal Pain   Brief narrative: Luke Crosby is a 79 y.o. male with PMH of atrial fibrillation on pacemaker (not on anticoagulation)and hypertension. Patient presented to the ED on 9/13 with complaint of several days of constipation and 1 week of rectal pain, dysuria. 9/3, patient was seen in the ED for complaint of urinary retention despite having a Foley catheter in place. Foley catheter was removed with subsequent pain relief. CT angiography of the chest done at that time showed bilateral pulmonary embolus without right heart strain. Patient refused admission and left AMA. At home, patient started having progressive generalized weakness, confusion and shortness of breath and hence brought back to the ED on 9/13 for further evaluation management.  In the ED, patient was hemodynamically stable. Labs showed leukocytosis, hypokalemia, creatinine 1.5.  Urinalysis was positive for large leukocytes and WBC >50.   CT angiography of chest with contrast showed multiple bilateral acute pulmonary emboli with no CT evidence of right heart strain. Large stool within the rectal vault which appears progressive since prior exam raising the question of fecal impaction was also noted.    Patient was admitted to hospital service for further management of multiple issues.  Subjective: Patient was seen and examined this morning in ED.  Pleasant elderly Caucasian male.  He was undergoing echocardiogram at that time.  Patient was able to respond to simple questions but was not able to elaborate on the history.  Seems to have underlying dementia.   Chart reviewed. No fever, hemodynamically stable. Labs this morning continues to show low potassium at 2.8, creatinine improving 1.21, WBC count improving 11.4.  Assessment/Plan: Acute bilateral  pulmonary embolism Right lower extremity DVT -CT angio chest showed multiple bilateral acute pulmonary embolism without evidence of right heart strain.  -Initially diagnosed on 9/3 however patient left AMA without anticoagulation. -Lower extremity ultrasound positive for extensive occlusive DVT extending from the right common femoral vein through the imaged right tibial veins. -currently on IV heparin.  UTI -Complains of painful urination.  -Urinalysis was positive for large leukocytes and WBC >50.   -Currently IV Rocephin. Pending urine culture report. -Continue to trend temperature and WBC. Recent Labs  Lab 03/06/20 1325 03/07/20 0312  WBC 13.9* 11.4*   Hypokalemia -Remains low at 2.8 this morning again. -Oral replacement ordered. Recheck tomorrow Recent Labs  Lab 03/06/20 1325 03/07/20 0312  K 2.7* 2.8*  MG  --  2.0  PHOS  --  3.1   AKI on CKD stage II -Creatinine was elevated to 1.5 on admission. Improved with hydration. Continue to monitor Recent Labs    02/13/20 0956 02/25/20 1708 03/06/20 1325 03/07/20 0312  BUN 22 34* 39* 33*  CREATININE 1.29* 1.11 1.50* 1.21   Atrial fibrillation -Has history of A. fib and seems to be taking aspirin 81 mg daily. -Currently rate controlled. Has a pacemaker in place. -On IV heparin drip  Essential hypertension -Chlorthalidone remains on hold.  -Continue to monitor blood pressure. IV hydralazine as needed.  -Echocardiogram with EF 60 to 65%  DVT prophylaxis: IV heparin drip  Mobility: PT eval Code Status:   Code Status: Full Code  Nutritional status: Body mass index is 30.9 kg/m.     Diet Order            Diet Heart Room service  appropriate? Yes; Fluid consistency: Thin  Diet effective now                 DVT prophylaxis: SCDs Start: 03/06/20 2208   Antimicrobials:  IV Rocephin Fluid: None Consultants: None Family Communication:  Discussed with patient's son on the phone  Status is:  Observation  The patient will require care spanning > 2 midnights and should be moved to inpatient because: Ongoing diagnostic testing needed not appropriate for outpatient work up and IV treatments appropriate due to intensity of illness or inability to take PO  Dispo: The patient is from: Home              Anticipated d/c is to: Home versus SNF.  Pending PT eval              Anticipated d/c date is: 2 days              Patient currently is not medically stable to d/c.       Infusions:  . sodium chloride    . cefTRIAXone (ROCEPHIN)  IV 1 g (03/07/20 1311)  . heparin 1,200 Units/hr (03/07/20 1120)    Scheduled Meds: . docusate  50 mg Oral Daily  . polyethylene glycol  17 g Oral Daily  . zolpidem  5 mg Oral QHS    Antimicrobials: Anti-infectives (From admission, onward)   Start     Dose/Rate Route Frequency Ordered Stop   03/07/20 1000  cefTRIAXone (ROCEPHIN) 1 g in sodium chloride 0.9 % 100 mL IVPB        1 g 200 mL/hr over 30 Minutes Intravenous Every 24 hours 03/06/20 2044 03/13/20 0959   03/06/20 1800  cefTRIAXone (ROCEPHIN) 1 g in sodium chloride 0.9 % 100 mL IVPB        1 g 200 mL/hr over 30 Minutes Intravenous  Once 03/06/20 1758 03/06/20 1906      PRN meds:    Objective: Vitals:   03/07/20 1200 03/07/20 1300  BP: 92/70 (!) 88/67  Pulse: (!) 106 77  Resp: (!) 24 (!) 21  Temp:    SpO2: (!) 76% 96%    Intake/Output Summary (Last 24 hours) at 03/07/2020 1422 Last data filed at 03/06/2020 2245 Gross per 24 hour  Intake 2100 ml  Output --  Net 2100 ml   Filed Weights   03/06/20 0947  Weight: 81.6 kg   Weight change:  Body mass index is 30.9 kg/m.   Physical Exam: General exam: Appears calm and comfortable.  Not in physical distress Skin: No rashes, lesions or ulcers. HEENT: Atraumatic, normocephalic, supple neck, no obvious bleeding Lungs: Clear to auscultation bilaterally CVS: Regular rate and rhythm, no murmur GI/Abd soft, nontender,  nondistended, bowel sound present CNS: Alert, awake, able to answer simple questions but not able to give details of history.  May have underlying dementia Psychiatry: Mood appropriate Extremities: No pedal edema, no calf tenderness  Data Review: I have personally reviewed the laboratory data and studies available.  Recent Labs  Lab 03/06/20 1325 03/07/20 0312  WBC 13.9* 11.4*  NEUTROABS 12.2*  --   HGB 15.1 13.1  HCT 46.1 40.4  MCV 91.5 91.4  PLT 235 199   Recent Labs  Lab 03/06/20 1325 03/07/20 0312  NA 137 136  K 2.7* 2.8*  CL 93* 99  CO2 30 28  GLUCOSE 151* 134*  BUN 39* 33*  CREATININE 1.50* 1.21  CALCIUM 9.7 8.3*  MG  --  2.0  PHOS  --  3.1    F/u labs ordered  Signed, Lorin Glass, MD [facility] 03/07/2020

## 2020-03-07 NOTE — Progress Notes (Signed)
Heparin level ordered for 1800, notified lab of need to have that draw asap.

## 2020-03-07 NOTE — Progress Notes (Signed)
Patient and son, Luke Crosby at bedside this evening mentioned there had been some discussion of possible rehab placement at discharge. Son states they have family who are nurses who will assist with his care at home. They do not want placement at discharge. Nursing passed info along to Dr. Pola Corn.

## 2020-03-07 NOTE — Progress Notes (Signed)
ANTICOAGULATION CONSULT NOTE  Pharmacy Consult for heparin gtt  Indication: pulmonary embolus  No Known Allergies  Patient Measurements: Height: 5\' 4"  (162.6 cm) Weight: 81.6 kg (180 lb) IBW/kg (Calculated) : 59.2 Heparin Dosing Weight: HEPARIN DW (KG): 76.3   Vital Signs: BP: 95/72 (09/13 1800) Pulse Rate: 84 (09/13 1730)  Labs: Recent Labs    03/06/20 1325 03/07/20 0312  HGB 15.1 13.1  HCT 46.1 40.4  PLT 235 199  LABPROT 14.5  --   INR 1.2  --   HEPARINUNFRC  --  0.40  CREATININE 1.50*  --     Estimated Creatinine Clearance: 38.5 mL/min (A) (by C-G formula based on SCr of 1.5 mg/dL (H)).   Assessment: Luke Crosby a 79 y.o. male requires anticoagulation with a heparin iv infusion for the indication of  pulmonary embolus. Heparin gtt will be started following pharmacy protocol per pharmacy consult. Patient is not on previous oral anticoagulant that will require aPTT/HL correlation before transitioning to only HL monitoring.   Heparin level therapeutic (0.4) on gtt at 1200 units/hr. Noted pt pulled out IV when getting out of bed and some bleeding from pulled IV site but resolved with pressure.  Goal of Therapy:  Heparin level 0.3-0.7 units/ml Monitor platelets by anticoagulation protocol: Yes   Plan:  Continue heparin at 1200 units/hr Will f/u 6 hr confirmatory heparin level  76, PharmD, BCPS Please see amion for complete clinical pharmacist phone list 03/07/2020,3:35 AM

## 2020-03-07 NOTE — Progress Notes (Signed)
ANTICOAGULATION CONSULT NOTE - Follow-Up  Pharmacy Consult for heparin gtt  Indication: pulmonary embolus  No Known Allergies  Patient Measurements: Height: 5\' 4"  (162.6 cm) Weight: 74.4 kg (164 lb 0.4 oz) IBW/kg (Calculated) : 59.2 Heparin Dosing Weight: HEPARIN DW (KG): 74.1   Vital Signs: Temp: 97.4 F (36.3 C) (09/14 1516) Temp Source: Oral (09/14 1516) BP: 111/77 (09/14 1516) Pulse Rate: 76 (09/14 1516)  Labs: Recent Labs    03/06/20 1325 03/07/20 0312 03/07/20 1005 03/07/20 1829  HGB 15.1 13.1  --   --   HCT 46.1 40.4  --   --   PLT 235 199  --   --   APTT  --  143*  --   --   LABPROT 14.5 15.8*  --   --   INR 1.2 1.3*  --   --   HEPARINUNFRC  --  0.40 <0.10* 0.46  CREATININE 1.50* 1.21  --   --     Estimated Creatinine Clearance: 45.7 mL/min (by C-G formula based on SCr of 1.21 mg/dL).   Assessment:  79 yo M on heparin for PE.  Heparin level now therapeutic on 1200 units/hr (following IV restart and complications earlier today).    Goal of Therapy:  Heparin level 0.3-0.7 units/ml Monitor platelets by anticoagulation protocol: Yes   Plan:  Continue heparin at 1200 units/hr Daily heparin level and CBC  Koah Chisenhall, Pharm.D., BCPS Clinical Pharmacist  **Pharmacist phone directory can be found on [email or web address] listed under [facility].  03/07/2020 8:05 PM

## 2020-03-07 NOTE — ED Notes (Signed)
Pt in room with blood on the floor, attempting to clean up blood, urine, and stool, pt removed IV when getting out of bed, pt Heparin infusion paused, guaze placed over IV insertion site where the pt removed the catheter, bleeding controlled, the pt was placed back in bed, after new gown, and full set of linen change occurred, bedside cleaning complete, pt encouraged to remain in bed, pt continues to be noncompliant with telemetry monitoring, R AC flushed with NS and heparin infusion restarted at this site after changing the tubing, rate verified, Primary RN notified, will continue to monitor

## 2020-03-08 LAB — BASIC METABOLIC PANEL
Anion gap: 8 (ref 5–15)
BUN: 25 mg/dL — ABNORMAL HIGH (ref 8–23)
CO2: 27 mmol/L (ref 22–32)
Calcium: 8.4 mg/dL — ABNORMAL LOW (ref 8.9–10.3)
Chloride: 103 mmol/L (ref 98–111)
Creatinine, Ser: 1.06 mg/dL (ref 0.61–1.24)
GFR calc Af Amer: >60 mL/min (ref >60)
GFR calc non Af Amer: >60 mL/min (ref >60)
Glucose, Bld: 127 mg/dL — ABNORMAL HIGH (ref 70–99)
Potassium: 3.2 mmol/L — ABNORMAL LOW (ref 3.5–5.1)
Sodium: 138 mmol/L (ref 135–145)

## 2020-03-08 LAB — CBC
HCT: 38.5 % — ABNORMAL LOW (ref 39.0–52.0)
Hemoglobin: 12.7 g/dL — ABNORMAL LOW (ref 13.0–17.0)
MCH: 30.3 pg (ref 26.0–34.0)
MCHC: 33 g/dL (ref 30.0–36.0)
MCV: 91.9 fL (ref 80.0–100.0)
Platelets: 197 10*3/uL (ref 150–400)
RBC: 4.19 MIL/uL — ABNORMAL LOW (ref 4.22–5.81)
RDW: 13.1 % (ref 11.5–15.5)
WBC: 8.1 10*3/uL (ref 4.0–10.5)
nRBC: 0 % (ref 0.0–0.2)

## 2020-03-08 LAB — HEPARIN LEVEL (UNFRACTIONATED): Heparin Unfractionated: 0.61 IU/mL (ref 0.30–0.70)

## 2020-03-08 MED ORDER — POTASSIUM CHLORIDE CRYS ER 20 MEQ PO TBCR
40.0000 meq | EXTENDED_RELEASE_TABLET | Freq: Once | ORAL | Status: AC
  Administered 2020-03-08: 40 meq via ORAL
  Filled 2020-03-08: qty 2

## 2020-03-08 NOTE — Progress Notes (Signed)
ANTICOAGULATION CONSULT NOTE  Pharmacy Consult for heparin gtt  Indication: pulmonary embolus  No Known Allergies  Patient Measurements: Height: 5\' 4"  (162.6 cm) Weight: 74.4 kg (164 lb 0.4 oz) IBW/kg (Calculated) : 59.2 Heparin Dosing Weight: HEPARIN DW (KG): 74.1   Vital Signs: Temp: 97.8 F (36.6 C) (09/15 0259) BP: 103/76 (09/15 0259) Pulse Rate: 74 (09/15 0259)  Labs: Recent Labs    03/06/20 1325 03/06/20 1325 03/07/20 0312 03/07/20 0312 03/07/20 1005 03/07/20 1829 03/08/20 0558  HGB 15.1   < > 13.1  --   --   --  12.7*  HCT 46.1  --  40.4  --   --   --  38.5*  PLT 235  --  199  --   --   --  197  APTT  --   --  143*  --   --   --   --   LABPROT 14.5  --  15.8*  --   --   --   --   INR 1.2  --  1.3*  --   --   --   --   HEPARINUNFRC  --   --  0.40   < > <0.10* 0.46 0.61  CREATININE 1.50*  --  1.21  --   --   --  1.06   < > = values in this interval not displayed.    Estimated Creatinine Clearance: 52.2 mL/min (by C-G formula based on SCr of 1.06 mg/dL).   Assessment: Luke Crosby a 79 y.o. male requires anticoagulation with a heparin iv infusion for the indication of  pulmonary embolus.  Patient is not on previous oral anticoagulant that will require aPTT/HL correlation before transitioning to only HL monitoring.   HL 0.61- therapeutic   Goal of Therapy:  Heparin level 0.3-0.7 units/ml Monitor platelets by anticoagulation protocol: Yes   Plan:  Continue heparin at 1200 units/hr Heparin level and H&H daily Monitor for s/s of bleeding  76, PharmD Clinical Pharmacist 03/08/2020 9:00 AM

## 2020-03-08 NOTE — Evaluation (Signed)
Physical Therapy Evaluation Patient Details Name: Luke Crosby MRN: [id number] DOB: 27-Jul-1940 Today's Date: 03/08/2020   History of Present Illness  Luke Crosby is a 79 y.o. male with medical history significant for atrial fibrillation on pacemaker (not on anticoagulation) and hypertension who presents to the emergency department due to 1 week of rectal pain, patient complained of constipation with unknown time of last bowel movement.  He denies abdominal pain,, abdominal distention, though he endorsed decreased appetite within same time period.  Patient also complained of painful urination, he was seen in the ED about 10 days ago with complaint of urinary retention, at that time, he had a Foley catheter that was removed with subsequent pain relief, CT angiography of the chest done at that time showed bilateral pulmonary embolus without right heart strain, patient refused being admitted for treatment at that time and he left AMA. ED PA states that she called son over the phone for further information regarding the patient and it was reported that he seemed to be having more difficulty getting around and sometimes appears confused and patient was said to feel short of breath at home today, so, son insisted that he should go to ED for further evaluation and management.  Per ED medical record, son denies any history of fall and patient was not on any blood thinners at home.  At bedside, patient denies any confusion and shortness of breath.    Clinical Impression  Patient functioning at baseline for functional mobility and gait, demonstrates good return for transferring to commode in bathroom and ambulating in room/hallway without loss of balance.  Plan:  Patient discharged from physical therapy to care of nursing for ambulation daily as tolerated for length of stay.     Follow Up Recommendations No PT follow up    Equipment Recommendations  None recommended by PT    Recommendations for  Other Services       Precautions / Restrictions Precautions Precautions: None Restrictions Weight Bearing Restrictions: No      Mobility  Bed Mobility Overal bed mobility: Modified Independent                Transfers Overall transfer level: Modified independent                  Ambulation/Gait Ambulation/Gait assistance: Modified independent (Device/Increase time) Gait Distance (Feet): 120 Feet Assistive device: None Gait Pattern/deviations: WFL(Within Functional Limits) Gait velocity: decreased   General Gait Details: grossly WFL demonstrating slightly labored cadence without loss of balance or having to lean on nearby objects  Stairs            Wheelchair Mobility    Modified Rankin (Stroke Patients Only)       Balance Overall balance assessment: No apparent balance deficits (not formally assessed)                                           Pertinent Vitals/Pain Pain Assessment: No/denies pain    Home Living Family/patient expects to be discharged to:: Private residence Living Arrangements: Children Available Help at Discharge: Family;Available PRN/intermittently Type of Home: House Home Access: Ramped entrance     Home Layout: One level Home Equipment: Walker - 2 wheels;Cane - single point;Bedside commode      Prior Function                 Hand  Dominance        Extremity/Trunk Assessment   Upper Extremity Assessment Upper Extremity Assessment: Overall WFL for tasks assessed    Lower Extremity Assessment Lower Extremity Assessment: Overall WFL for tasks assessed    Cervical / Trunk Assessment Cervical / Trunk Assessment: Kyphotic  Communication      Cognition Arousal/Alertness: Awake/alert Behavior During Therapy: WFL for tasks assessed/performed Overall Cognitive Status: Within Functional Limits for tasks assessed                                        General Comments       Exercises     Assessment/Plan    PT Assessment Patent does not need any further PT services  PT Problem List         PT Treatment Interventions      PT Goals (Current goals can be found in the Care Plan section)  Acute Rehab PT Goals Patient Stated Goal: return  home with family to assist PT Goal Formulation: With patient Time For Goal Achievement: 03/08/20 Potential to Achieve Goals: Good    Frequency     Barriers to discharge        Co-evaluation               AM-PAC PT "6 Clicks" Mobility  Outcome Measure Help needed turning from your back to your side while in a flat bed without using bedrails?: None Help needed moving from lying on your back to sitting on the side of a flat bed without using bedrails?: None Help needed moving to and from a bed to a chair (including a wheelchair)?: None Help needed standing up from a chair using your arms (e.g., wheelchair or bedside chair)?: None Help needed to walk in hospital room?: None Help needed climbing 3-5 steps with a railing? : A Little 6 Click Score: 23    End of Session   Activity Tolerance: Patient tolerated treatment well Patient left: in bed;with call bell/phone within reach (seated at EOB to eat breakfast) Nurse Communication: Mobility status PT Visit Diagnosis: Unsteadiness on feet (R26.81);Other abnormalities of gait and mobility (R26.89);Muscle weakness (generalized) (M62.81)    Time: 3154-0086 PT Time Calculation (min) (ACUTE ONLY): 20 min   Charges:   PT Evaluation $PT Eval Moderate Complexity: 1 Mod PT Treatments $Therapeutic Activity: 8-22 mins        10:56 AM, 03/08/20 Ocie Bob, MPT Physical Therapist with [facility] 336 [phone] office [phone] mobile phone

## 2020-03-08 NOTE — Progress Notes (Addendum)
PROGRESS NOTE  Luke Crosby  DOB: 05/03/1941  PCP: Gareth Morgan, MD ENI:[id number]  DOA: 03/06/2020  LOS: 1 day   Chief Complaint  Patient presents with   Rectal Pain   Brief narrative: Luke Crosby is a 79 y.o. male with PMH of atrial fibrillation on pacemaker (not on anticoagulation)and hypertension. Patient presented to the ED on 9/13 with complaint of several days of constipation and 1 week of rectal pain, dysuria. 9/3, patient was seen in the ED for complaint of urinary retention despite having a Foley catheter in place. Foley catheter was removed with subsequent pain relief. CT angiography of the chest done at that time showed bilateral pulmonary embolus without right heart strain. Patient refused admission and left AMA. At home, patient started having progressive generalized weakness, confusion and shortness of breath and hence brought back to the ED on 9/13 for further evaluation management.  In the ED, patient was hemodynamically stable. Labs showed leukocytosis, hypokalemia, creatinine 1.5.  Urinalysis was positive for large leukocytes and WBC >50.   CT angiography of chest with contrast showed multiple bilateral acute pulmonary emboli with no CT evidence of right heart strain. Large stool within the rectal vault which appears progressive since prior exam raising the question of fecal impaction was also noted.    Patient was admitted to hospital service for further management of multiple issues.  Subjective: Patient was seen and examined this morning. Sitting up at the edge of the bed.  Taking breakfast.  Feels much better.  Looks better.  No fever.  Hemodynamically stable. Labs from this morning with potassium low at 3.2, creatinine improving.  Assessment/Plan: Acute bilateral pulmonary embolism Right lower extremity DVT -CT angio chest showed multiple bilateral acute pulmonary embolism without evidence of right heart strain.  -Initially diagnosed on 9/3  however patient left AMA without anticoagulation. -Lower extremity ultrasound positive for extensive occlusive DVT extending from the right common femoral vein through the imaged right tibial veins. -No history of DVT/PE in the past -currently on IV heparin.  Continue IV heparin for now because of the extent of clots.   -Plan to switch to Eliquis on discharge. -Outpatient follow-up with hematology for hypercoagulable work-up.  UTI -Complained of dysuria on admission. -Urinalysis was positive for large leukocytes and WBC >50.   -Currently IV Rocephin.  -Urine culture is showing more than 100,000 CFU per mL of gram-negative rods.  Pending final report and sensitivity. -No fever in last 24 hours.  WBC count back to normal. -Continue to trend temperature and WBC. Recent Labs  Lab 03/06/20 1325 03/07/20 0312 03/08/20 0558  WBC 13.9* 11.4* 8.1   Hypokalemia -Improved but still remains low this morning. -Oral replacement ordered again for this morning. Recheck tomorrow Recent Labs  Lab 03/06/20 1325 03/07/20 0312 03/08/20 0558  K 2.7* 2.8* 3.2*  MG  --  2.0  --   PHOS  --  3.1  --    AKI on CKD stage II -Creatinine was elevated to 1.5 on admission. Improved with hydration. Continue to monitor Recent Labs    02/13/20 0956 02/25/20 1708 03/06/20 1325 03/07/20 0312 03/08/20 0558  BUN 22 34* 39* 33* 25*  CREATININE 1.29* 1.11 1.50* 1.21 1.06   SA node dysfunction s/p ppm. -couldn't find a documented hx of A fib in chart review.  History of essential hypertension -Blood pressure currently running on the low normal range.  Chlorthalidone remains on hold.  -Continue to monitor blood pressure. IV hydralazine as needed.  -  Echocardiogram with EF 60 to 65%  Mobility: PT eval obtained.  No needs. Code Status:   Code Status: Full Code  Nutritional status: Body mass index is 28.15 kg/m.     Diet Order            Diet Heart Room service appropriate? Yes; Fluid consistency:  Thin  Diet effective now                 DVT prophylaxis: SCDs Start: 03/06/20 2208   Antimicrobials:  IV Rocephin Fluid: Continue normal saline at 75 mL/h Consultants: None Family Communication:  Discussed with patient's son on the phone on 9/14  Status is: Observation  The patient will require care spanning > 2 midnights and should be moved to inpatient because: Ongoing diagnostic testing needed not appropriate for outpatient work up and IV treatments appropriate due to intensity of illness or inability to take PO  Dispo: The patient is from: Home              Anticipated d/c is to: Home               Anticipated d/c date is: Hopefully tomorrow              Patient currently is not medically stable to d/c.  Infusions:   sodium chloride 75 mL/hr at 03/07/20 2350   cefTRIAXone (ROCEPHIN)  IV 1 g (03/08/20 0936)   heparin 1,200 Units/hr (03/07/20 1456)    Scheduled Meds:  docusate  50 mg Oral Daily   polyethylene glycol  17 g Oral Daily   zolpidem  5 mg Oral QHS    Antimicrobials: Anti-infectives (From admission, onward)   Start     Dose/Rate Route Frequency Ordered Stop   03/07/20 1000  cefTRIAXone (ROCEPHIN) 1 g in sodium chloride 0.9 % 100 mL IVPB        1 g 200 mL/hr over 30 Minutes Intravenous Every 24 hours 03/06/20 2044 03/13/20 0959   03/06/20 1800  cefTRIAXone (ROCEPHIN) 1 g in sodium chloride 0.9 % 100 mL IVPB        1 g 200 mL/hr over 30 Minutes Intravenous  Once 03/06/20 1758 03/06/20 1906      PRN meds:    Objective: Vitals:   03/08/20 0259 03/08/20 0739  BP: 103/76 116/80  Pulse: 74 97  Resp: 20 20  Temp: 97.8 F (36.6 C) 98 F (36.7 C)  SpO2: 97% 98%    Intake/Output Summary (Last 24 hours) at 03/08/2020 1037 Last data filed at 03/08/2020 0537 Gross per 24 hour  Intake 2018.58 ml  Output --  Net 2018.58 ml   Filed Weights   03/06/20 0947 03/07/20 1516  Weight: 81.6 kg 74.4 kg   Weight change: -7.247 kg Body mass index is  28.15 kg/m.   Physical Exam: General exam: Appears calm and comfortable. Not in physical distress. Skin: No rashes, lesions or ulcers. HEENT: Atraumatic, normocephalic, supple neck, no obvious bleeding Lungs: Clear to auscultation bilaterally CVS: Regular rate and rhythm, no murmur GI/Abd soft, nontender, nondistended, bowel sound present CNS: Alert, awake, oriented to place and person.  Able to answer simple questions  Psychiatry: Mood appropriate Extremities: No pedal edema, no calf tenderness  Data Review: I have personally reviewed the laboratory data and studies available.  Recent Labs  Lab 03/06/20 1325 03/07/20 0312 03/08/20 0558  WBC 13.9* 11.4* 8.1  NEUTROABS 12.2*  --   --   HGB 15.1 13.1 12.7*  HCT 46.1 40.4  38.5*  MCV 91.5 91.4 91.9  PLT 235 199 197   Recent Labs  Lab 03/06/20 1325 03/07/20 0312 03/08/20 0558  NA 137 136 138  K 2.7* 2.8* 3.2*  CL 93* 99 103  CO2 30 28 27   GLUCOSE 151* 134* 127*  BUN 39* 33* 25*  CREATININE 1.50* 1.21 1.06  CALCIUM 9.7 8.3* 8.4*  MG  --  2.0  --   PHOS  --  3.1  --     F/u labs ordered  Signed, , MD [facility] 03/08/2020

## 2020-03-08 NOTE — Progress Notes (Signed)
   03/07/20 2339  Assess: MEWS Score  Temp 98.5 F (36.9 C)  BP 92/77  Pulse Rate (!) 102  Resp 18  SpO2 96 %  O2 Device Room Air  Assess: MEWS Score  MEWS Temp 0  MEWS Systolic 1  MEWS Pulse 1  MEWS RR 0  MEWS LOC 0  MEWS Score 2  MEWS Score Color Yellow  Assess: if the MEWS score is Yellow or Red  Were vital signs taken at a resting state? Yes  Focused Assessment No change from prior assessment  Early Detection of Sepsis Score *See Row Information* Medium  MEWS guidelines implemented *See Row Information* Yes  Notify: Charge Nurse/RN  Name of Charge Nurse/RN Notified  (I am charge nurse, discussed with fellow nurses)  Date Charge Nurse/RN Notified 03/07/20  Time Charge Nurse/RN Notified 2339

## 2020-03-09 LAB — BASIC METABOLIC PANEL WITH GFR
Anion gap: 6 (ref 5–15)
BUN: 20 mg/dL (ref 8–23)
CO2: 27 mmol/L (ref 22–32)
Calcium: 8.2 mg/dL — ABNORMAL LOW (ref 8.9–10.3)
Chloride: 105 mmol/L (ref 98–111)
Creatinine, Ser: 1.25 mg/dL — ABNORMAL HIGH (ref 0.61–1.24)
GFR calc Af Amer: >60 mL/min (ref >60)
GFR calc non Af Amer: 54 mL/min — ABNORMAL LOW (ref >60)
Glucose, Bld: 115 mg/dL — ABNORMAL HIGH (ref 70–99)
Potassium: 3.6 mmol/L (ref 3.5–5.1)
Sodium: 138 mmol/L (ref 135–145)

## 2020-03-09 LAB — CBC
HCT: 36.9 % — ABNORMAL LOW (ref 39.0–52.0)
Hemoglobin: 11.7 g/dL — ABNORMAL LOW (ref 13.0–17.0)
MCH: 29.8 pg (ref 26.0–34.0)
MCHC: 31.7 g/dL (ref 30.0–36.0)
MCV: 93.9 fL (ref 80.0–100.0)
Platelets: 189 K/uL (ref 150–400)
RBC: 3.93 MIL/uL — ABNORMAL LOW (ref 4.22–5.81)
RDW: 13.2 % (ref 11.5–15.5)
WBC: 7.1 K/uL (ref 4.0–10.5)
nRBC: 0 % (ref 0.0–0.2)

## 2020-03-09 LAB — HEPARIN LEVEL (UNFRACTIONATED): Heparin Unfractionated: 0.26 IU/mL — ABNORMAL LOW (ref 0.30–0.70)

## 2020-03-09 MED ORDER — APIXABAN 5 MG PO TABS: 5.0000 mg | ORAL_TABLET | Freq: Two times a day (BID) | ORAL | Status: DC

## 2020-03-09 MED ORDER — HEPARIN BOLUS VIA INFUSION
2000.0000 [IU] | Freq: Once | INTRAVENOUS | Status: AC
  Administered 2020-03-09: 2000 [IU] via INTRAVENOUS
  Filled 2020-03-09: qty 2000

## 2020-03-09 MED ORDER — CEPHALEXIN 500 MG PO CAPS: 500.0000 mg | ORAL_CAPSULE | Freq: Three times a day (TID) | ORAL | Status: DC

## 2020-03-09 MED ORDER — APIXABAN 5 MG PO TABS
ORAL_TABLET | ORAL | 0 refills | Status: DC
Start: 2020-03-09 — End: 2020-03-24

## 2020-03-09 MED ORDER — APIXABAN 5 MG PO TABS
10.0000 mg | ORAL_TABLET | Freq: Two times a day (BID) | ORAL | Status: DC
  Administered 2020-03-09: 10 mg via ORAL
  Filled 2020-03-09: qty 2

## 2020-03-09 MED ORDER — CEPHALEXIN 500 MG PO CAPS: 500.0000 mg | ORAL_CAPSULE | Freq: Three times a day (TID) | ORAL | 0 refills | Status: AC

## 2020-03-09 NOTE — Progress Notes (Signed)
ANTICOAGULATION CONSULT NOTE - Follow Up Consult  Pharmacy Consult for heparin Indication: pulmonary embolus  Labs: Recent Labs    03/06/20 1325 03/06/20 1325 03/06/20 1523 03/07/20 0312 03/07/20 0312 03/07/20 1005 03/07/20 1829 03/08/20 0558 03/09/20 0243  HGB 15.1   < >  --  13.1   < >  --   --  12.7* 11.7*  HCT 46.1   < >  --  40.4  --   --   --  38.5* 36.9*  PLT 235   < >  --  199  --   --   --  197 189  APTT  --   --   --  143*  --   --   --   --   --   LABPROT 14.5  --   --  15.8*  --   --   --   --   --   INR 1.2  --   --  1.3*  --   --   --   --   --   HEPARINUNFRC  --   --   --  0.40  --    < > 0.46 0.61 0.26*  CREATININE 1.50*  --   --  1.21  --   --   --  1.06  --   TROPONINIHS 11  --  12  --   --   --   --   --   --    < > = values in this interval not displayed.    Assessment: 79yo male subtherapeutic on heparin after two levels at goal; no gtt issues or signs of bleeding per RN.  Goal of Therapy:  Heparin level 0.3-0.7 units/ml   Plan:  Will give small heparin bolus of 2000 units and increase heparin gtt by 2 units/kg/hr to 1350 units/hr and check level in 8 hours.    Vernard Gambles, PharmD, BCPS  03/09/2020,4:54 AM

## 2020-03-09 NOTE — Progress Notes (Signed)
ANTICOAGULATION CONSULT NOTE  Pharmacy Consult for heparin gtt ---> eliquis Indication: pulmonary embolus  No Known Allergies  Patient Measurements: Height: 5\' 4"  (162.6 cm) Weight: 74.4 kg (164 lb 0.4 oz) IBW/kg (Calculated) : 59.2  HEPARIN DW (KG): 74.1  Vital Signs: Temp: 98.5 F (36.9 C) (09/16 0610) BP: 89/60 (09/16 0610) Pulse Rate: 82 (09/16 0610)  Labs: Recent Labs    03/06/20 1325 03/06/20 1325 03/07/20 0312 03/07/20 0312 03/07/20 1005 03/07/20 1829 03/08/20 0558 03/09/20 0243  HGB 15.1   < > 13.1   < >  --   --  12.7* 11.7*  HCT 46.1   < > 40.4  --   --   --  38.5* 36.9*  PLT 235   < > 199  --   --   --  197 189  APTT  --   --  143*  --   --   --   --   --   LABPROT 14.5  --  15.8*  --   --   --   --   --   INR 1.2  --  1.3*  --   --   --   --   --   HEPARINUNFRC  --   --  0.40  --    < > 0.46 0.61 0.26*  CREATININE 1.50*   < > 1.21  --   --   --  1.06 1.25*   < > = values in this interval not displayed.    Estimated Creatinine Clearance: 44.3 mL/min (A) (by C-G formula based on SCr of 1.25 mg/dL (H)).   Assessment: Luke Crosby a 79 y.o. male requires anticoagulation with a heparin iv infusion for the indication of  pulmonary embolus.  Patient is not on previous oral anticoagulant that will require aPTT/HL correlation before transitioning to only HL monitoring.  Plan to transition to eliquis now   Goal of Therapy:  Monitor platelets by anticoagulation protocol: Yes   Plan:  D/C heparin Eliquis 10mg  po bid x 7 days, then 5mg  po bid Educate on eliquis Monitor for s/s of bleeding  76, BS , Clinical Pharmacist Pager [phone] 03/09/2020 10:18 AM

## 2020-03-09 NOTE — Discharge Summary (Signed)
Physician Discharge Summary  Luke Hockeyhomas M Swamy WUJ:[id number] DOB: [birth year] DOA: 03/06/2020  PCP: Gareth MorganKnowlton, [name], MD  Admit date: 03/06/2020 Discharge date: 03/09/2020  Admitted From: Home Discharge disposition: Home   Code Status: Full Code  Diet Recommendation: Cardiac diet  Discharge Diagnosis:   Principal Problem:   PE (pulmonary thromboembolism) (HCC) Active Problems:   Atrial fibrillation (HCC)   Constipation   Essential hypertension   Hypokalemia   Elevated serum creatinine   Elevated BUN   Serum total bilirubin elevated   Leukocytosis   UTI (urinary tract infection)   Dehydration   Pulmonary embolism (HCC)  History of Present Illness / Brief narrative:  Luke Crosby is a 79 y.o. male with PMH ofatrial fibrillation on pacemaker (not on anticoagulation)and hypertension. Patient presented to the ED on 9/13 with complaint of several days of constipation and 1 week of rectal pain, dysuria. 9/3, patient was seen in the ED for complaint of urinary retention despite having a Foley catheter in place. Foley catheter was removed with subsequent pain relief. CT angiography of the chest done at that time showed bilateral pulmonary embolus without right heart strain. Patient refused admission and left AMA. At home, patient started having progressive generalized weakness, confusion and shortness of breath and hence brought back to the ED on 9/13 for further evaluation management.  In the ED, patient was hemodynamically stable. Labs showed leukocytosis, hypokalemia, creatinine 1.5.  Urinalysis was positive for largeleukocytes and WBC >50.  CT angiography of chest with contrast showed multiple bilateral acute pulmonary emboli with no CT evidence of right heart strain. Large stool within the rectal vault which appears progressive since prior exam raising the question of fecal impaction was also noted.   Patient was admitted to hospital service for further management of  multiple issues.  Subjective:  Seen and examined this morning.  Sitting up at the edge of the bed.  Ready go home.  Hospital Course:  Acute bilateral pulmonary embolism Right lower extremity DVT -CT angio chest showed multiple bilateral acute pulmonary embolism without evidence of right heart strain.  -Initially diagnosed on 9/3 however patient left AMA without anticoagulation. -Lower extremity ultrasound positive for extensive occlusive DVT extending from the right common femoral vein through the imaged right tibial veins. -No history of DVT/PE in the past -Treated in the hospital with IV heparin for 48 hours.  -Switched to oral Eliquis at discharge.  Patient needs to be on it for at least 3 to 6 months. -Outpatient follow-up with hematology for hypercoagulable work-up and the determination of the length of treatment  E. coli UTI -Complained of dysuria on admission. -Urine culture grew more than 100,000 CFU per mL of E. coli.  Pansensitive.  Currently on IV Rocephin.  Plan to discharge on 5 more days of oral Keflex. Recent Labs  Lab 03/06/20 1325 03/07/20 0312 03/08/20 0558 03/09/20 0243  WBC 13.9* 11.4* 8.1 7.1   Hypokalemia -Potassium level was low at 2.7 on presentation.  Daily replacement given.  Potassium level improved today to normal.    Recent Labs  Lab 03/06/20 1325 03/07/20 0312 03/08/20 0558 03/09/20 0243  K 2.7* 2.8* 3.2* 3.6  MG  --  2.0  --   --   PHOS  --  3.1  --   --     AKI on CKD stage II -Creatinine was elevated to 1.5 on admission. Improved with hydration. Continue to monitor as an outpatient. Recent Labs    02/13/20 0956 02/25/20 1708 03/06/20  1325 03/07/20 0312 03/08/20 0558 03/09/20 0243  BUN 22 34* 39* 33* 25* 20  CREATININE 1.29* 1.11 1.50* 1.21 1.06 1.25*   SA node dysfunction s/p ppm. -couldn't find a documented hx of A fib in chart review.  History of essential hypertension -Blood pressure currently running on the low normal  range.  Chlorthalidone remains on hold.  -Continue to monitor blood pressure as an outpatient.  I would not resume chlorthalidone at discharge at this time.  If blood pressure exceeds 140/90 at home, it can be resumed. -Echocardiogram with EF 60 to 65%  Stable to discharge home today.  Wound care:    Discharge Exam:   Vitals:   03/08/20 1550 03/08/20 1937 03/08/20 2140 03/09/20 0610  BP: 108/84  99/71 (!) 89/60  Pulse: 78  79 82  Resp: 18   16  Temp: 97.8 F (36.6 C)  98.4 F (36.9 C) 98.5 F (36.9 C)  TempSrc:      SpO2: 100% 97% 98% 98%  Weight:      Height:        Body mass index is 28.15 kg/m.  General exam: Appears calm and comfortable.  Not in physical distress Skin: No rashes, lesions or ulcers. HEENT: Atraumatic, normocephalic, supple neck, no obvious bleeding Lungs: Clear to auscultation bilaterally CVS: Regular rate and rhythm, no murmur GI/Abd soft, nontender, nondistended, bowel sound present CNS: Alert, awake, oriented to place and person Psychiatry: Mood appropriate Extremities: No pedal edema, no calf tenderness  Follow ups:   Discharge Instructions    Diet - low sodium heart healthy   Complete by: As directed    Increase activity slowly   Complete by: As directed    Increase activity slowly   Complete by: As directed       Follow-up Information    Gareth Morgan, MD Follow up.   Specialty: Family Medicine Contact information: [address] [place] [address] [phone]               Recommendations for Outpatient Follow-Up:   1. Follow-up with PCP as an outpatient  Discharge Instructions:  Follow with Primary MD Gareth Morgan, MD in 7 days   Get CBC/BMP checked in next visit within 1 week by PCP or SNF MD ( we routinely change or add medications that can affect your baseline labs and fluid status, therefore we recommend that you get the mentioned basic workup next visit with your PCP, your PCP may decide not to get  them or add new tests based on their clinical decision)  On your next visit with your PCP, please Get Medicines reviewed and adjusted.  Please request your PCP  to go over all Hospital Tests and Procedure/Radiological results at the follow up, please get all Hospital records sent to your Prim MD by signing hospital release before you go home.  Activity: As tolerated with Full fall precautions use walker/cane & assistance as needed  For Heart failure patients - Check your Weight same time everyday, if you gain over 2 pounds, or you develop in leg swelling, experience more shortness of breath or chest pain, call your Primary MD immediately. Follow Cardiac Low Salt Diet and 1.5 lit/day fluid restriction.  If you have smoked or chewed Tobacco in the last 2 yrs please stop smoking, stop any regular Alcohol  and or any Recreational drug use.  If you experience worsening of your admission symptoms, develop shortness of breath, life threatening emergency, suicidal or homicidal thoughts you must  seek medical attention immediately by calling 911 or calling your MD immediately  if symptoms less severe.  You Must read complete instructions/literature along with all the possible adverse reactions/side effects for all the Medicines you take and that have been prescribed to you. Take any new Medicines after you have completely understood and accpet all the possible adverse reactions/side effects.   Do not drive, operate heavy machinery, perform activities at heights, swimming or participation in water activities or provide baby sitting services if your were admitted for syncope or siezures until you have seen by Primary MD or a Neurologist and advised to do so again.  Do not drive when taking Pain medications.  Do not take more than prescribed Pain, Sleep and Anxiety Medications  Wear Seat belts while driving.   Please note You were cared for by a hospitalist during your hospital stay. If you have any  questions about your discharge medications or the care you received while you were in the hospital after you are discharged, you can call the unit and asked to speak with the hospitalist on call if the hospitalist that took care of you is not available. Once you are discharged, your primary care physician will handle any further medical issues. Please note that NO REFILLS for any discharge medications will be authorized once you are discharged, as it is imperative that you return to your primary care physician (or establish a relationship with a primary care physician if you do not have one) for your aftercare needs so that they can reassess your need for medications and monitor your lab values.    Allergies as of 03/09/2020   No Known Allergies     Medication List    STOP taking these medications   aspirin 81 MG tablet   chlorthalidone 25 MG tablet Commonly known as: HYGROTON     TAKE these medications   apixaban 5 MG Tabs tablet Commonly known as: ELIQUIS Take Eliquis 10 mg twice a day for 7 days followed by 5 mg twice a day for long-term.   cephALEXin 500 MG capsule Commonly known as: KEFLEX Take 1 capsule (500 mg total) by mouth every 8 (eight) hours for 3 days.   zolpidem 10 MG tablet Commonly known as: AMBIEN Take 10 mg by mouth at bedtime.       Time coordinating discharge: 35 minutes  The results of significant diagnostics from this hospitalization (including imaging, microbiology, ancillary and laboratory) are listed below for reference.    Procedures and Diagnostic Studies:   CT Angio Chest PE W and/or Wo Contrast  Result Date: 03/06/2020 CLINICAL DATA:  High probability pulmonary embolism, prior pulmonary embolism, rectal pain EXAM: CT ANGIOGRAPHY CHEST CT ABDOMEN AND PELVIS WITH CONTRAST TECHNIQUE: Multidetector CT imaging of the chest was performed using the standard protocol during bolus administration of intravenous contrast. Multiplanar CT image reconstructions  and MIPs were obtained to evaluate the vascular anatomy. Multidetector CT imaging of the abdomen and pelvis was performed using the standard protocol during bolus administration of intravenous contrast. CONTRAST:  OMNIPAQUE IOHEXOL 350 MG/ML SOLN COMPARISON:  02/25/2020 FINDINGS: CTA CHEST FINDINGS Cardiovascular: There are multiple acute pulmonary emboli again identified bilaterally. Since the prior examination, thrombus has decreased in volume, in keeping with dissolution under anticoagulation, though moderate embolic burden remains, particularly within the left lower lobe. The central pulmonary arteries are of normal caliber. Mild right ventricular dilation is unchanged without CT evidence of right heart strain as the right left ventricles are of  similar caliber and there is no shift of the intraventricular septum. Pacemaker leads are again seen within the right heart. Mild coronary artery calcification is again noted. No pericardial effusion. Thoracic aorta is ectatic in its descending segment, unchanged measuring 3.6 cm. Mediastinum/Nodes: No pathologic thoracic adenopathy. Esophagus unremarkable. Thyroid unremarkable peer Lungs/Pleura: Lungs are clear. No pleural effusion or pneumothorax. Musculoskeletal: No chest wall abnormality. No acute or significant osseous findings. Review of the MIP images confirms the above findings. CT ABDOMEN and PELVIS FINDINGS Hepatobiliary: Cholecystectomy has been performed. Mild intra and extrahepatic biliary ductal dilation is unchanged, likely representing post cholecystectomy change. Rim calcified 15 mm right subhepatic soft tissue nodules is unchanged, possibly representing postsurgical change related to cholecystectomy. The liver is otherwise unremarkable. Pancreas: Unremarkable Spleen: Unremarkable Adrenals/Urinary Tract: The adrenal glands are unremarkable. Multiple simple cortical cysts are seen bilaterally. A 5 mm nonobstructing calculus is again seen within the  lower pole of the left kidney. The kidneys are otherwise unremarkable. The bladder is circumferentially thick walled and there is mild perivesicular inflammatory stranding, unchanged from prior examination. Small bladder diverticulum again identified. Together, the findings suggest changes of at least mild bladder outlet obstruction, though the bladder is not distended. Stomach/Bowel: Stomach, small bowel, are unremarkable. There is large stool again seen within the rectal vault which appears progressive since prior examination raising the question of fecal impaction. There is severe sigmoid diverticulosis without superimposed inflammatory change. Appendix normal. No free intraperitoneal gas or fluid. Vascular/Lymphatic: Mild aortoiliac atherosclerotic calcification without evidence of aneurysm. No pathologic adenopathy within the abdomen and pelvis. Reproductive: The prostate gland is mildly enlarged. Other: None significant Musculoskeletal: Advanced degenerative changes are noted within the lumbar spine. No acute bone abnormality. Review of the MIP images confirms the above findings. IMPRESSION: 1. Multiple acute pulmonary emboli again identified bilaterally. Since the prior examination, thrombus has decreased in volume, in keeping with dissolution under anticoagulation, though moderate embolic burden remains, particularly within the left lower lobe. No CT evidence of right heart strain. 2. Large stool within the rectal vault which appears progressive since prior examination, raising the question of fecal impaction. 3. Circumferentially thick walled bladder with mild perivesicular inflammatory stranding, unchanged from prior examination. Together, the findings suggest changes of at least mild bladder outlet obstruction, though the bladder is not distended. 4. Nonobstructing left renal calculus. 5. Severe sigmoid diverticulosis without superimposed inflammatory change. Aortic Atherosclerosis (ICD10-I70.0).  Electronically Signed   By: Helyn Numbers MD   On: 03/06/2020 19:00   CT ABDOMEN PELVIS W CONTRAST  Result Date: 03/06/2020 CLINICAL DATA:  High probability pulmonary embolism, prior pulmonary embolism, rectal pain EXAM: CT ANGIOGRAPHY CHEST CT ABDOMEN AND PELVIS WITH CONTRAST TECHNIQUE: Multidetector CT imaging of the chest was performed using the standard protocol during bolus administration of intravenous contrast. Multiplanar CT image reconstructions and MIPs were obtained to evaluate the vascular anatomy. Multidetector CT imaging of the abdomen and pelvis was performed using the standard protocol during bolus administration of intravenous contrast. CONTRAST:  OMNIPAQUE IOHEXOL 350 MG/ML SOLN COMPARISON:  02/25/2020 FINDINGS: CTA CHEST FINDINGS Cardiovascular: There are multiple acute pulmonary emboli again identified bilaterally. Since the prior examination, thrombus has decreased in volume, in keeping with dissolution under anticoagulation, though moderate embolic burden remains, particularly within the left lower lobe. The central pulmonary arteries are of normal caliber. Mild right ventricular dilation is unchanged without CT evidence of right heart strain as the right left ventricles are of similar caliber and there is no shift of the  intraventricular septum. Pacemaker leads are again seen within the right heart. Mild coronary artery calcification is again noted. No pericardial effusion. Thoracic aorta is ectatic in its descending segment, unchanged measuring 3.6 cm. Mediastinum/Nodes: No pathologic thoracic adenopathy. Esophagus unremarkable. Thyroid unremarkable peer Lungs/Pleura: Lungs are clear. No pleural effusion or pneumothorax. Musculoskeletal: No chest wall abnormality. No acute or significant osseous findings. Review of the MIP images confirms the above findings. CT ABDOMEN and PELVIS FINDINGS Hepatobiliary: Cholecystectomy has been performed. Mild intra and extrahepatic biliary ductal  dilation is unchanged, likely representing post cholecystectomy change. Rim calcified 15 mm right subhepatic soft tissue nodules is unchanged, possibly representing postsurgical change related to cholecystectomy. The liver is otherwise unremarkable. Pancreas: Unremarkable Spleen: Unremarkable Adrenals/Urinary Tract: The adrenal glands are unremarkable. Multiple simple cortical cysts are seen bilaterally. A 5 mm nonobstructing calculus is again seen within the lower pole of the left kidney. The kidneys are otherwise unremarkable. The bladder is circumferentially thick walled and there is mild perivesicular inflammatory stranding, unchanged from prior examination. Small bladder diverticulum again identified. Together, the findings suggest changes of at least mild bladder outlet obstruction, though the bladder is not distended. Stomach/Bowel: Stomach, small bowel, are unremarkable. There is large stool again seen within the rectal vault which appears progressive since prior examination raising the question of fecal impaction. There is severe sigmoid diverticulosis without superimposed inflammatory change. Appendix normal. No free intraperitoneal gas or fluid. Vascular/Lymphatic: Mild aortoiliac atherosclerotic calcification without evidence of aneurysm. No pathologic adenopathy within the abdomen and pelvis. Reproductive: The prostate gland is mildly enlarged. Other: None significant Musculoskeletal: Advanced degenerative changes are noted within the lumbar spine. No acute bone abnormality. Review of the MIP images confirms the above findings. IMPRESSION: 1. Multiple acute pulmonary emboli again identified bilaterally. Since the prior examination, thrombus has decreased in volume, in keeping with dissolution under anticoagulation, though moderate embolic burden remains, particularly within the left lower lobe. No CT evidence of right heart strain. 2. Large stool within the rectal vault which appears progressive since  prior examination, raising the question of fecal impaction. 3. Circumferentially thick walled bladder with mild perivesicular inflammatory stranding, unchanged from prior examination. Together, the findings suggest changes of at least mild bladder outlet obstruction, though the bladder is not distended. 4. Nonobstructing left renal calculus. 5. Severe sigmoid diverticulosis without superimposed inflammatory change. Aortic Atherosclerosis (ICD10-I70.0). Electronically Signed   By: Helyn Numbers MD   On: 03/06/2020 19:00   US Venous Img Lower Bilateral (DVT)  Result Date: 03/07/2020 CLINICAL DATA:  Right lower extremity DVT. EXAM: BILATERAL LOWER EXTREMITY VENOUS DOPPLER ULTRASOUND TECHNIQUE: Gray-scale sonography with graded compression, as well as color Doppler and duplex ultrasound were performed to evaluate the lower extremity deep venous systems from the level of the common femoral vein and including the common femoral, femoral, profunda femoral, popliteal and calf veins including the posterior tibial, peroneal and gastrocnemius veins when visible. The superficial great saphenous vein was also interrogated. Spectral Doppler was utilized to evaluate flow at rest and with distal augmentation maneuvers in the common femoral, femoral and popliteal veins. COMPARISON:  None. FINDINGS: RIGHT LOWER EXTREMITY There is hypoechoic occlusive expansile thrombus involving the right common femoral vein (image 5), extending to involve the saphenofemoral junction (image 10) as well as the imaged portions of the right deep femoral vein (image 16). There is hypoechoic occlusive thrombus involving the proximal (image 13), mid (image 21) and distal (image 29) aspects of the right femoral vein, extending to involve the right popliteal  vein (images 33) as well as the imaged portions of the tibial veins. Other Findings:  None. LEFT LOWER EXTREMITY Common Femoral Vein: No evidence of thrombus. Normal compressibility, respiratory  phasicity and response to augmentation. Saphenofemoral Junction: No evidence of thrombus. Normal compressibility and flow on color Doppler imaging. Profunda Femoral Vein: No evidence of thrombus. Normal compressibility and flow on color Doppler imaging. Femoral Vein: No evidence of thrombus. Normal compressibility, respiratory phasicity and response to augmentation. Popliteal Vein: No evidence of thrombus. Normal compressibility, respiratory phasicity and response to augmentation. Calf Veins: No evidence of thrombus. Normal compressibility and flow on color Doppler imaging. Superficial Great Saphenous Vein: No evidence of thrombus. Normal compressibility. Venous Reflux:  None. Other Findings:  None. IMPRESSION: 1. The examination is positive for extensive occlusive DVT extending from the right common femoral vein through the imaged right tibial veins. 2. No evidence of DVT within the left lower extremity. Electronically Signed   By: Simonne Come M.D.   On: 03/07/2020 09:16   ECHOCARDIOGRAM COMPLETE  Result Date: 03/07/2020    ECHOCARDIOGRAM REPORT   Patient Name:   Luke Crosby Date of Exam: 03/07/2020 Medical Rec #:  [id number]        Height:       64.0 in Accession #:    [id number]       Weight:       180.0 lb Date of Birth:  10/17/1940       BSA:          1.871 m Patient Age:    78 years         BP:           102/75 mmHg Patient Gender: M                HR:           75 bpm. [facility] Procedure: 2D Echo, Cardiac Doppler and Color Doppler Indications:    Atrial Fibrillation 427.31 / I48.91  History:        Patient has prior history of Echocardiogram examinations, most                 recent 03/25/2006. Pacemaker, TIA, Arrythmias:Atrial                 Fibrillation; Risk Factors:Hypertension. PE (pulmonary                 thromboembolism), Altered mental status.  Sonographer:    Celesta Gentile RCS Referring Phys: [id number] OLADAPO ADEFESO IMPRESSIONS  1. Left ventricular ejection fraction, by  estimation, is 60 to 65%. The left ventricle has normal function. The left ventricle has no regional wall motion abnormalities. Left ventricular diastolic parameters are indeterminate.  2. Right ventricular systolic function is normal. The right ventricular size is normal. There is normal pulmonary artery systolic pressure.  3. Left atrial size was mildly dilated.  4. Right atrial size was mildly dilated.  5. The mitral valve is normal in structure. No evidence of mitral valve regurgitation. No evidence of mitral stenosis.  6. The aortic valve is tricuspid. Aortic valve regurgitation is mild.  7. Aortic dilatation noted. There is mild dilatation of the ascending aorta, measuring 38 mm. FINDINGS  Left Ventricle: Left ventricular ejection fraction, by estimation, is 60 to 65%. The left ventricle has normal function. The left ventricle has no regional wall motion abnormalities. The left ventricular internal cavity size was normal in size. There is  no left  ventricular hypertrophy. Left ventricular diastolic parameters are indeterminate. Right Ventricle: The right ventricular size is normal. No increase in right ventricular wall thickness. Right ventricular systolic function is normal. There is normal pulmonary artery systolic pressure. The tricuspid regurgitant velocity is 2.23 m/s, and  with an assumed right atrial pressure of 3 mmHg, the estimated right ventricular systolic pressure is 22.9 mmHg. Left Atrium: Left atrial size was mildly dilated. Right Atrium: Right atrial size was mildly dilated. Pericardium: There is no evidence of pericardial effusion. Mitral Valve: The mitral valve is normal in structure. No evidence of mitral valve regurgitation. No evidence of mitral valve stenosis. Tricuspid Valve: The tricuspid valve is normal in structure. Tricuspid valve regurgitation is mild . No evidence of tricuspid stenosis. Aortic Valve: The aortic valve is tricuspid. Aortic valve regurgitation is mild. Aortic  regurgitation PHT measures 739 msec. Aortic valve mean gradient measures 3.4 mmHg. Aortic valve peak gradient measures 6.2 mmHg. Aortic valve area, by VTI measures 1.96 cm. Pulmonic Valve: The pulmonic valve was not well visualized. Pulmonic valve regurgitation is mild. No evidence of pulmonic stenosis. Aorta: The aortic root is normal in size and structure and aortic dilatation noted. There is mild dilatation of the ascending aorta, measuring 38 mm. Pulmonary Artery: Indeterminant PASP, IVC poorly visualized. Venous: The inferior vena cava was not well visualized. IAS/Shunts: The interatrial septum was not well visualized. Additional Comments: A pacer wire is visualized.  LEFT VENTRICLE PLAX 2D LVIDd:         4.36 cm LVIDs:         2.81 cm LV PW:         0.98 cm LV IVS:        0.92 cm LVOT diam:     2.00 cm LV SV:         46 LV SV Index:   25 LVOT Area:     3.14 cm  RIGHT VENTRICLE RV S prime:     13.00 cm/s TAPSE (M-mode): 1.6 cm LEFT ATRIUM           Index       RIGHT ATRIUM           Index LA diam:      3.30 cm 1.76 cm/m  RA Area:     24.20 cm LA Vol (A2C): 56.9 ml 30.42 ml/m RA Volume:   78.20 ml  41.80 ml/m LA Vol (A4C): 52.6 ml 28.12 ml/m  AORTIC VALVE AV Area (Vmax):    2.00 cm AV Area (Vmean):   1.95 cm AV Area (VTI):     1.96 cm AV Vmax:           124.02 cm/s AV Vmean:          84.783 cm/s AV VTI:            0.236 m AV Peak Grad:      6.2 mmHg AV Mean Grad:      3.4 mmHg LVOT Vmax:         79.10 cm/s LVOT Vmean:        52.533 cm/s LVOT VTI:          0.147 m LVOT/AV VTI ratio: 0.62 AI PHT:            739 msec  AORTA Ao Root diam: 3.70 cm MITRAL VALVE               TRICUSPID VALVE MV Area (PHT): 4.06 cm    TR Peak grad:  19.9 mmHg MV Decel Time: 187 msec    TR Vmax:        223.00 cm/s MV E velocity: 94.40 cm/s                            SHUNTS                            Systemic VTI:  0.15 m                            Systemic Diam: 2.00 cm Dina Rich MD Electronically signed by Dina Rich MD Signature Date/Time: 03/07/2020/11:00:40 AM    Final      Labs:   Basic Metabolic Panel: Recent Labs  Lab 03/06/20 1325 03/06/20 1325 03/07/20 0312 03/07/20 0312 03/08/20 0558 03/09/20 0243  NA 137  --  136  --  138 138  K 2.7*   < > 2.8*   < > 3.2* 3.6  CL 93*  --  99  --  103 105  CO2 30  --  28  --  27 27  GLUCOSE 151*  --  134*  --  127* 115*  BUN 39*  --  33*  --  25* 20  CREATININE 1.50*  --  1.21  --  1.06 1.25*  CALCIUM 9.7  --  8.3*  --  8.4* 8.2*  MG  --   --  2.0  --   --   --   PHOS  --   --  3.1  --   --   --    < > = values in this interval not displayed.   GFR Estimated Creatinine Clearance: 44.3 mL/min (A) (by C-G formula based on SCr of 1.25 mg/dL (H)). Liver Function Tests: Recent Labs  Lab 03/06/20 1325 03/07/20 0312  AST 41 35  ALT 40 35  ALKPHOS 60 48  BILITOT 3.2* 1.9*  PROT 7.4 6.0*  ALBUMIN 4.0 3.1*   Recent Labs  Lab 03/06/20 1325  LIPASE 35   No results for input(s): AMMONIA in the last 168 hours. Coagulation profile Recent Labs  Lab 03/06/20 1325 03/07/20 0312  INR 1.2 1.3*    CBC: Recent Labs  Lab 03/06/20 1325 03/07/20 0312 03/08/20 0558 03/09/20 0243  WBC 13.9* 11.4* 8.1 7.1  NEUTROABS 12.2*  --   --   --   HGB 15.1 13.1 12.7* 11.7*  HCT 46.1 40.4 38.5* 36.9*  MCV 91.5 91.4 91.9 93.9  PLT 235 199 197 189   Cardiac Enzymes: No results for input(s): CKTOTAL, CKMB, CKMBINDEX, TROPONINI in the last 168 hours. BNP: Invalid input(s): POCBNP CBG: No results for input(s): GLUCAP in the last 168 hours. D-Dimer No results for input(s): DDIMER in the last 72 hours. Hgb A1c No results for input(s): HGBA1C in the last 72 hours. Lipid Profile No results for input(s): CHOL, HDL, LDLCALC, TRIG, CHOLHDL, LDLDIRECT in the last 72 hours. Thyroid function studies No results for input(s): TSH, T4TOTAL, T3FREE, THYROIDAB in the last 72 hours.  Invalid input(s): FREET3 Anemia work up No results for input(s):  VITAMINB12, FOLATE, FERRITIN, TIBC, IRON, RETICCTPCT in the last 72 hours. Microbiology Recent Results (from the past 240 hour(s))  SARS Coronavirus 2 by RT PCR (hospital order, performed in [facility] hospital lab) Nasopharyngeal Nasopharyngeal Swab     Status: None  Collection Time: 03/06/20  1:33 PM   Specimen: Nasopharyngeal Swab  Result Value Ref Range Status   SARS Coronavirus 2 NEGATIVE NEGATIVE Final    Comment: (NOTE) SARS-CoV-2 target nucleic acids are NOT DETECTED.  The SARS-CoV-2 RNA is generally detectable in upper and lower respiratory specimens during the acute phase of infection. The lowest concentration of SARS-CoV-2 viral copies this assay can detect is 250 copies / mL. A negative result does not preclude SARS-CoV-2 infection and should not be used as the sole basis for treatment or other patient management decisions.  A negative result may occur with improper specimen collection / handling, submission of specimen other than nasopharyngeal swab, presence of viral mutation(s) within the areas targeted by this assay, and inadequate number of viral copies (<250 copies / mL). A negative result must be combined with clinical observations, patient history, and epidemiological information.  Fact Sheet for Patients:   BoilerBrush.com.cy  Fact Sheet for Healthcare Providers: https://pope.com/  This test is not yet approved or  cleared by the [place] FDA and has been authorized for detection and/or diagnosis of SARS-CoV-2 by FDA under an Emergency Use Authorization (EUA).  This EUA will remain in effect (meaning this test can be used) for the duration of the COVID-19 declaration under Section 564(b)(1) of the Act, 21 U.S.C. section 360bbb-3(b)(1), unless the authorization is terminated or revoked sooner.  Performed at [facility], [address] [place], [place] [address]   Urine culture     Status: Abnormal  (Preliminary result)   Collection Time: 03/06/20  5:58 PM   Specimen: Urine, Clean Catch  Result Value Ref Range Status   Specimen Description   Final    URINE, CLEAN CATCH Performed at [facility], [address], [place], [place] [address]    Special Requests   Final    NONE Performed at [facility], [address], [place], [place] [address]    Culture (A)  Final    >=100,000 COLONIES/mL ESCHERICHIA COLI CULTURE REINCUBATED FOR BETTER GROWTH Performed at [facility], [address] [place], [place] [address]    Report Status PENDING  Incomplete   Organism ID, Bacteria ESCHERICHIA COLI (A)  Final      Susceptibility   Escherichia coli - MIC*    AMPICILLIN 8 SENSITIVE Sensitive     CEFAZOLIN <=4 SENSITIVE Sensitive     CEFTRIAXONE <=0.25 SENSITIVE Sensitive     CIPROFLOXACIN <=0.25 SENSITIVE Sensitive     GENTAMICIN <=1 SENSITIVE Sensitive     IMIPENEM <=0.25 SENSITIVE Sensitive     NITROFURANTOIN 32 SENSITIVE Sensitive     TRIMETH/SULFA <=20 SENSITIVE Sensitive     AMPICILLIN/SULBACTAM 4 SENSITIVE Sensitive     PIP/TAZO <=4 SENSITIVE Sensitive     * >=100,000 COLONIES/mL ESCHERICHIA COLI     Signed: Abella Shugart  [facility] 03/09/2020, 10:32 AM

## 2020-03-09 NOTE — Progress Notes (Signed)
Nsg Discharge Note  Admit Date:  03/06/2020 Discharge date: 03/09/2020   Roxan Hockey to be D/C'd Home per MD order.  AVS completed.  Copy for chart, and copy for patient signed, and dated. Patient/caregiver able to verbalize understanding.  Discharge Medication: Allergies as of 03/09/2020   No Known Allergies     Medication List    STOP taking these medications   aspirin 81 MG tablet   chlorthalidone 25 MG tablet Commonly known as: HYGROTON     TAKE these medications   apixaban 5 MG Tabs tablet Commonly known as: ELIQUIS Take Eliquis 10 mg twice a day for 7 days followed by 5 mg twice a day for long-term.   cephALEXin 500 MG capsule Commonly known as: KEFLEX Take 1 capsule (500 mg total) by mouth every 8 (eight) hours for 3 days.   zolpidem 10 MG tablet Commonly known as: AMBIEN Take 10 mg by mouth at bedtime.       Discharge Assessment: Vitals:   03/08/20 2140 03/09/20 0610  BP: 99/71 (!) 89/60  Pulse: 79 82  Resp:  16  Temp: 98.4 F (36.9 C) 98.5 F (36.9 C)  SpO2: 98% 98%   Skin clean, dry and intact without evidence of skin break down, no evidence of skin tears noted. IV catheter discontinued intact. Site without signs and symptoms of complications - no redness or edema noted at insertion site, patient denies c/o pain - only slight tenderness at site.  Dressing with slight pressure applied.  D/c Instructions-Education: Discharge instructions given to patient/family with verbalized understanding. D/c education completed with patient/family including follow up instructions, medication list, d/c activities limitations if indicated, with other d/c instructions as indicated by MD - patient able to verbalize understanding, all questions fully answered. Patient instructed to return to ED, call 911, or call MD for any changes in condition.  Patient escorted via WC, and D/C home via private auto.  Andria Rhein, RN 03/09/2020 11:16 AM

## 2020-03-10 LAB — URINE CULTURE: Culture: >=100000 — AB

## 2020-03-20 ENCOUNTER — Encounter (HOSPITAL_COMMUNITY): Payer: Self-pay | Admitting: Emergency Medicine

## 2020-03-20 ENCOUNTER — Other Ambulatory Visit: Payer: Self-pay

## 2020-03-20 ENCOUNTER — Observation Stay (HOSPITAL_COMMUNITY)
Admission: EM | Admit: 2020-03-20 | Discharge: 2020-03-24 | Disposition: A | Discharge status: Home / Self Care | Payer: Medicare HMO | Attending: Family Medicine | Admitting: Family Medicine

## 2020-03-20 ENCOUNTER — Emergency Department (HOSPITAL_COMMUNITY): Payer: Medicare HMO

## 2020-03-20 DIAGNOSIS — Z7901 Long term (current) use of anticoagulants: Secondary | ICD-10-CM | POA: Diagnosis not present

## 2020-03-20 DIAGNOSIS — R2242 Localized swelling, mass and lump, left lower limb: Secondary | ICD-10-CM | POA: Diagnosis present

## 2020-03-20 DIAGNOSIS — Z20822 Contact with and (suspected) exposure to covid-19: Secondary | ICD-10-CM | POA: Insufficient documentation

## 2020-03-20 DIAGNOSIS — I495 Sick sinus syndrome: Secondary | ICD-10-CM | POA: Diagnosis present

## 2020-03-20 DIAGNOSIS — I10 Essential (primary) hypertension: Secondary | ICD-10-CM | POA: Diagnosis present

## 2020-03-20 DIAGNOSIS — I82411 Acute embolism and thrombosis of right femoral vein: Secondary | ICD-10-CM

## 2020-03-20 DIAGNOSIS — M109 Gout, unspecified: Secondary | ICD-10-CM | POA: Diagnosis not present

## 2020-03-20 DIAGNOSIS — Z87891 Personal history of nicotine dependence: Secondary | ICD-10-CM | POA: Diagnosis not present

## 2020-03-20 DIAGNOSIS — I4891 Unspecified atrial fibrillation: Secondary | ICD-10-CM | POA: Diagnosis present

## 2020-03-20 DIAGNOSIS — I2699 Other pulmonary embolism without acute cor pulmonale: Secondary | ICD-10-CM | POA: Diagnosis present

## 2020-03-20 DIAGNOSIS — D696 Thrombocytopenia, unspecified: Secondary | ICD-10-CM

## 2020-03-20 DIAGNOSIS — L03116 Cellulitis of left lower limb: Secondary | ICD-10-CM | POA: Diagnosis present

## 2020-03-20 DIAGNOSIS — M79672 Pain in left foot: Secondary | ICD-10-CM

## 2020-03-20 LAB — CBC
HCT: 43 % (ref 39.0–52.0)
Hemoglobin: 13.4 g/dL (ref 13.0–17.0)
MCH: 29.5 pg (ref 26.0–34.0)
MCHC: 31.2 g/dL (ref 30.0–36.0)
MCV: 94.5 fL (ref 80.0–100.0)
Platelets: 142 10*3/uL — ABNORMAL LOW (ref 150–400)
RBC: 4.55 MIL/uL (ref 4.22–5.81)
RDW: 13.2 % (ref 11.5–15.5)
WBC: 7.8 10*3/uL (ref 4.0–10.5)
nRBC: 0 % (ref 0.0–0.2)

## 2020-03-20 LAB — BASIC METABOLIC PANEL
Anion gap: 9 (ref 5–15)
BUN: 19 mg/dL (ref 8–23)
CO2: 26 mmol/L (ref 22–32)
Calcium: 9.3 mg/dL (ref 8.9–10.3)
Chloride: 100 mmol/L (ref 98–111)
Creatinine, Ser: 1.07 mg/dL (ref 0.61–1.24)
GFR calc Af Amer: >60 mL/min (ref >60)
GFR calc non Af Amer: >60 mL/min (ref >60)
Glucose, Bld: 111 mg/dL — ABNORMAL HIGH (ref 70–99)
Potassium: 3.6 mmol/L (ref 3.5–5.1)
Sodium: 135 mmol/L (ref 135–145)

## 2020-03-20 NOTE — ED Triage Notes (Signed)
Pt reports bilateral leg swelling. Redness noted to left foot. Pt reports seen for similar on Monday and reports was being treated for DVT in left leg. Pt denies shortness of breath but reports increased urination. Pt reports swelling and pain has increased. Pt unable to bear weight on lower extremities.

## 2020-03-21 ENCOUNTER — Encounter (HOSPITAL_COMMUNITY): Payer: Self-pay | Admitting: Internal Medicine

## 2020-03-21 ENCOUNTER — Emergency Department (HOSPITAL_COMMUNITY): Payer: Medicare HMO

## 2020-03-21 DIAGNOSIS — M109 Gout, unspecified: Secondary | ICD-10-CM

## 2020-03-21 DIAGNOSIS — I82411 Acute embolism and thrombosis of right femoral vein: Secondary | ICD-10-CM | POA: Diagnosis not present

## 2020-03-21 DIAGNOSIS — L03116 Cellulitis of left lower limb: Secondary | ICD-10-CM

## 2020-03-21 DIAGNOSIS — I495 Sick sinus syndrome: Secondary | ICD-10-CM

## 2020-03-21 DIAGNOSIS — D696 Thrombocytopenia, unspecified: Secondary | ICD-10-CM

## 2020-03-21 DIAGNOSIS — I10 Essential (primary) hypertension: Secondary | ICD-10-CM | POA: Diagnosis not present

## 2020-03-21 DIAGNOSIS — I2699 Other pulmonary embolism without acute cor pulmonale: Secondary | ICD-10-CM

## 2020-03-21 DIAGNOSIS — I4891 Unspecified atrial fibrillation: Secondary | ICD-10-CM

## 2020-03-21 LAB — COMPREHENSIVE METABOLIC PANEL
ALT: 15 U/L (ref 0–44)
AST: 16 U/L (ref 15–41)
Albumin: 3 g/dL — ABNORMAL LOW (ref 3.5–5.0)
Alkaline Phosphatase: 48 U/L (ref 38–126)
Anion gap: 8 (ref 5–15)
BUN: 20 mg/dL (ref 8–23)
CO2: 26 mmol/L (ref 22–32)
Calcium: 9 mg/dL (ref 8.9–10.3)
Chloride: 102 mmol/L (ref 98–111)
Creatinine, Ser: 1.05 mg/dL (ref 0.61–1.24)
GFR calc Af Amer: >60 mL/min (ref >60)
GFR calc non Af Amer: >60 mL/min (ref >60)
Glucose, Bld: 107 mg/dL — ABNORMAL HIGH (ref 70–99)
Potassium: 3.7 mmol/L (ref 3.5–5.1)
Sodium: 136 mmol/L (ref 135–145)
Total Bilirubin: 2.7 mg/dL — ABNORMAL HIGH (ref 0.3–1.2)
Total Protein: 6.4 g/dL — ABNORMAL LOW (ref 6.5–8.1)

## 2020-03-21 LAB — CBC
HCT: 39.5 % (ref 39.0–52.0)
Hemoglobin: 12.6 g/dL — ABNORMAL LOW (ref 13.0–17.0)
MCH: 29.9 pg (ref 26.0–34.0)
MCHC: 31.9 g/dL (ref 30.0–36.0)
MCV: 93.6 fL (ref 80.0–100.0)
Platelets: 126 10*3/uL — ABNORMAL LOW (ref 150–400)
RBC: 4.22 MIL/uL (ref 4.22–5.81)
RDW: 13.2 % (ref 11.5–15.5)
WBC: 6.2 10*3/uL (ref 4.0–10.5)
nRBC: 0 % (ref 0.0–0.2)

## 2020-03-21 LAB — SEDIMENTATION RATE: Sed Rate: 39 mm/hr — ABNORMAL HIGH (ref 0–16)

## 2020-03-21 LAB — RESPIRATORY PANEL BY RT PCR (FLU A&B, COVID)
Influenza A by PCR: NEGATIVE
Influenza B by PCR: NEGATIVE
SARS Coronavirus 2 by RT PCR: NEGATIVE

## 2020-03-21 LAB — PROTIME-INR
INR: 1.8 — ABNORMAL HIGH (ref 0.8–1.2)
Prothrombin Time: 20 seconds — ABNORMAL HIGH (ref 11.4–15.2)

## 2020-03-21 LAB — C-REACTIVE PROTEIN: CRP: 15.6 mg/dL — ABNORMAL HIGH (ref <1.0)

## 2020-03-21 LAB — URIC ACID: Uric Acid, Serum: 5.6 mg/dL (ref 3.7–8.6)

## 2020-03-21 LAB — APTT: aPTT: 52 seconds — ABNORMAL HIGH (ref 24–36)

## 2020-03-21 MED ORDER — CEFAZOLIN SODIUM-DEXTROSE 1-4 GM/50ML-% IV SOLN
1.0000 g | Freq: Three times a day (TID) | INTRAVENOUS | Status: DC
  Administered 2020-03-22 – 2020-03-24 (×7): 1 g via INTRAVENOUS
  Filled 2020-03-21 (×17): qty 50

## 2020-03-21 MED ORDER — ACETAMINOPHEN 650 MG RE SUPP: 650.0000 mg | Freq: Four times a day (QID) | RECTAL | Status: DC | PRN

## 2020-03-21 MED ORDER — ACETAMINOPHEN 325 MG PO TABS
650.0000 mg | ORAL_TABLET | Freq: Four times a day (QID) | ORAL | Status: DC | PRN
  Administered 2020-03-22 – 2020-03-24 (×4): 650 mg via ORAL
  Filled 2020-03-21 (×4): qty 2

## 2020-03-21 MED ORDER — APIXABAN 5 MG PO TABS
5.0000 mg | ORAL_TABLET | Freq: Two times a day (BID) | ORAL | Status: DC
  Administered 2020-03-21 – 2020-03-24 (×7): 5 mg via ORAL
  Filled 2020-03-21 (×7): qty 1

## 2020-03-21 MED ORDER — SODIUM CHLORIDE 0.9 % IV SOLN
1.0000 g | INTRAVENOUS | Status: DC
  Administered 2020-03-21: 1 g via INTRAVENOUS
  Filled 2020-03-21: qty 10

## 2020-03-21 MED ORDER — COLCHICINE 0.6 MG PO TABS
0.6000 mg | ORAL_TABLET | Freq: Two times a day (BID) | ORAL | Status: DC
  Administered 2020-03-21 – 2020-03-24 (×7): 0.6 mg via ORAL
  Filled 2020-03-21 (×7): qty 1

## 2020-03-21 NOTE — Care Management Obs Status (Signed)
MEDICARE OBSERVATION STATUS NOTIFICATION   Patient Details  Name: Luke Crosby MRN: [id number] Date of Birth: 07/13/1940   Medicare Observation Status Notification Given:  Yes    Ewing Schlein, LCSW 03/21/2020, 3:24 PM

## 2020-03-21 NOTE — Plan of Care (Signed)
  Problem: Acute Rehab PT Goals(only PT should resolve) Goal: Pt Will Go Supine/Side To Sit Outcome: Progressing Flowsheets (Taken 03/21/2020 1137) Pt will go Supine/Side to Sit: Independently Goal: Patient Will Transfer Sit To/From Stand Outcome: Progressing Flowsheets (Taken 03/21/2020 1137) Patient will transfer sit to/from stand: with min guard assist Goal: Pt Will Transfer Bed To Chair/Chair To Bed Outcome: Progressing Flowsheets (Taken 03/21/2020 1137) Pt will Transfer Bed to Chair/Chair to Bed: min guard assist Goal: Pt Will Ambulate Outcome: Progressing Flowsheets (Taken 03/21/2020 1137) Pt will Ambulate:  25 feet  with min guard assist  with minimal assist  with rolling walker   11:37 AM, 03/21/20 Ocie Bob, MPT Physical Therapist with [facility] [phone] office [phone] mobile phone

## 2020-03-21 NOTE — ED Notes (Addendum)
This RN to bedside for medication and rounding. On arrival to room, found patient to be sitting in floor with pants off. When found, patient reports to this RN "I wanted to get up and change my clothes. I didn't need any help." When asked further about the event, this RN notes that pt is alert and oriented, no signs of injury noted. Pt was assisted from chair to bed. Pt states "I know what I was doing. I didn't need y'alls help and I wasn't gonna ask." This RN found bed locked in the lowest position, side rails x2, call bell on the bed.   10:57 PM Dr. Renford Dills paged and made aware of patient's fall. Awaiting a page back at this time.

## 2020-03-21 NOTE — ED Provider Notes (Signed)
[facility] EMERGENCY DEPARTMENT Provider Note   CSN: [id number] Arrival date & time: 03/20/20  1558   Time seen 12:34 AM  History Chief Complaint  Patient presents with  . Leg Swelling    Luke Crosby is a 79 y.o. male.  HPI   Patient states he was seen in the ED on September 13 because he had "reversed to myself". When asked what that meant he just kept saying "I reversed myself" and that "you'er a doctor you should know what that means". He states he is not having diarrhea or constipation. He is not having difficulty urinating. He states he got medication and then that reversal reversed itself. He was discharged on the sixteenth. Looking at his chart he had a PE and DVT in his left leg, urinary tract infection. He states Wednesday, September 23 he started having some redness and pain once in a while and his left foot. He denies any fever. He denies any history of gout. He denies chest pain, shortness of breath, nausea, or vomiting. He states he has been using a walker but today he cannot walk because he cannot put any weight on that leg.  PCP Gareth Morgan, MD   Past Medical History:  Diagnosis Date  . Altered mental status 05/29/2015  . Hypertension   . Sinus node dysfunction [facility])     Patient Active Problem List   Diagnosis Date Noted  . Pulmonary embolism (HCC) 03/07/2020  . PE (pulmonary thromboembolism) (HCC) 03/06/2020  . Constipation 03/06/2020  . Essential hypertension 03/06/2020  . Hypokalemia 03/06/2020  . Elevated serum creatinine 03/06/2020  . Elevated BUN 03/06/2020  . Serum total bilirubin elevated 03/06/2020  . Leukocytosis 03/06/2020  . UTI (urinary tract infection) 03/06/2020  . Dehydration 03/06/2020  . TIA (transient ischemic attack) 05/30/2015  . Altered mental status 05/29/2015  . Elevated blood pressure 06/03/2013  . 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 Syncope 04/26/2011  . Atrial fibrillation (HCC) 04/12/2010  . Sinoatrial node dysfunction  (HCC) 04/14/2009    Past Surgical History:  Procedure Laterality Date  . INSERT / REPLACE / REMOVE PACEMAKER  ~ 2009   Medtronic Versa VEDro1  . LAPAROSCOPIC CHOLECYSTECTOMY         Family History  Problem Relation Age of Onset  . Hypertension Sister     Social History   Tobacco Use  . Smoking status: Never Smoker  . Smokeless tobacco: Former Neurosurgeon    Types: Chew  . Tobacco comment: 05/30/2015 "chewed tobacco for 40 yrs; quit early 2000's"  Substance Use Topics  . Alcohol use: No  . Drug use: No  Son lives with patient  Home Medications Prior to Admission medications   Medication Sig Start Date End Date Taking? Authorizing Provider  apixaban (ELIQUIS) 5 MG TABS tablet Take Eliquis 10 mg twice a day for 7 days followed by 5 mg twice a day for long-term. 03/09/20   Lorin Glass, MD  zolpidem (AMBIEN) 10 MG tablet Take 10 mg by mouth at bedtime.     [provider]    Allergies    Patient has no known allergies.  Review of Systems   Review of Systems  All other systems reviewed and are negative.   Physical Exam Updated Vital Signs BP (!) 115/104 (BP Location: Right Arm)   Pulse 86   Temp 98.9 F (37.2 C) (Oral)   Resp 18   Ht 5\' 4"  (1.626 m)   Wt 74 kg   SpO2 92%   BMI  28.00 kg/m   Physical Exam Vitals and nursing note reviewed.  Constitutional:      General: He is not in acute distress.    Appearance: Normal appearance. He is obese. He is not ill-appearing or toxic-appearing.  HENT:     Head: Normocephalic and atraumatic.     Right Ear: External ear normal.     Left Ear: External ear normal.  Eyes:     Extraocular Movements: Extraocular movements intact.     Conjunctiva/sclera: Conjunctivae normal.  Cardiovascular:     Rate and Rhythm: Normal rate and regular rhythm.     Pulses: Normal pulses.  Pulmonary:     Effort: Pulmonary effort is normal. No respiratory distress.     Breath sounds: Normal breath sounds.  Musculoskeletal:         General: Swelling present.     Cervical back: Normal range of motion.     Comments: Patient has a lot of redness and swelling that seems to start at the MTP joint of his left great toe. There is some redness over the distal medial aspect of his left foot. There is warmth to the skin. He is noted to have diffuse swelling of both lower extremities but he denies having any pain to palpation in his calves or in his lower extremities except in his left foot.  Skin:    General: Skin is warm and dry.     Findings: Erythema present.  Neurological:     Mental Status: He is alert.       ED Results / Procedures / Treatments   Labs (all labs ordered are listed, but only abnormal results are displayed) Results for orders placed or performed during the hospital encounter of 03/20/20  Basic metabolic panel  Result Value Ref Range   Sodium 135 135 - 145 mmol/L   Potassium 3.6 3.5 - 5.1 mmol/L   Chloride 100 98 - 111 mmol/L   CO2 26 22 - 32 mmol/L   Glucose, Bld 111 (H) 70 - 99 mg/dL   BUN 19 8 - 23 mg/dL   Creatinine, Ser 1.611.07 0.61 - 1.24 mg/dL   Calcium 9.3 8.9 - 09.610.3 mg/dL   GFR calc non Af Amer >60 >60 mL/min   GFR calc Af Amer >60 >60 mL/min   Anion gap 9 5 - 15  CBC  Result Value Ref Range   WBC 7.8 4.0 - 10.5 K/uL   RBC 4.55 4.22 - 5.81 MIL/uL   Hemoglobin 13.4 13.0 - 17.0 g/dL   HCT 04.543.0 39 - 52 %   MCV 94.5 80.0 - 100.0 fL   MCH 29.5 26.0 - 34.0 pg   MCHC 31.2 30.0 - 36.0 g/dL   RDW 40.913.2 81.111.5 - 91.415.5 %   Platelets 142 (L) 150 - 400 K/uL   nRBC 0.0 0.0 - 0.2 %  Uric acid  Result Value Ref Range   Uric Acid, Serum 5.6 3.7 - 8.6 mg/dL  Sedimentation rate  Result Value Ref Range   Sed Rate 39 (H) 0 - 16 mm/hr  C-reactive protein  Result Value Ref Range   CRP 15.6 (H) <1.0 mg/dL   Laboratory interpretation all normal except elevated CRP and sed rate, however his uric acid level is normal   CT Angio Chest PE W and/or Wo Contrast  Result Date: 03/06/2020 CLINICAL DATA:  High  probability pulmonary embolism, prior pulmonary embolism, rectal painIMPRESSION: 1. Multiple acute pulmonary emboli again identified bilaterally. Since the prior examination, thrombus has decreased  in volume, in keeping with dissolution under anticoagulation, though moderate embolic burden remains, particularly within the left lower lobe. No CT evidence of right heart strain. 2. Large stool within the rectal vault which appears progressive since prior examination, raising the question of fecal impaction. 3. Circumferentially thick walled bladder with mild perivesicular inflammatory stranding, unchanged from prior examination. Together, the findings suggest changes of at least mild bladder outlet obstruction, though the bladder is not distended. 4. Nonobstructing left renal calculus. 5. Severe sigmoid diverticulosis without superimposed inflammatory change. Aortic Atherosclerosis (ICD10-I70.0). Electronically Signed   By: Helyn Numbers MD   On: 03/06/2020 19:00   CT Angio Chest PE W/Cm &/Or Wo Cm  CT Abdomen Pelvis W Contrast  Result Date: 02/25/2020  IMPRESSION: Bilateral lower lobe pulmonary emboli. Sigmoid diverticulosis.  No active diverticulitis. Large stool burden in the rectum which could reflect fecal impaction. Prostate enlargement with bladder wall thickening and bladder wall diverticulum. Aortic atherosclerosis. Critical Value/emergent results were called by telephone at the time of interpretation on 02/25/2020 at 7:23 pm to provider Vanetta Mulders , who verbally acknowledged these results. Electronically Signed   By: Charlett Nose M.D.   On: 02/25/2020 19:26   US Venous Img Lower Bilateral (DVT)  Result Date: 03/07/2020 CLINICAL DATA:  Right lower extremity DVT. EXAM: BILATERAL  IMPRESSION: 1. The examination is positive for extensive occlusive DVT extending from the right common femoral vein through the imaged right tibial veins. 2. No evidence of DVT within the left lower extremity.  Electronically Signed   By: Simonne Come M.D.   On: 03/07/2020 09:16   DG Foot Complete Left  Result Date: 03/21/2020 CLINICAL DATA:  Redness, swelling, and pain in the great toe.  IMPRESSION: Right hallux valgus deformity with degenerative changes in the first metatarsal-phalangeal joint. Focal lucency in the medial aspect of the first metatarsal head is likely degenerative cyst. Gout or infection would be another possibility. Soft tissue swelling. Electronically Signed   By: Burman Nieves M.D.   On: 03/21/2020 01:06   ECHOCARDIOGRAM COMPLETE  Result Date: 03/07/2020    ECHOCARDIOGRAM REPORT   Patient Name:   Luke Crosby Date of Exam: 03/07/2020 Medical Rec #:  [id number]        Height:       64.0 in Accession #:    [id number]       Weight:       180.0 lb Date of Birth:  [birth year]       BSA:          1.871 m Patient Age:    78 years         BP:           102/75 mmHg Patient Gender: M                HR:           75 bpm. [facility] Procedure: 2D Echo, Cardiac Doppler and Color Doppler Indications:    Atrial Fibrillation 427.31 / I48.91  History:        Patient has prior history of Echocardiogram examinations, most                 recent 03/25/2006. Pacemaker, TIA, Arrythmias:Atrial                 Fibrillation; Risk Factors:Hypertension. PE (pulmonary                 thromboembolism), Altered mental status. FINDINGS  Left Ventricle: Left ventricular ejection fraction, by estimation, is 60 to 65%. The left ventricle has normal function. The left ventricle has no regional wall motion abnormalities. The left ventricular internal cavity size was normal in size. There is  no left ventricular hypertrophy. Left ventricular diastolic parameters are indeterminate. Right Ventricle: The right ventricular size is normal. No increase in right ventricular wall thickness. Right ventricular systolic function is normal. There is normal pulmonary artery systolic pressure. The tricuspid regurgitant  velocity is 2.23 m/s, and  with an assumed right atrial pressure of 3 mmHg, the estimated right ventricular systolic pressure is 22.9 mmHg. Left Atrium: Left atrial size was mildly dilated. Right Atrium: Right atrial size was mildly dilated. Pericardium: There is no evidence of pericardial effusion. Mitral Valve: The mitral valve is normal in structure. No evidence of mitral valve regurgitation. No evidence of mitral valve stenosis. Tricuspid Valve: The tricuspid valve is normal in structure. Tricuspid valve regurgitation is mild . No evidence of tricuspid stenosis. Aortic Valve: The aortic valve is tricuspid. Aortic valve regurgitation is mild. Aortic regurgitation PHT measures 739 msec. Aortic valve mean gradient measures 3.4 mmHg. Aortic valve peak gradient measures 6.2 mmHg. Aortic valve area, by VTI measures 1.96 cm. Pulmonic Valve: The pulmonic valve was not well visualized. Pulmonic valve regurgitation is mild. No evidence of pulmonic stenosis. Aorta: The aortic root is normal in size and structure and aortic dilatation noted. There is mild dilatation of the ascending aorta, measuring 38 mm. Pulmonary Artery: Indeterminant PASP, IVC poorly visualized. Venous: The inferior vena cava was not well visualized. IAS/Shunts: The interatrial septum was not well visualized. Additional Comments: A pacer wire is visualized                 Dina Rich MD Electronically signed by Dina Rich MD Signature Date/Time: 03/07/2020/11:00:40 AM    Final      EKG EKG Interpretation  Date/Time:  Monday March 20 2020 18:06:13 EDT Ventricular Rate:  116 PR Interval:    QRS Duration: 120 QT Interval:  330 QTC Calculation: 458 R Axis:   -69 Text Interpretation: Atrial fibrillation with rapid ventricular response Left axis deviation Right bundle branch block Abnormal ECG Confirmed by Gerhard Munch [phone]) on 03/20/2020 6:45:40 PM   Radiology DG Chest 2 View  Result Date: 03/20/2020 CLINICAL DATA:  Leg  swelling. EXAM: CHEST - 2 VIEW COMPARISON:  May 29, 2015. FINDINGS: The heart size and mediastinal contours are within normal limits. Both lungs are clear. No pneumothorax or pleural effusion is noted. Left-sided pacemaker is unchanged in position. The visualized skeletal structures are unremarkable. IMPRESSION: No active cardiopulmonary disease. Electronically Signed   By: Lupita Raider M.D.   On: 03/20/2020 18:51   DG Foot Complete Left  Result Date: 03/21/2020 CLINICAL DATA:  Redness, swelling, and pain in the great toe. EXAM: LEFT FOOT - COMPLETE 3+ VIEW COMPARISON:  None. FINDINGS: Right hallux valgus deformity with degenerative changes in the first metatarsal-phalangeal joint. Focal lucency is suggested in the medial aspect of the metatarsal head. This is likely degenerative cyst. Gout or infection would be another possibility. No definite cortical disruption. Overlying soft tissue swelling is present. Dorsal soft tissue swelling also present. No soft tissue gas identified. No acute fracture or dislocation. IMPRESSION: Right hallux valgus deformity with degenerative changes in the first metatarsal-phalangeal joint. Focal lucency in the medial aspect of the first metatarsal head is likely degenerative cyst. Gout or infection would be another possibility. Soft tissue swelling. Electronically  Signed   By: Burman Nieves M.D.   On: 03/21/2020 01:06     CT Angio Chest PE W and/or Wo Contrast  Result Date: 03/06/2020 CLINICAL DATA:  High probability pulmonary embolism, prior pulmonary embolism, rectal painIMPRESSION: 1. Multiple acute pulmonary emboli again identified bilaterally. Since the prior examination, thrombus has decreased in volume, in keeping with dissolution under anticoagulation, though moderate embolic burden remains, particularly within the left lower lobe. No CT evidence of right heart strain. 2. Large stool within the rectal vault which appears progressive since prior examination,  raising the question of fecal impaction. 3. Circumferentially thick walled bladder with mild perivesicular inflammatory stranding, unchanged from prior examination. Together, the findings suggest changes of at least mild bladder outlet obstruction, though the bladder is not distended. 4. Nonobstructing left renal calculus. 5. Severe sigmoid diverticulosis without superimposed inflammatory change. Aortic Atherosclerosis (ICD10-I70.0). Electronically Signed   By: Helyn Numbers MD   On: 03/06/2020 19:00   CT Angio Chest PE W/Cm &/Or Wo Cm  CT Abdomen Pelvis W Contrast  Result Date: 02/25/2020  IMPRESSION: Bilateral lower lobe pulmonary emboli. Sigmoid diverticulosis.  No active diverticulitis. Large stool burden in the rectum which could reflect fecal impaction. Prostate enlargement with bladder wall thickening and bladder wall diverticulum. Aortic atherosclerosis. Critical Value/emergent results were called by telephone at the time of interpretation on 02/25/2020 at 7:23 pm to provider Vanetta Mulders , who verbally acknowledged these results. Electronically Signed   By: Charlett Nose M.D.   On: 02/25/2020 19:26   US Venous Img Lower Bilateral (DVT)  Result Date: 03/07/2020 CLINICAL DATA:  Right lower extremity DVT. EXAM: BILATERAL  IMPRESSION: 1. The examination is positive for extensive occlusive DVT extending from the right common femoral vein through the imaged right tibial veins. 2. No evidence of DVT within the left lower extremity. Electronically Signed   By: Simonne Come M.D.   On: 03/07/2020 09:16   DG Foot Complete Left  Result Date: 03/21/2020 CLINICAL DATA:  Redness, swelling, and pain in the great toe.  IMPRESSION: Right hallux valgus deformity with degenerative changes in the first metatarsal-phalangeal joint. Focal lucency in the medial aspect of the first metatarsal head is likely degenerative cyst. Gout or infection would be another possibility. Soft tissue swelling. Electronically Signed    By: Burman Nieves M.D.   On: 03/21/2020 01:06   ECHOCARDIOGRAM COMPLETE  Result Date: 03/07/2020    ECHOCARDIOGRAM REPORT   Patient Name:   Luke Crosby Date of Exam: 03/07/2020 Medical Rec #:  [id number]        Height:       64.0 in Accession #:    [id number]       Weight:       180.0 lb Date of Birth:  1940/08/17       BSA:          1.871 m Patient Age:    78 years         BP:           102/75 mmHg Patient Gender: M                HR:           75 bpm. [facility] Procedure: 2D Echo, Cardiac Doppler and Color Doppler Indications:    Atrial Fibrillation 427.31 / I48.91  History:        Patient has prior history of Echocardiogram examinations, most  recent 03/25/2006. Pacemaker, TIA, Arrythmias:Atrial                 Fibrillation; Risk Factors:Hypertension. PE (pulmonary                 thromboembolism), Altered mental status. FINDINGS  Left Ventricle: Left ventricular ejection fraction, by estimation, is 60 to 65%. The left ventricle has normal function. The left ventricle has no regional wall motion abnormalities. The left ventricular internal cavity size was normal in size. There is  no left ventricular hypertrophy. Left ventricular diastolic parameters are indeterminate. Right Ventricle: The right ventricular size is normal. No increase in right ventricular wall thickness. Right ventricular systolic function is normal. There is normal pulmonary artery systolic pressure. The tricuspid regurgitant velocity is 2.23 m/s, and  with an assumed right atrial pressure of 3 mmHg, the estimated right ventricular systolic pressure is 22.9 mmHg. Left Atrium: Left atrial size was mildly dilated. Right Atrium: Right atrial size was mildly dilated. Pericardium: There is no evidence of pericardial effusion. Mitral Valve: The mitral valve is normal in structure. No evidence of mitral valve regurgitation. No evidence of mitral valve stenosis. Tricuspid Valve: The tricuspid valve is normal in  structure. Tricuspid valve regurgitation is mild . No evidence of tricuspid stenosis. Aortic Valve: The aortic valve is tricuspid. Aortic valve regurgitation is mild. Aortic regurgitation PHT measures 739 msec. Aortic valve mean gradient measures 3.4 mmHg. Aortic valve peak gradient measures 6.2 mmHg. Aortic valve area, by VTI measures 1.96 cm. Pulmonic Valve: The pulmonic valve was not well visualized. Pulmonic valve regurgitation is mild. No evidence of pulmonic stenosis. Aorta: The aortic root is normal in size and structure and aortic dilatation noted. There is mild dilatation of the ascending aorta, measuring 38 mm. Pulmonary Artery: Indeterminant PASP, IVC poorly visualized. Venous: The inferior vena cava was not well visualized. IAS/Shunts: The interatrial septum was not well visualized. Additional Comments: A pacer wire is visualized                 Dina Rich MD Electronically signed by Dina Rich MD Signature Date/Time: 03/07/2020/11:00:40 AM    Final       Procedures Procedures (including critical care time)  Medications Ordered in ED Medications - No data to display  ED Course  I have reviewed the triage vital signs and the nursing notes.  Pertinent labs & imaging results that were available during my care of the patient were reviewed by me and considered in my medical decision making (see chart for details).    MDM Rules/Calculators/A&P                         I added uric acid to his lab work to check for gout, he seems to have a lot of pain and swelling around the MTP joint of the great toe which would be consistent with gout.  I also added sed rate and CRP.  Patient's uric acid is normal however his sed rate and CRP are elevated.  His temperature is normal although on the high side of normal and his white count is normal.  It is not clear to me whether he is having an acute gout flareup with a normal uric acid or if he has a septic joint or cellulitis.  I will talk to  the hospitalist about admission.  16:10-96:04 Dr Thomes Dinning, hospitalist, will admit   Final Clinical Impression(s) / ED Diagnoses Final diagnoses:  Left foot pain  Rx / DC Orders  Plan admission  Devoria Albe, MD, Concha Pyo, MD 03/21/20 [phone]

## 2020-03-21 NOTE — Progress Notes (Signed)
Patient is a 79 year old male with history of SA node dysfunction status post permanent pacemaker placement, bilateral PE and right lower DVT on Eliquis, hypertension who presented to emergency department with complaints of left foot swelling and redness which started on 9/13.  No history of gout.  On presentation, he was found to be hemodynamically stable.  ESR and CRP were elevated.  Left foot x-ray was suspicious for gout infection, there was some soft tissue swelling. Patient has been started on antibiotic for cellulitis and colchicine for acute gouty attack.  I will continue current management.  PT is evaluation requested. Patient seen and examined the bedside this morning .  Currently he is hemodynamically stable. He has erythematous changes of his left foot and swelling of the left first metatarsophalangeal joint consistent with gout.  Other examination was unremarkable. Patient was seen by physical therapy recommended skilled nursing facility.  But after discussion with the son on ppone today, he wants home therapy and does not want to place his father in the skilled nursing facility.  He says he has enough support at home. I have told the son about the need of follow-up with a rheumatologist as an outpatient.  Patient seen by Dr. Josephine Cables this morning and I agree with assessment and plan.  Patient is most likely be able to be discharged in the morning to home .  We will continue current antibiotic regimen for today.  Will change antibiotics to oral tomorrow on discharge.

## 2020-03-21 NOTE — TOC Initial Note (Signed)
Transition of Care [facility]) - Initial/Assessment Note   Patient Details  Name: Luke Crosby MRN: [id number] Date of Birth: May 30, 1941  Transition of Care [facility]) CM/SW Contact:    Ewing Schlein, LCSW Phone Number: 03/21/2020, 3:42 PM  Clinical Narrative: Patient is a 79 year old male who is under observation for gout. Patient has history atrial fibrillation, sinoatrial node dysfunction, essential hypertension, pulmonary embolism, left leg cellulitis, acute deep vein thrombosis, and thrombocytopenia. PT evaluation recommended SNF, but patient's son, Serapio Edelson, prefers patient to discharge home with [facility]. CSW spoke with son and son agreeable to [facility] referral. CSW made referral to [facility] with [facility]. [facility] accepted [facility] for [facility]/OT. Son updated. TOC to follow.  Expected Discharge Plan: Home w Home Health Services Barriers to Discharge: Continued Medical Work up  Patient Goals and CMS Choice Patient states their goals for this hospitalization and ongoing recovery are:: Discharge home with [facility] CMS Medicare.gov Compare Post Acute Care list provided to:: Patient Represenative (must comment) Priscille Heidelberg (son)) Choice offered to / list presented to : Adult Children  Expected Discharge Plan and Services Expected Discharge Plan: Home w Home Health Services In-house Referral: Clinical Social Work Discharge Planning Services: NA Post Acute Care Choice: Home Health Living arrangements for the past 2 months: Single Family Home HH Arranged: PT, OT HH Agency: [facility]) Date HH Agency Contacted: 03/21/20 Time HH Agency Contacted: 1434 Representative spoke with at [facility] Agency: Tim  Prior Living Arrangements/Services Living arrangements for the past 2 months: Single Family Home Lives with:: Self Patient language and need for interpreter reviewed:: Yes Do you feel safe going back to the place where you live?: Yes      Need for Family Participation in Patient  Care: Yes (Comment) Care giver support system in place?: Yes (comment) Criminal Activity/Legal Involvement Pertinent to Current Situation/Hospitalization: No - Comment as needed  Permission Sought/Granted Permission sought to share information with : Facility Medical sales representative, Family Supports Permission granted to share information with : Yes, Verbal Permission Granted Permission granted to share info w AGENCY: HH providers  Emotional Assessment Appearance:: Appears stated age Attitude/Demeanor/Rapport: Engaged Affect (typically observed): Accepting Orientation: : Oriented to Self, Oriented to Place Alcohol / Substance Use: Not Applicable Psych Involvement: No (comment)  Admission diagnosis:  Left leg cellulitis [L03.116] Gout attack [M10.9]   Patient Active Problem List   Diagnosis Date Noted  . Left leg cellulitis 03/21/2020  . Gout 03/21/2020  . Acute deep vein thrombosis (DVT) of femoral vein of right lower extremity (HCC) 03/21/2020  . Thrombocytopenia (HCC) 03/21/2020  . Gout attack 03/21/2020  . Pulmonary embolism (HCC) 03/07/2020  . PE (pulmonary thromboembolism) (HCC) 03/06/2020  . Constipation 03/06/2020  . Essential hypertension 03/06/2020  . Hypokalemia 03/06/2020  . Elevated serum creatinine 03/06/2020  . Elevated BUN 03/06/2020  . Serum total bilirubin elevated 03/06/2020  . Leukocytosis 03/06/2020  . UTI (urinary tract infection) 03/06/2020  . Dehydration 03/06/2020  . TIA (transient ischemic attack) 05/30/2015  . Altered mental status 05/29/2015  . Elevated blood pressure 06/03/2013  . 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 Syncope 04/26/2011  . Atrial fibrillation (HCC) 04/12/2010  . Sinoatrial node dysfunction (HCC) 04/14/2009   PCP:  Gareth Morgan, MD Pharmacy:   [facility] [address], [place] [address] [place] [address] [place] [address] [place] [address] Phone: [phone] Fax: [phone]  Readmission Risk Interventions No  flowsheet data found.

## 2020-03-21 NOTE — Evaluation (Signed)
Physical Therapy Evaluation Patient Details Name: Luke Crosby MRN: [id number] DOB: 1940-09-08 Today's Date: 03/21/2020   History of Present Illness  Luke Crosby is a 79 y.o. male with medical history significant for SA node dysfunction  s/p permanent pacemaker, bilateral pulmonary embolism and right lower extremity DVT on Eliquis and hypertension who presents to the emergency department due to left foot swelling and redness with difficulty to bear weight which started on 9/23.  Patient states that the redness on the dorsum of his foot covered about 3/4 of his left foot, but this has since receded, however, he continued to have pain which has slightly improved from onset of symptoms.  Patient denies any history of gout, and he denies fever, chills, nausea or vomiting.  He decided to come to the ED today due to persistence of symptoms.    Clinical Impression  Patient demonstrates good return for bed mobility, but limited for taking steps due to c/o severe pain left foot with any weightbearing.  Patient unable to ambulate with left heel weightbearing or NWB due to inability to keep LLE off floor due to weakness, limited to a couple of shuffling steps on right foot.  Patient put back to bed.  Patient will benefit from continued physical therapy in hospital and recommended venue below to increase strength, balance, endurance for safe ADLs and gait.    Follow Up Recommendations SNF;Supervision - Intermittent;Supervision for mobility/OOB    Equipment Recommendations  None recommended by PT    Recommendations for Other Services       Precautions / Restrictions Precautions Precautions: Fall Restrictions Weight Bearing Restrictions: No      Mobility  Bed Mobility Overal bed mobility: Modified Independent             General bed mobility comments: increased time, slightly labored movement  Transfers Overall transfer level: Needs assistance Equipment used: Rolling walker (2  wheeled) Transfers: Sit to/from [employer] Sit to Stand: Min assist Stand pivot transfers: Mod assist       General transfer comment: poor tolerance for weightbearing on LLE due to pain  Ambulation/Gait Ambulation/Gait assistance: Mod assist Gait Distance (Feet): 2 Feet Assistive device: Rolling walker (2 wheeled) Gait Pattern/deviations: Decreased step length - right;Decreased step length - left;Decreased stance time - left;Decreased stride length Gait velocity: slow   General Gait Details: limited to 2-3 slow unteady labored steps with poor tolerance for weightbearing on LLE due to increased pain, unable to ambuate with NWB on LLE due to generallized weakness with inability to hold LLE off floor  Stairs            Wheelchair Mobility    Modified Rankin (Stroke Patients Only)       Balance Overall balance assessment: Needs assistance Sitting-balance support: No upper extremity supported;Feet supported Sitting balance-Leahy Scale: Good Sitting balance - Comments: seated at EOB   Standing balance support: Bilateral upper extremity supported;During functional activity Standing balance-Leahy Scale: Poor Standing balance comment:  using RW, unable to hold LLE off floor                             Pertinent Vitals/Pain Pain Assessment: Faces Faces Pain Scale: Hurts whole lot Pain Location: left forefoot when putting pressure on it Pain Descriptors / Indicators: Sore;Sharp;Guarding;Grimacing Pain Intervention(s): Limited activity within patient's tolerance;Monitored during session;Repositioned    Home Living Family/patient expects to be discharged to:: Private residence Living Arrangements: Children Available Help  at Discharge: Family;Available PRN/intermittently Type of Home: House Home Access: Ramped entrance     Home Layout: One level Home Equipment: Walker - 2 wheels;Cane - single point;Bedside commode      Prior Function Level  of Independence: Independent         Comments: Tourist information centre manager, drives     Higher education careers adviser        Extremity/Trunk Assessment   Upper Extremity Assessment Upper Extremity Assessment: Overall WFL for tasks assessed    Lower Extremity Assessment Lower Extremity Assessment: Generalized weakness;RLE deficits/detail;LLE deficits/detail RLE Deficits / Details: grossly 4/5 RLE Sensation: WNL RLE Coordination: WNL LLE Deficits / Details: grossly -3/5 LLE: Unable to fully assess due to pain LLE Sensation: WNL LLE Coordination: WNL    Cervical / Trunk Assessment Cervical / Trunk Assessment: Kyphotic  Communication   Communication: No difficulties  Cognition Arousal/Alertness: Awake/alert Behavior During Therapy: WFL for tasks assessed/performed Overall Cognitive Status: Within Functional Limits for tasks assessed                                        General Comments      Exercises     Assessment/Plan    PT Assessment Patient needs continued PT services  PT Problem List Decreased strength;Decreased activity tolerance;Decreased balance;Decreased mobility       PT Treatment Interventions DME instruction;Gait training;Stair training;Functional mobility training;Therapeutic activities;Therapeutic exercise;Balance training;Patient/family education    PT Goals (Current goals can be found in the Care Plan section)  Acute Rehab PT Goals Patient Stated Goal: return home with family to assist PT Goal Formulation: With patient Time For Goal Achievement: 04/04/20 Potential to Achieve Goals: Good    Frequency Min 3X/week   Barriers to discharge        Co-evaluation               AM-PAC PT "6 Clicks" Mobility  Outcome Measure Help needed turning from your back to your side while in a flat bed without using bedrails?: None Help needed moving from lying on your back to sitting on the side of a flat bed without using bedrails?: None Help needed  moving to and from a bed to a chair (including a wheelchair)?: A Lot Help needed standing up from a chair using your arms (e.g., wheelchair or bedside chair)?: A Lot Help needed to walk in hospital room?: A Lot Help needed climbing 3-5 steps with a railing? : Total 6 Click Score: 15    End of Session   Activity Tolerance: Patient tolerated treatment well;Patient limited by fatigue;Patient limited by pain Patient left: in bed;with call bell/phone within reach Nurse Communication: Mobility status PT Visit Diagnosis: Unsteadiness on feet (R26.81);Other abnormalities of gait and mobility (R26.89);Muscle weakness (generalized) (M62.81)    Time: 1191-4782 PT Time Calculation (min) (ACUTE ONLY): 21 min   Charges:   PT Evaluation $PT Eval Moderate Complexity: 1 Mod PT Treatments $Therapeutic Activity: 8-22 mins       11:36 AM, 03/21/20 Ocie Bob, MPT Physical Therapist with [facility] [phone] office [phone] mobile phone

## 2020-03-21 NOTE — H&P (Signed)
History and Physical  DSEAN VANTOL GGE:[id number] DOB: Jan 05, 1941 DOA: 03/20/2020  Referring physician: Rolland Porter, MD PCP: Lemmie Evens, MD  Patient coming from: Home  Chief Complaint: Left leg pain and swelling  HPI: Luke Crosby is a 79 y.o. male with medical history significant for SA node dysfunction  s/p permanent pacemaker, bilateral pulmonary embolism and right lower extremity DVT on Eliquis and hypertension who presents to the emergency department due to left foot swelling and redness with difficulty to bear weight which started on 9/23.  Patient states that the redness on the dorsum of his foot covered about 3/4 of his left foot, but this has since receded, however, he continued to have pain which has slightly improved from onset of symptoms.  Patient denies any history of gout, and he denies fever, chills, nausea or vomiting.  He decided to come to the ED today due to persistence of symptoms.  ED Course:  In the emergency department, he was hemodynamically stable.  Work-up in the ED showed mild thrombocytopenia, ESR 39, CRP 15.6.  Left foot x-ray was suspicious for gout or infection, soft tissue swelling was noted.  Chest x-ray showed no active cardiopulmonary disease.  Review of Systems: Constitutional: Negative for chills and fever.  HENT: Negative for ear pain and sore throat.   Eyes: Negative for pain and visual disturbance.  Respiratory: Negative for cough, chest tightness and shortness of breath.   Cardiovascular: Negative for chest pain and palpitations.  Gastrointestinal: Negative for abdominal pain and vomiting.  Endocrine: Negative for polyphagia and polyuria.  Genitourinary: Negative for decreased urine volume, dysuria, enuresis Musculoskeletal: Positive for left leg redness with pain and bilateral leg swelling. Skin: Negative for color change and rash.  Allergic/Immunologic: Negative for immunocompromised state.  Neurological: Negative for tremors, syncope,  speech difficulty, weakness, light-headedness and headaches.  Hematological: Does not bruise/bleed easily.  All other systems reviewed and are negative    Past Medical History:  Diagnosis Date  . Altered mental status 05/29/2015  . Hypertension   . Sinus node dysfunction [facility])    Past Surgical History:  Procedure Laterality Date  . INSERT / REPLACE / REMOVE PACEMAKER  ~ 2009   Medtronic Versa VEDro1  . LAPAROSCOPIC CHOLECYSTECTOMY      Social History:  reports that he has never smoked. He has quit using smokeless tobacco.  His smokeless tobacco use included chew. He reports that he does not drink alcohol and does not use drugs.   No Known Allergies  Family History  Problem Relation Age of Onset  . Hypertension Sister     Prior to Admission medications   Medication Sig Start Date End Date Taking? Authorizing Provider  apixaban (ELIQUIS) 5 MG TABS tablet Take Eliquis 10 mg twice a day for 7 days followed by 5 mg twice a day for long-term. 03/09/20   Terrilee Croak, MD  zolpidem (AMBIEN) 10 MG tablet Take 10 mg by mouth at bedtime.     [provider]    Physical Exam: BP (!) 115/104 (BP Location: Right Arm)   Pulse 86   Temp 98.9 F (37.2 C) (Oral)   Resp 18   Ht $R'5\' 4"'VB$  (1.626 m)   Wt 74 kg   SpO2 92%   BMI 28.00 kg/m   . General: 79 y.o. year-old male well developed well nourished in no acute distress.  Alert and oriented x3. Marland Kitchen HEENT: NCAT, EOMI . Neck: Supple, trachea medial . Cardiovascular: Regular rate and Luke with no  rubs or gallops.  No thyromegaly or JVD noted.  No lower extremity edema. 2/4 pulses in all 4 extremities. Marland Kitchen Respiratory: Clear to auscultation with no wheezes or rales. Good inspiratory effort. . Abdomen: Soft nontender nondistended with normal bowel sounds x4 quadrants. . Muskuloskeletal: Bilateral lower extremity swelling, left foot redness, warmth and swelling including the MTP joint of left great toe.  Marland Kitchen Neuro: CN II-XII intact,  strength, sensation, reflexes . Skin: No ulcerative lesions noted or rashes . Psychiatry: Judgement and insight appear normal. Mood is appropriate for condition and setting          Labs on Admission:  Basic Metabolic Panel: Recent Labs  Lab 03/20/20 1926  NA 135  K 3.6  CL 100  CO2 26  GLUCOSE 111*  BUN 19  CREATININE 1.07  CALCIUM 9.3   Liver Function Tests: No results for input(s): AST, ALT, ALKPHOS, BILITOT, PROT, ALBUMIN in the last 168 hours. No results for input(s): LIPASE, AMYLASE in the last 168 hours. No results for input(s): AMMONIA in the last 168 hours. CBC: Recent Labs  Lab 03/20/20 1926  WBC 7.8  HGB 13.4  HCT 43.0  MCV 94.5  PLT 142*   Cardiac Enzymes: No results for input(s): CKTOTAL, CKMB, CKMBINDEX, TROPONINI in the last 168 hours.  BNP (last 3 results) No results for input(s): BNP in the last 8760 hours.  ProBNP (last 3 results) No results for input(s): PROBNP in the last 8760 hours.  CBG: No results for input(s): GLUCAP in the last 168 hours.  Radiological Exams on Admission: DG Chest 2 View  Result Date: 03/20/2020 CLINICAL DATA:  Leg swelling. EXAM: CHEST - 2 VIEW COMPARISON:  May 29, 2015. FINDINGS: The heart size and mediastinal contours are within normal limits. Both lungs are clear. No pneumothorax or pleural effusion is noted. Left-sided pacemaker is unchanged in position. The visualized skeletal structures are unremarkable. IMPRESSION: No active cardiopulmonary disease. Electronically Signed   By: Marijo Conception M.D.   On: 03/20/2020 18:51   DG Foot Complete Left  Result Date: 03/21/2020 CLINICAL DATA:  Redness, swelling, and pain in the great toe. EXAM: LEFT FOOT - COMPLETE 3+ VIEW COMPARISON:  None. FINDINGS: Right hallux valgus deformity with degenerative changes in the first metatarsal-phalangeal joint. Focal lucency is suggested in the medial aspect of the metatarsal head. This is likely degenerative cyst. Gout or infection  would be another possibility. No definite cortical disruption. Overlying soft tissue swelling is present. Dorsal soft tissue swelling also present. No soft tissue gas identified. No acute fracture or dislocation. IMPRESSION: Right hallux valgus deformity with degenerative changes in the first metatarsal-phalangeal joint. Focal lucency in the medial aspect of the first metatarsal head is likely degenerative cyst. Gout or infection would be another possibility. Soft tissue swelling. Electronically Signed   By: Lucienne Capers M.D.   On: 03/21/2020 01:06    EKG: I independently viewed the EKG done and my findings are as followed: A. fib with RVR and RBBB  Assessment/Plan Present on Admission: . Left leg cellulitis . Atrial fibrillation ([place]) . Sinoatrial node dysfunction (HCC) . Pulmonary embolism ([place]) . Essential hypertension  Principal Problem:   Gout Active Problems:   Atrial fibrillation (HCC)   Sinoatrial node dysfunction (HCC)   Essential hypertension   Pulmonary embolism (HCC)   Left leg cellulitis   Acute deep vein thrombosis (DVT) of femoral vein of right lower extremity (HCC)   Thrombocytopenia (HCC)  Presumed new onset left acute gout attack  with concomitant cellulitis  Patient complaining of onset of left foot pain with redness and tenderness with difficulty in being able to bear weight.  Mild twitching of left foot was so severe at onset of symptoms, though, this appears to have improved slightly. Redness of left foot has receded slightly since onset Left foot x-ray was suspicious for gout or infection, soft tissue swelling was noted. Patient has no prior history of gout, uric acid level was normal, ESR and CRP were elevated Acute gout diagnosis rule score=7 (<4 and < 8 are equivocal).  Consider orthopedic consult for synovial fluid analysis for crystals Continue colchicine 0.6 mg p.o. twice daily Continue IV ceftriaxone 1 g daily Continue PT eval and treat  Pulmonary  embolism/right lower extremity DVT CT angio of chest done on 9/13 was suggestive of pulmonary embolism Bilateral lower extremity ultrasound done on 9/14 was positive for extensive occlusive DVT extending from right common femoral vein through the imaged right tibial veins Continue Eliquis  SA node dysfunction s/p permanent pacemaker  Stable  A. fib with RVR Patient has a permanent pacemaker Continue Eliquis   Thrombocytopenia possibly reactive Platelets 142, no obvious sign of bleeding Continue to monitor platelets with morning labs  DVT prophylaxis: Eliquis  Code Status: Full code  Family Communication: None at bedside  Disposition Plan:  Patient is from:                        home Anticipated DC to:                   home Anticipated DC date:               2-3 days Anticipated DC barriers:          Patient unstable for discharge at this time due to possible new onset left foot gout with concomitant cellulitis requiring IV antibiotics at this time  Consults called: None  Admission status: Inpatient    Bernadette Hoit MD [facility] Pager [phone]  If 7PM-7AM, please contact night-coverage [email or web address] Password [facility]  03/21/2020, 5:38 AM

## 2020-03-21 NOTE — Care Management CC44 (Signed)
Condition Code 44 Documentation Completed  Patient Details  Name: Luke Crosby MRN: [id number] Date of Birth: January 16, 1941   Condition Code 44 given:  Yes Patient signature on Condition Code 44 notice:  Yes Documentation of 2 MD's agreement:  Yes Code 44 added to claim:  Yes    Ewing Schlein, LCSW 03/21/2020, 3:24 PM

## 2020-03-21 NOTE — ED Notes (Signed)
Pt provided with lunch tray at this time. Tolerating PO intake without distress, will continue to monitor at this time.

## 2020-03-21 NOTE — Progress Notes (Signed)
Pharmacy Antibiotic Note  NAKOTA ELSEN is a 79 y.o. male admitted on 03/20/2020 with cellulitis.  Pharmacy has been consulted for cefazolin dosing.  Plan: Cefazolin 1000 mg IV every 8 hours. Monitor labs, c/s, and patient improvement.  Height: 5\' 4"  (162.6 cm) Weight: 74 kg (163 lb 2.3 oz) IBW/kg (Calculated) : 59.2  Temp (24hrs), Avg:98.7 F (37.1 C), Min:98.5 F (36.9 C), Max:98.9 F (37.2 C)  Recent Labs  Lab 03/20/20 1926 03/21/20 0414  WBC 7.8 6.2  CREATININE 1.07 1.05    Estimated Creatinine Clearance: 52.5 mL/min (by C-G formula based on SCr of 1.05 mg/dL).    No Known Allergies  Antimicrobials this admission: Cefazolin 9/28 >>  CTX 9/28 x 1    Thank you for allowing pharmacy to be a part of this patient's care.  10/28 03/21/2020 1:43 PM

## 2020-03-22 DIAGNOSIS — M79672 Pain in left foot: Secondary | ICD-10-CM

## 2020-03-22 DIAGNOSIS — I10 Essential (primary) hypertension: Secondary | ICD-10-CM | POA: Diagnosis not present

## 2020-03-22 DIAGNOSIS — I4891 Unspecified atrial fibrillation: Secondary | ICD-10-CM | POA: Diagnosis not present

## 2020-03-22 DIAGNOSIS — I82411 Acute embolism and thrombosis of right femoral vein: Secondary | ICD-10-CM | POA: Diagnosis not present

## 2020-03-22 MED ORDER — SODIUM CHLORIDE 0.9 % IV SOLN
INTRAVENOUS | Status: DC | PRN
  Administered 2020-03-22: 250 mL via INTRAVENOUS

## 2020-03-22 MED ORDER — PREDNISONE 20 MG PO TABS
40.0000 mg | ORAL_TABLET | Freq: Every day | ORAL | Status: AC
  Administered 2020-03-22 – 2020-03-24 (×3): 40 mg via ORAL
  Filled 2020-03-22 (×3): qty 2

## 2020-03-22 NOTE — Progress Notes (Signed)
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  ESR and CRP were elevated.  Left x-ray was suspicious for gout.  Patient was started on antibiotics for cellulitis and colchicine for acute gouty arthritis.   Subjective   Patient seen and examined, feels better than yesterday but still has erythema and tenderness in left foot.   Assessment/Plan:     1. Acute left gout attack-patient presenting with acute gouty arthritis of left first MTP joint.  Patient was started on colchicine 0.6 mg p.o. twice daily with some improvement.  Will start prednisone 40 mg daily for next 3 days. 2. ?  Cellulitis of left foot-patient was started on ceftriaxone 1 g IV daily which has been changed to cefazolin 1 g IV every 8 hours.  Will continue with antibiotics and assess the response to prednisone started for acute gout as above. 3. Atrial fibrillation with RVR-patient has permanent pacemaker, continue Eliquis. 4. SA node dysfunction s/p permanent pacemaker-stable 5. Pulmonary embolism/right lower limb DVT-CTA done on 913 20 minutes rest of pulmonary embolism.  Bilateral lower extremity venous duplex on 03/07/2020 showed extensive occlusive DVT extending from right common femoral vein through the right tibial vein.  Continue Eliquis.     COVID-19 Labs  Recent Labs    03/20/20 1926  CRP 15.6*    Lab Results  Component Value Date   SARSCOV2NAA NEGATIVE 03/21/2020   [place] NEGATIVE 03/06/2020     Scheduled medications:   . apixaban  5 mg Oral BID  . colchicine  0.6 mg Oral BID  . predniSONE  40 mg Oral Q breakfast         CBG: No  results for input(s): GLUCAP in the last 168 hours.  SpO2: 92 %    CBC: Recent Labs  Lab 03/20/20 1926 03/21/20 0414  WBC 7.8 6.2  HGB 13.4 12.6*  HCT 43.0 39.5  MCV 94.5 93.6  PLT 142* 126*    Basic Metabolic Panel: Recent Labs  Lab 03/20/20 1926 03/21/20 0414  NA 135 136  K 3.6 3.7  CL 100 102  CO2 26 26  GLUCOSE 111* 107*  BUN 19 20  CREATININE 1.07 1.05  CALCIUM 9.3 9.0     Liver Function Tests: Recent Labs  Lab 03/21/20 0414  AST 16  ALT 15  ALKPHOS 48  BILITOT 2.7*  PROT 6.4*  ALBUMIN 3.0*     Antibiotics: Anti-infectives (From admission, onward)   Start     Dose/Rate Route Frequency Ordered Stop   03/21/20 2200  ceFAZolin (ANCEF) IVPB 1 g/50 mL premix        1 g 100 mL/hr over 30 Minutes Intravenous Every 8 hours 03/21/20 1342     03/21/20 0600  cefTRIAXone (ROCEPHIN) 1 g in sodium chloride 0.9 % 100 mL IVPB  Status:  Discontinued        1 g 200 mL/hr over 30 Minutes Intravenous Every 24 hours 03/21/20 0525 03/21/20 1249       DVT prophylaxis: Eliquis  Code Status: Full code  Family Communication: No family at bedside    Status is: Inpatient  Dispo: The patient is from: Home  Anticipated d/c is to: Home              Anticipated d/c date is: 03/23/2020              Patient currently not medically stable for discharge  Barrier to discharge-acute gout attack       Consultants:    Procedures:     Objective   Vitals:   03/22/20 0139 03/22/20 0526 03/22/20 0935 03/22/20 1454  BP: 97/71 93/64 100/78 109/80  Pulse: 94 87 85 83  Resp: _0 Temp: 98.4 F (36.9 C) 98.3 F (36.8 C) 98.2 F (36.8 C) 98.6 F (37 C)  TempSrc:    Oral  SpO2: 97% 96% 95% 92%  Weight:      Height:        Intake/Output Summary (Last 24 hours) at 03/22/2020 1534 Last data filed at 03/22/2020 1300 Gross per 24 hour  Intake 480 ml  Output --  Net 480 ml    No intake/output data recorded.  Filed Weights   03/20/20  1803  Weight: 74 kg    Physical Examination:    General: Appears in no acute distress  Cardiovascular: S1-S2, regular, no murmur auscultated  Respiratory: Clear to auscultation bilaterally, no wheezing or crackles auscultated  Abdomen: Abdomen is soft, nontender, no organomegaly  Extremities: Erythema noted in the left foot, warm to touch, tender to palpation tenderness at left first MTP joint  Neurologic: Alert, oriented x3, intact insight and judgment    Data Reviewed:   Recent Results (from the past 240 hour(s))  Respiratory Panel by RT PCR (Flu A&B, Covid) - Nasopharyngeal Swab     Status: None   Collection Time: 03/21/20  2:54 AM   Specimen: Nasopharyngeal Swab  Result Value Ref Range Status   SARS Coronavirus 2 by RT PCR NEGATIVE NEGATIVE Final    Comment: (NOTE) SARS-CoV-2 target nucleic acids are NOT DETECTED.  The SARS-CoV-2 RNA is generally detectable in upper respiratoy specimens during the acute phase of infection. The lowest concentration of SARS-CoV-2 viral copies this assay can detect is 131 copies/mL. A negative result does not preclude SARS-Cov-2 infection and should not be used as the sole basis for treatment or other patient management decisions. A negative result may occur with  improper specimen collection/handling, submission of specimen other than nasopharyngeal swab, presence of viral mutation(s) within the areas targeted by this assay, and inadequate number of viral copies (<131 copies/mL). A negative result must be combined with clinical observations, patient history, and epidemiological information. The expected result is Negative.  Fact Sheet for Patients:  PinkCheek.be  Fact Sheet for Healthcare Providers:  GravelBags.it  This test is no t yet approved or cleared by the [place] FDA and  has been authorized for detection and/or diagnosis of SARS-CoV-2 by FDA under an  Emergency Use Authorization (EUA). This EUA will remain  in effect (meaning this test can be used) for the duration of the COVID-19 declaration under Section 564(b)(1) of the Act, 21 U.S.C. section 360bbb-3(b)(1), unless the authorization is terminated or revoked sooner.     Influenza A by PCR NEGATIVE NEGATIVE Final   Influenza B by PCR NEGATIVE NEGATIVE Final    Comment: (NOTE) The Xpert Xpress SARS-CoV-2/FLU/RSV assay is intended as an aid in  the diagnosis of influenza from Nasopharyngeal swab specimens and  should not be used as a sole basis for treatment. Nasal washings and  aspirates are unacceptable for Xpert Xpress SARS-CoV-2/FLU/RSV  testing.  Fact Sheet for Patients: PinkCheek.be  Fact Sheet for Healthcare Providers: GravelBags.it  This test is not yet approved or cleared by the [place] FDA and  has been authorized for detection and/or diagnosis of SARS-CoV-2 by  FDA under an Emergency Use Authorization (EUA). This EUA will remain  in effect (meaning this test can be used) for the duration of the  Covid-19 declaration under Section 564(b)(1) of the Act, 21  U.S.C. section 360bbb-3(b)(1), unless the authorization is  terminated or revoked. Performed at [facility], [address] [place], [place] [address]      Studies:  DG Chest 2 View  Result Date: 03/20/2020 CLINICAL DATA:  Leg swelling. EXAM: CHEST - 2 VIEW COMPARISON:  May 29, 2015. FINDINGS: The heart size and mediastinal contours are within normal limits. Both lungs are clear. No pneumothorax or pleural effusion is noted. Left-sided pacemaker is unchanged in position. The visualized skeletal structures are unremarkable. IMPRESSION: No active cardiopulmonary disease. Electronically Signed   By: Marijo Conception M.D.   On: 03/20/2020 18:51   DG Foot Complete Left  Result Date: 03/21/2020 CLINICAL DATA:  Redness, swelling, and pain in the great  toe. EXAM: LEFT FOOT - COMPLETE 3+ VIEW COMPARISON:  None. FINDINGS: Right hallux valgus deformity with degenerative changes in the first metatarsal-phalangeal joint. Focal lucency is suggested in the medial aspect of the metatarsal head. This is likely degenerative cyst. Gout or infection would be another possibility. No definite cortical disruption. Overlying soft tissue swelling is present. Dorsal soft tissue swelling also present. No soft tissue gas identified. No acute fracture or dislocation. IMPRESSION: Right hallux valgus deformity with degenerative changes in the first metatarsal-phalangeal joint. Focal lucency in the medial aspect of the first metatarsal head is likely degenerative cyst. Gout or infection would be another possibility. Soft tissue swelling. Electronically Signed   By: Lucienne Capers M.D.   On: 03/21/2020 01:06       [place]   [facility] If 7PM-7AM, please contact night-coverage at [email or web address], Office  [phone]   03/22/2020, 3:34 PM  LOS: 1 day

## 2020-03-23 DIAGNOSIS — I2699 Other pulmonary embolism without acute cor pulmonale: Secondary | ICD-10-CM | POA: Diagnosis not present

## 2020-03-23 DIAGNOSIS — L03116 Cellulitis of left lower limb: Secondary | ICD-10-CM | POA: Diagnosis not present

## 2020-03-23 DIAGNOSIS — M10072 Idiopathic gout, left ankle and foot: Secondary | ICD-10-CM | POA: Diagnosis not present

## 2020-03-23 LAB — CBC
HCT: 36.9 % — ABNORMAL LOW (ref 39.0–52.0)
Hemoglobin: 12.2 g/dL — ABNORMAL LOW (ref 13.0–17.0)
MCH: 30.3 pg (ref 26.0–34.0)
MCHC: 33.1 g/dL (ref 30.0–36.0)
MCV: 91.6 fL (ref 80.0–100.0)
Platelets: 152 10*3/uL (ref 150–400)
RBC: 4.03 MIL/uL — ABNORMAL LOW (ref 4.22–5.81)
RDW: 13 % (ref 11.5–15.5)
WBC: 6.6 10*3/uL (ref 4.0–10.5)
nRBC: 0 % (ref 0.0–0.2)

## 2020-03-23 MED ORDER — ZIPRASIDONE MESYLATE 20 MG IM SOLR
10.0000 mg | Freq: Once | INTRAMUSCULAR | Status: DC
  Filled 2020-03-23: qty 20

## 2020-03-23 MED ORDER — FUROSEMIDE 20 MG PO TABS
20.0000 mg | ORAL_TABLET | Freq: Every day | ORAL | Status: DC
  Administered 2020-03-23 – 2020-03-24 (×2): 20 mg via ORAL
  Filled 2020-03-23: qty 1

## 2020-03-23 NOTE — Progress Notes (Signed)
Physical Therapy Treatment Patient Details Name: Luke Crosby MRN: [id number] DOB: Jul 25, 1940 Today's Date: 03/23/2020    History of Present Illness Luke Crosby is a 79 y.o. male with medical history significant for SA node dysfunction  s/p permanent pacemaker, bilateral pulmonary embolism and right lower extremity DVT on Eliquis and hypertension who presents to the emergency department due to left foot swelling and redness with difficulty to bear weight which started on 9/23.  Patient states that the redness on the dorsum of his foot covered about 3/4 of his left foot, but this has since receded, however, he continued to have pain which has slightly improved from onset of symptoms.  Patient denies any history of gout, and he denies fever, chills, nausea or vomiting.  He decided to come to the ED today due to persistence of symptoms.    PT Comments    Patient demonstrates increased endurance/distance for ambulation with improvement for tolerating weightbearing on left foot, no loss of balance and limited secondary to fatigue.  Patient tolerated sitting up in chair after therapy - nursing staff notified.  Patient will benefit from continued physical therapy in hospital and recommended venue below to increase strength, balance, endurance for safe ADLs and gait.    Follow Up Recommendations  SNF;Supervision - Intermittent;Supervision for mobility/OOB     Equipment Recommendations  Rolling walker with 5" wheels    Recommendations for Other Services       Precautions / Restrictions Precautions Precautions: Fall Restrictions Weight Bearing Restrictions: No    Mobility  Bed Mobility Overal bed mobility: Modified Independent             General bed mobility comments: increased time, slightly labored movement  Transfers Overall transfer level: Needs assistance Equipment used: Rolling walker (2 wheeled) Transfers: Sit to/from [employer] Transfers Sit to Stand:  Supervision;Min guard Stand pivot transfers: Supervision;Min guard       General transfer comment: increased tolerance for weightbearing on LLE  Ambulation/Gait Ambulation/Gait assistance: Min guard;Supervision Gait Distance (Feet): 80 Feet Assistive device: Rolling walker (2 wheeled) Gait Pattern/deviations: Decreased step length - right;Decreased step length - left;Decreased stance time - left;Decreased stride length Gait velocity: slow   General Gait Details: demonstrates increased endurance/distance for ambulation with improved tolerance for weightbearing on LLE, no loss of balance, limited secondary to fatigue   Stairs             Wheelchair Mobility    Modified Rankin (Stroke Patients Only)       Balance Overall balance assessment: Needs assistance Sitting-balance support: Feet supported;No upper extremity supported Sitting balance-Leahy Scale: Good Sitting balance - Comments: seated at EOB   Standing balance support: Bilateral upper extremity supported;During functional activity Standing balance-Leahy Scale: Fair Standing balance comment: using RW                            Cognition Arousal/Alertness: Awake/alert Behavior During Therapy: WFL for tasks assessed/performed Overall Cognitive Status: Within Functional Limits for tasks assessed                                 General Comments: possible sundowning in PM      Exercises General Exercises - Lower Extremity Long Arc Quad: Seated;AROM;Strengthening;Both;10 reps Hip Flexion/Marching: Seated;AROM;Strengthening;Both;10 reps Toe Raises: Seated;AROM;Strengthening;Both;10 reps Heel Raises: Seated;AROM;Strengthening;Both;10 reps    General Comments        Pertinent  Vitals/Pain Pain Assessment: Faces Faces Pain Scale: Hurts little more Pain Location: left forefoot when putting pressure on it Pain Descriptors / Indicators: Sore;Sharp;Guarding;Grimacing Pain  Intervention(s): Limited activity within patient's tolerance;Monitored during session;Premedicated before session    Home Living                      Prior Function            PT Goals (current goals can now be found in the care plan section) Acute Rehab PT Goals Patient Stated Goal: return home with family to assist PT Goal Formulation: With patient Time For Goal Achievement: 04/04/20 Potential to Achieve Goals: Good Progress towards PT goals: Progressing toward goals    Frequency    Min 3X/week      PT Plan Current plan remains appropriate    Co-evaluation              AM-PAC PT "6 Clicks" Mobility   Outcome Measure  Help needed turning from your back to your side while in a flat bed without using bedrails?: None Help needed moving from lying on your back to sitting on the side of a flat bed without using bedrails?: None Help needed moving to and from a bed to a chair (including a wheelchair)?: A Little Help needed standing up from a chair using your arms (e.g., wheelchair or bedside chair)?: A Little Help needed to walk in hospital room?: A Little Help needed climbing 3-5 steps with a railing? : A Lot 6 Click Score: 19    End of Session   Activity Tolerance: Patient tolerated treatment well;Patient limited by fatigue Patient left: in chair;with call bell/phone within reach;with chair alarm set Nurse Communication: Mobility status PT Visit Diagnosis: Unsteadiness on feet (R26.81);Other abnormalities of gait and mobility (R26.89);Muscle weakness (generalized) (M62.81)     Time: 6967-8938 PT Time Calculation (min) (ACUTE ONLY): 30 min  Charges:  $Gait Training: 8-22 mins $Therapeutic Exercise: 8-22 mins                     3:55 PM, 03/23/20 Ocie Bob, MPT Physical Therapist with [facility] [phone] office [phone] mobile phone

## 2020-03-23 NOTE — Plan of Care (Signed)

## 2020-03-23 NOTE — Progress Notes (Signed)
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  ESR and CRP were elevated.  Left x-ray was suspicious for gout.  Patient was started on antibiotics for cellulitis and colchicine for acute gouty arthritis.   Subjective   Patient seen and examined, left foot is better after starting prednisone.  Still has swelling and erythema.   Assessment/Plan:     1. Acute left gout attack-patient presenting with acute gouty arthritis of left first MTP joint.  Patient was started on colchicine 0.6 mg p.o. twice daily with some improvement.  Warmth has improved after starting prednisone 40 mg daily.  We will continue prednisone 40 mg daily for 2 more days.   2. ?  Cellulitis of left foot-patient was started on ceftriaxone 1 g IV daily which has been changed to cefazolin 1 g IV every 8 hours.  Will continue with antibiotics, patient still has erythema of left foot.  Hopefully we can switch him to p.o. antibiotics tomorrow.  3. Atrial fibrillation with RVR-patient has permanent pacemaker, continue Eliquis. 4. SA node dysfunction s/p permanent pacemaker-stable 5. Pulmonary embolism/right lower limb DVT-CTA done on 913 20 minutes rest of pulmonary embolism.  Bilateral lower extremity venous duplex on 03/07/2020 showed extensive occlusive DVT extending from right common femoral vein through the right tibial vein.  Continue Eliquis.     COVID-19 Labs  Recent Labs    03/20/20 1926  CRP 15.6*    Lab Results  Component Value Date   SARSCOV2NAA NEGATIVE 03/21/2020   SARSCOV2NAA NEGATIVE 03/06/2020     Scheduled medications:   . apixaban  5 mg Oral  BID  . colchicine  0.6 mg Oral BID  . furosemide  20 mg Oral Daily  . predniSONE  40 mg Oral Q breakfast  . ziprasidone  10 mg Intramuscular Once         CBG: No results for input(s): GLUCAP in the last 168 hours.  SpO2: 99 %    CBC: Recent Labs  Lab 03/20/20 1926 03/21/20 0414 03/23/20 0627  WBC 7.8 6.2 6.6  HGB 13.4 12.6* 12.2*  HCT 43.0 39.5 36.9*  MCV 94.5 93.6 91.6  PLT 142* 126* 152    Basic Metabolic Panel: Recent Labs  Lab 03/20/20 1926 03/21/20 0414  NA 135 136  K 3.6 3.7  CL 100 102  CO2 26 26  GLUCOSE 111* 107*  BUN 19 20  CREATININE 1.07 1.05  CALCIUM 9.3 9.0     Liver Function Tests: Recent Labs  Lab 03/21/20 0414  AST 16  ALT 15  ALKPHOS 48  BILITOT 2.7*  PROT 6.4*  ALBUMIN 3.0*     Antibiotics: Anti-infectives (From admission, onward)   Start     Dose/Rate Route Frequency Ordered Stop   03/21/20 2200  ceFAZolin (ANCEF) IVPB 1 g/50 mL premix        1 g 100 mL/hr over 30 Minutes Intravenous Every 8 hours 03/21/20 1342     03/21/20 0600  cefTRIAXone (ROCEPHIN) 1 g in sodium chloride 0.9 % 100 mL IVPB  Status:  Discontinued        1 g 200 mL/hr over 30 Minutes Intravenous Every 24 hours 03/21/20  0272 03/21/20 1249       DVT prophylaxis: Eliquis  Code Status: Full code  Family Communication: No family at bedside    Status is: Inpatient  Dispo: The patient is from: Home              Anticipated d/c is to: Home              Anticipated d/c date is: 03/24/2020              Patient currently not medically stable for discharge  Barrier to discharge-acute gout attack, cellulitis       Consultants:    Procedures:     Objective   Vitals:   03/22/20 0935 03/22/20 1454 03/23/20 0520 03/23/20 1515  BP: 100/78 109/80 (!) 119/93 117/79  Pulse: 85 83 93 95  Resp: $Remo'18 16 18 20  'gtGAj$ Temp: 98.2 F (36.8 C) 98.6 F (37 C) 98.9 F (37.2 C) (!) 97.4 F (36.3 C)  TempSrc:  Oral Oral Oral  SpO2: 95% 92% 99% 99%  Weight:       Height:        Intake/Output Summary (Last 24 hours) at 03/23/2020 1529 Last data filed at 03/22/2020 1700 Gross per 24 hour  Intake 390 ml  Output --  Net 390 ml    09/28 1901 - 09/30 0700 In: 536 [P.O.:720; I.V.:50] Out: -   Filed Weights   03/20/20 1803  Weight: 74 kg    Physical Examination:   General-appears in no acute distress  Heart-S1-S2, regular, no murmur auscultated  Lungs-clear to auscultation bilaterally, no wheezing or crackles auscultated  Abdomen-soft, nontender, no organomegaly  Extremities-left foot is edematous, erythematous, mildly tender to palpation, very mild warmth noted.  Neuro-alert, oriented x3, no focal deficit noted    Data Reviewed:   Recent Results (from the past 240 hour(s))  Respiratory Panel by RT PCR (Flu A&B, Covid) - Nasopharyngeal Swab     Status: None   Collection Time: 03/21/20  2:54 AM   Specimen: Nasopharyngeal Swab  Result Value Ref Range Status   SARS Coronavirus 2 by RT PCR NEGATIVE NEGATIVE Final    Comment: (NOTE) SARS-CoV-2 target nucleic acids are NOT DETECTED.  The SARS-CoV-2 RNA is generally detectable in upper respiratoy specimens during the acute phase of infection. The lowest concentration of SARS-CoV-2 viral copies this assay can detect is 131 copies/mL. A negative result does not preclude SARS-Cov-2 infection and should not be used as the sole basis for treatment or other patient management decisions. A negative result may occur with  improper specimen collection/handling, submission of specimen other than nasopharyngeal swab, presence of viral mutation(s) within the areas targeted by this assay, and inadequate number of viral copies (<131 copies/mL). A negative result must be combined with clinical observations, patient history, and epidemiological information. The expected result is Negative.  Fact Sheet for Patients:  PinkCheek.be  Fact Sheet for Healthcare  Providers:  GravelBags.it  This test is no t yet approved or cleared by the [place] FDA and  has been authorized for detection and/or diagnosis of SARS-CoV-2 by FDA under an Emergency Use Authorization (EUA). This EUA will remain  in effect (meaning this test can be used) for the duration of the COVID-19 declaration under Section 564(b)(1) of the Act, 21 U.S.C. section 360bbb-3(b)(1), unless the authorization is terminated or revoked sooner.     Influenza A by PCR NEGATIVE NEGATIVE Final   Influenza B by PCR NEGATIVE NEGATIVE Final  Comment: (NOTE) The Xpert Xpress SARS-CoV-2/FLU/RSV assay is intended as an aid in  the diagnosis of influenza from Nasopharyngeal swab specimens and  should not be used as a sole basis for treatment. Nasal washings and  aspirates are unacceptable for Xpert Xpress SARS-CoV-2/FLU/RSV  testing.  Fact Sheet for Patients: PinkCheek.be  Fact Sheet for Healthcare Providers: GravelBags.it  This test is not yet approved or cleared by the [place] FDA and  has been authorized for detection and/or diagnosis of SARS-CoV-2 by  FDA under an Emergency Use Authorization (EUA). This EUA will remain  in effect (meaning this test can be used) for the duration of the  Covid-19 declaration under Section 564(b)(1) of the Act, 21  U.S.C. section 360bbb-3(b)(1), unless the authorization is  terminated or revoked. Performed at [facility], [address] [place], [place] [address]      [facility] If 7PM-7AM, please contact night-coverage at [email or web address], Office  [phone]   03/23/2020, 3:29 PM  LOS: 1 day

## 2020-03-24 DIAGNOSIS — M10072 Idiopathic gout, left ankle and foot: Secondary | ICD-10-CM | POA: Diagnosis not present

## 2020-03-24 DIAGNOSIS — I82411 Acute embolism and thrombosis of right femoral vein: Secondary | ICD-10-CM | POA: Diagnosis not present

## 2020-03-24 DIAGNOSIS — I10 Essential (primary) hypertension: Secondary | ICD-10-CM | POA: Diagnosis not present

## 2020-03-24 MED ORDER — COLCHICINE 0.6 MG PO TABS
0.6000 mg | ORAL_TABLET | Freq: Two times a day (BID) | ORAL | 0 refills | Status: DC
Start: 2020-03-24 — End: 2020-05-20

## 2020-03-24 MED ORDER — APIXABAN 5 MG PO TABS
5.0000 mg | ORAL_TABLET | Freq: Two times a day (BID) | ORAL | 2 refills | Status: DC
Start: 2020-03-24 — End: 2020-05-13

## 2020-03-24 NOTE — TOC Transition Note (Signed)
Transition of Care [facility]) - CM/SW Discharge Note   Patient Details  Name: VERA WISHART MRN: [id number] Date of Birth: [birth year]  [facility]) CM/SW Contact:  Annice Needy, LCSW Phone Number: 03/24/2020, 1:03 PM   Clinical Narrative:    Rebecca Eaton with [facility] advised of patient's discharge and previous referral acceptance for PT/OT. TOC signing off.    Final next level of care: Home w Home Health Services Barriers to Discharge: No Barriers Identified   Patient Goals and CMS Choice Patient states their goals for this hospitalization and ongoing recovery are:: Discharge home with [facility] CMS Medicare.gov Compare Post Acute Care list provided to:: Patient Represenative (must comment) Priscille Heidelberg (son)) Choice offered to / list presented to : Adult Children  Discharge Placement                       Discharge Plan and Services In-house Referral: Clinical Social Work Discharge Planning Services: NA Post Acute Care Choice: Home Health                    HH Arranged: PT, OT HH Agency: [facility] ([facility] Date HH Agency Contacted: 03/21/20 Time HH Agency Contacted: 1434 Representative spoke with at [facility] Agency: Tim  Social Determinants of Health (SDOH) Interventions     Readmission Risk Interventions No flowsheet data found.

## 2020-03-24 NOTE — Progress Notes (Signed)
PT Cancellation Note  Patient Details Name: Luke Crosby MRN: [id number] DOB: 1940/10/26   Cancelled Treatment:    Reason Eval/Treat Not Completed: Other (comment) (Patient stated he was discharging today and wanted to save his energy) Patient stated he was discharging and wanted to save his energy for going home today and did not want to participate with PT. Confirmed with RN that patient would be discharging home today and did not need PT today.  11:27 AM, 03/24/20 Wyman Songster PT, DPT Physical Therapist at [facility]

## 2020-03-24 NOTE — Plan of Care (Signed)

## 2020-03-24 NOTE — Progress Notes (Signed)
Nsg Discharge Note  Admit Date:  03/20/2020 Discharge date: 03/24/2020   IBN STIEF to be D/C'd Home per MD order.  AVS completed.  Copy for chart, and copy for patient signed, and dated. Removed IV-clean, dry, intact. Reviewed d/c paperwork with patient. Answered all questions. Wheeled stable patient and belongings to main entrance where he was picked by his son. Patient/caregiver able to verbalize understanding.  Discharge Medication: Allergies as of 03/24/2020   No Known Allergies     Medication List    TAKE these medications   apixaban 5 MG Tabs tablet Commonly known as: ELIQUIS Take 1 tablet (5 mg total) by mouth 2 (two) times daily.   colchicine 0.6 MG tablet Take 1 tablet (0.6 mg total) by mouth 2 (two) times daily.            Durable Medical Equipment  (From admission, onward)         Start     Ordered   03/24/20 1059  For home use only DME Walker rolling  Once       Question Answer Comment  Walker: With 5 Inch Wheels   Patient needs a walker to treat with the following condition Gout      03/24/20 1058          Discharge Assessment: Vitals:   03/23/20 2100 03/24/20 0507  BP: 113/90 105/90  Pulse: (!) 103 (!) 106  Resp: 18 16  Temp: 98.6 F (37 C) 97.9 F (36.6 C)  SpO2: 96% 96%   Skin clean, dry and intact without evidence of skin break down, no evidence of skin tears noted. IV catheter discontinued intact. Site without signs and symptoms of complications - no redness or edema noted at insertion site, patient denies c/o pain - only slight tenderness at site.  Dressing with slight pressure applied.  D/c Instructions-Education: Discharge instructions given to patient/family with verbalized understanding. D/c education completed with patient/family including follow up instructions, medication list, d/c activities limitations if indicated, with other d/c instructions as indicated by MD - patient able to verbalize understanding, all questions fully  answered. Patient instructed to return to ED, call 911, or call MD for any changes in condition.  Patient escorted via WC, and D/C home via private auto.  Karolee Ohs, RN 03/24/2020 1:38 PM

## 2020-03-24 NOTE — Plan of Care (Signed)
  Problem: Education: Goal: Knowledge of General Education information will improve Description: Including pain rating scale, medication(s)/side effects and non-pharmacologic comfort measures 03/24/2020 1116 by Karolee Ohs, RN Outcome: Adequate for Discharge 03/24/2020 1040 by Karolee Ohs, RN Outcome: Progressing   Problem: Health Behavior/Discharge Planning: Goal: Ability to manage health-related needs will improve 03/24/2020 1116 by Karolee Ohs, RN Outcome: Adequate for Discharge 03/24/2020 1040 by Karolee Ohs, RN Outcome: Progressing   Problem: Clinical Measurements: Goal: Ability to maintain clinical measurements within normal limits will improve 03/24/2020 1116 by Karolee Ohs, RN Outcome: Adequate for Discharge 03/24/2020 1040 by Karolee Ohs, RN Outcome: Progressing Goal: Will remain free from infection 03/24/2020 1116 by Karolee Ohs, RN Outcome: Adequate for Discharge 03/24/2020 1040 by Karolee Ohs, RN Outcome: Progressing Goal: Diagnostic test results will improve Outcome: Adequate for Discharge Goal: Respiratory complications will improve Outcome: Adequate for Discharge Goal: Cardiovascular complication will be avoided Outcome: Adequate for Discharge   Problem: Activity: Goal: Risk for activity intolerance will decrease Outcome: Adequate for Discharge   Problem: Nutrition: Goal: Adequate nutrition will be maintained Outcome: Adequate for Discharge   Problem: Coping: Goal: Level of anxiety will decrease Outcome: Adequate for Discharge   Problem: Elimination: Goal: Will not experience complications related to bowel motility Outcome: Adequate for Discharge Goal: Will not experience complications related to urinary retention Outcome: Adequate for Discharge   Problem: Pain Managment: Goal: General experience of comfort will improve Outcome: Adequate for Discharge   Problem: Safety: Goal: Ability to remain free from injury will  improve Outcome: Adequate for Discharge   Problem: Skin Integrity: Goal: Risk for impaired skin integrity will decrease Outcome: Adequate for Discharge

## 2020-03-24 NOTE — Discharge Summary (Addendum)
Physician Discharge Summary  Luke Crosby DXA:[id number] DOB: October 18, 1940 DOA: 03/20/2020  PCP: Lemmie Evens, MD  Admit date: 03/20/2020 Discharge date: 03/24/2020  Time spent: 50 minutes  Recommendations for Outpatient Follow-up:  1. Follow-up PCP in 2 weeks 2. Patient to go home with home health PT  Discharge Diagnoses:  Principal Problem:   Gout Active Problems: Left foot cellulitis   Atrial fibrillation (HCC)   Sinoatrial node dysfunction (HCC)   Essential hypertension   Pulmonary embolism (HCC)   Left leg cellulitis   Acute deep vein thrombosis (DVT) of femoral vein of right lower extremity (HCC)   Thrombocytopenia (HCC)   Gout attack   Discharge Condition: Stable  Diet recommendation: Heart healthy diet  Filed Weights   03/20/20 1803  Weight: 74 kg    History of present illness:  79 year-old male with history ofSA nodedysfunction status post permanent pacemaker placement, bilateral PE and right lower DVT on Eliquis, hypertension who presented to emergency department with complaints of left foot swelling and redness which started on 03/06/2020.  ESR and CRP were elevated.  Left x-ray was suspicious for gout.  Patient was started on antibiotics for cellulitis and colchicine for acute gouty arthritis.  Hospital Course:   1. Acute left gout attack-significantly improved with prednisone, patient presenting with acute gouty arthritis of left first MTP joint.  Patient was started on colchicine 0.6 mg p.o. twice daily with some improvement.    Improved with prednisone 40 mg daily for 3 days.  Will discharge on colchicine 0.6 mg p.o. twice daily for 5 more days. 2. ?  Cellulitis of left foot-improved, patient was started on ceftriaxone 1 g IV daily which has been changed to cefazolin 1 g IV every 8 hours.    Will discontinue IV antibiotics.   3. Atrial fibrillation with RVR-patient has permanent pacemaker, continue Eliquis. 4. SA node dysfunction s/p permanent  pacemaker-stable 5. Pulmonary embolism/right lower limb DVT-CTA done on 913 20 minutes rest of pulmonary embolism.  Bilateral lower extremity venous duplex on 03/07/2020 showed extensive occlusive DVT extending from right common femoral vein through the right tibial vein.  Continue Eliquis.   Procedures:    Consultations:    Discharge Exam: Vitals:   03/23/20 2100 03/24/20 0507  BP: 113/90 105/90  Pulse: (!) 103 (!) 106  Resp: 18 16  Temp: 98.6 F (37 C) 97.9 F (36.6 C)  SpO2: 96% 96%    General: Appears in no acute distress Cardiovascular: S1-S2, regular Respiratory: Clear to auscultation bilaterally  Discharge Instructions   Discharge Instructions    Diet - low sodium heart healthy   Complete by: As directed    Increase activity slowly   Complete by: As directed      Allergies as of 03/24/2020   No Known Allergies     Medication List    TAKE these medications   apixaban 5 MG Tabs tablet Commonly known as: ELIQUIS Take 1 tablet (5 mg total) by mouth 2 (two) times daily.   colchicine 0.6 MG tablet Take 1 tablet (0.6 mg total) by mouth 2 (two) times daily.            Durable Medical Equipment  (From admission, onward)         Start     Ordered   03/24/20 1059  For home use only DME Walker rolling  Once       Question Answer Comment  Walker: With 5 Inch Wheels   Patient needs a walker to  treat with the following condition Gout      03/24/20 1058         No Known Allergies    The results of significant diagnostics from this hospitalization (including imaging, microbiology, ancillary and laboratory) are listed below for reference.    Significant Diagnostic Studies: DG Chest 2 View  Result Date: 03/20/2020 CLINICAL DATA:  Leg swelling. EXAM: CHEST - 2 VIEW COMPARISON:  May 29, 2015. FINDINGS: The heart size and mediastinal contours are within normal limits. Both lungs are clear. No pneumothorax or pleural effusion is noted. Left-sided  pacemaker is unchanged in position. The visualized skeletal structures are unremarkable. IMPRESSION: No active cardiopulmonary disease. Electronically Signed   By: Marijo Conception M.D.   On: 03/20/2020 18:51   CT Angio Chest PE W and/or Wo Contrast  Result Date: 03/06/2020 CLINICAL DATA:  High probability pulmonary embolism, prior pulmonary embolism, rectal pain EXAM: CT ANGIOGRAPHY CHEST CT ABDOMEN AND PELVIS WITH CONTRAST TECHNIQUE: Multidetector CT imaging of the chest was performed using the standard protocol during bolus administration of intravenous contrast. Multiplanar CT image reconstructions and MIPs were obtained to evaluate the vascular anatomy. Multidetector CT imaging of the abdomen and pelvis was performed using the standard protocol during bolus administration of intravenous contrast. CONTRAST:  12mL OMNIPAQUE IOHEXOL 350 MG/ML SOLN COMPARISON:  02/25/2020 FINDINGS: CTA CHEST FINDINGS Cardiovascular: There are multiple acute pulmonary emboli again identified bilaterally. Since the prior examination, thrombus has decreased in volume, in keeping with dissolution under anticoagulation, though moderate embolic burden remains, particularly within the left lower lobe. The central pulmonary arteries are of normal caliber. Mild right ventricular dilation is unchanged without CT evidence of right heart strain as the right left ventricles are of similar caliber and there is no shift of the intraventricular septum. Pacemaker leads are again seen within the right heart. Mild coronary artery calcification is again noted. No pericardial effusion. Thoracic aorta is ectatic in its descending segment, unchanged measuring 3.6 cm. Mediastinum/Nodes: No pathologic thoracic adenopathy. Esophagus unremarkable. Thyroid unremarkable peer Lungs/Pleura: Lungs are clear. No pleural effusion or pneumothorax. Musculoskeletal: No chest wall abnormality. No acute or significant osseous findings. Review of the MIP images  confirms the above findings. CT ABDOMEN and PELVIS FINDINGS Hepatobiliary: Cholecystectomy has been performed. Mild intra and extrahepatic biliary ductal dilation is unchanged, likely representing post cholecystectomy change. Rim calcified 15 mm right subhepatic soft tissue nodules is unchanged, possibly representing postsurgical change related to cholecystectomy. The liver is otherwise unremarkable. Pancreas: Unremarkable Spleen: Unremarkable Adrenals/Urinary Tract: The adrenal glands are unremarkable. Multiple simple cortical cysts are seen bilaterally. A 5 mm nonobstructing calculus is again seen within the lower pole of the left kidney. The kidneys are otherwise unremarkable. The bladder is circumferentially thick walled and there is mild perivesicular inflammatory stranding, unchanged from prior examination. Small bladder diverticulum again identified. Together, the findings suggest changes of at least mild bladder outlet obstruction, though the bladder is not distended. Stomach/Bowel: Stomach, small bowel, are unremarkable. There is large stool again seen within the rectal vault which appears progressive since prior examination raising the question of fecal impaction. There is severe sigmoid diverticulosis without superimposed inflammatory change. Appendix normal. No free intraperitoneal gas or fluid. Vascular/Lymphatic: Mild aortoiliac atherosclerotic calcification without evidence of aneurysm. No pathologic adenopathy within the abdomen and pelvis. Reproductive: The prostate gland is mildly enlarged. Other: None significant Musculoskeletal: Advanced degenerative changes are noted within the lumbar spine. No acute bone abnormality. Review of the MIP images confirms the above findings.  IMPRESSION: 1. Multiple acute pulmonary emboli again identified bilaterally. Since the prior examination, thrombus has decreased in volume, in keeping with dissolution under anticoagulation, though moderate embolic burden  remains, particularly within the left lower lobe. No CT evidence of right heart strain. 2. Large stool within the rectal vault which appears progressive since prior examination, raising the question of fecal impaction. 3. Circumferentially thick walled bladder with mild perivesicular inflammatory stranding, unchanged from prior examination. Together, the findings suggest changes of at least mild bladder outlet obstruction, though the bladder is not distended. 4. Nonobstructing left renal calculus. 5. Severe sigmoid diverticulosis without superimposed inflammatory change. Aortic Atherosclerosis (ICD10-I70.0). Electronically Signed   By: Fidela Salisbury MD   On: 03/06/2020 19:00   CT Angio Chest PE W/Cm &/Or Wo Cm  Result Date: 02/25/2020 CLINICAL DATA:  Pulmonary embolism EXAM: CT ANGIOGRAPHY CHEST WITH CONTRAST TECHNIQUE: Multidetector CT imaging of the chest was performed using the standard protocol during bolus administration of intravenous contrast. Multiplanar CT image reconstructions and MIPs were obtained to evaluate the vascular anatomy. CONTRAST:  67mL OMNIPAQUE IOHEXOL 350 MG/ML SOLN COMPARISON:  None. FINDINGS: Cardiovascular: There is excellent opacification of the pulmonary arterial tree. A saddle embolus is seen with thrombus extending into the segmental pulmonary arteries of the upper lobes and the lower lobes bilaterally. The central pulmonary arteries are enlarged in keeping with changes of pulmonary arterial hypertension. The right and left ventricles are equal in caliber and, technically, there is no CT evidence of right heart strain. There is no shift of the interventricular septum. Pacemaker leads are seen within the right atrium and right ventricle. Global cardiac size is within normal limits. No pericardial effusion. Mild coronary artery calcification. The thoracic aorta is ectatic in its descending segment measuring 3.6 cm with mild atherosclerotic calcification. Mediastinum/Nodes: No  enlarged mediastinal, hilar, or axillary lymph nodes. Thyroid gland, trachea, and esophagus demonstrate no significant findings. Lungs/Pleura: Lungs are clear. No pleural effusion or pneumothorax. Upper Abdomen: Cholecystectomy has been performed. Musculoskeletal: The osseous structures are age-appropriate. No acute bone abnormality. Review of the MIP images confirms the above findings. IMPRESSION: 1. Saddle embolus with thrombus extending into the segmental pulmonary arteries of the upper lobes and lower lobes bilaterally. No CT evidence of right heart strain. Echocardiography, however, may be more accurate for detection of elevated right heart pressure and abnormal cardiac motility. 2. Enlarged central pulmonary arteries in keeping with changes of pulmonary arterial hypertension. Aortic Atherosclerosis (ICD10-I70.0). Electronically Signed   By: Fidela Salisbury MD   On: 02/25/2020 20:22   CT ABDOMEN PELVIS W CONTRAST  Result Date: 03/06/2020 CLINICAL DATA:  High probability pulmonary embolism, prior pulmonary embolism, rectal pain EXAM: CT ANGIOGRAPHY CHEST CT ABDOMEN AND PELVIS WITH CONTRAST TECHNIQUE: Multidetector CT imaging of the chest was performed using the standard protocol during bolus administration of intravenous contrast. Multiplanar CT image reconstructions and MIPs were obtained to evaluate the vascular anatomy. Multidetector CT imaging of the abdomen and pelvis was performed using the standard protocol during bolus administration of intravenous contrast. CONTRAST:  180mL OMNIPAQUE IOHEXOL 350 MG/ML SOLN COMPARISON:  02/25/2020 FINDINGS: CTA CHEST FINDINGS Cardiovascular: There are multiple acute pulmonary emboli again identified bilaterally. Since the prior examination, thrombus has decreased in volume, in keeping with dissolution under anticoagulation, though moderate embolic burden remains, particularly within the left lower lobe. The central pulmonary arteries are of normal caliber. Mild right  ventricular dilation is unchanged without CT evidence of right heart strain as the right left ventricles are of  similar caliber and there is no shift of the intraventricular septum. Pacemaker leads are again seen within the right heart. Mild coronary artery calcification is again noted. No pericardial effusion. Thoracic aorta is ectatic in its descending segment, unchanged measuring 3.6 cm. Mediastinum/Nodes: No pathologic thoracic adenopathy. Esophagus unremarkable. Thyroid unremarkable peer Lungs/Pleura: Lungs are clear. No pleural effusion or pneumothorax. Musculoskeletal: No chest wall abnormality. No acute or significant osseous findings. Review of the MIP images confirms the above findings. CT ABDOMEN and PELVIS FINDINGS Hepatobiliary: Cholecystectomy has been performed. Mild intra and extrahepatic biliary ductal dilation is unchanged, likely representing post cholecystectomy change. Rim calcified 15 mm right subhepatic soft tissue nodules is unchanged, possibly representing postsurgical change related to cholecystectomy. The liver is otherwise unremarkable. Pancreas: Unremarkable Spleen: Unremarkable Adrenals/Urinary Tract: The adrenal glands are unremarkable. Multiple simple cortical cysts are seen bilaterally. A 5 mm nonobstructing calculus is again seen within the lower pole of the left kidney. The kidneys are otherwise unremarkable. The bladder is circumferentially thick walled and there is mild perivesicular inflammatory stranding, unchanged from prior examination. Small bladder diverticulum again identified. Together, the findings suggest changes of at least mild bladder outlet obstruction, though the bladder is not distended. Stomach/Bowel: Stomach, small bowel, are unremarkable. There is large stool again seen within the rectal vault which appears progressive since prior examination raising the question of fecal impaction. There is severe sigmoid diverticulosis without superimposed inflammatory  change. Appendix normal. No free intraperitoneal gas or fluid. Vascular/Lymphatic: Mild aortoiliac atherosclerotic calcification without evidence of aneurysm. No pathologic adenopathy within the abdomen and pelvis. Reproductive: The prostate gland is mildly enlarged. Other: None significant Musculoskeletal: Advanced degenerative changes are noted within the lumbar spine. No acute bone abnormality. Review of the MIP images confirms the above findings. IMPRESSION: 1. Multiple acute pulmonary emboli again identified bilaterally. Since the prior examination, thrombus has decreased in volume, in keeping with dissolution under anticoagulation, though moderate embolic burden remains, particularly within the left lower lobe. No CT evidence of right heart strain. 2. Large stool within the rectal vault which appears progressive since prior examination, raising the question of fecal impaction. 3. Circumferentially thick walled bladder with mild perivesicular inflammatory stranding, unchanged from prior examination. Together, the findings suggest changes of at least mild bladder outlet obstruction, though the bladder is not distended. 4. Nonobstructing left renal calculus. 5. Severe sigmoid diverticulosis without superimposed inflammatory change. Aortic Atherosclerosis (ICD10-I70.0). Electronically Signed   By: Fidela Salisbury MD   On: 03/06/2020 19:00   CT Abdomen Pelvis W Contrast  Result Date: 02/25/2020 CLINICAL DATA:  Generalized abdominal pain EXAM: CT ABDOMEN AND PELVIS WITH CONTRAST TECHNIQUE: Multidetector CT imaging of the abdomen and pelvis was performed using the standard protocol following bolus administration of intravenous contrast. CONTRAST:  1110mL OMNIPAQUE IOHEXOL 300 MG/ML  SOLN COMPARISON:  None. FINDINGS: Lower chest: There are filling defects within the lower lobe pulmonary arteries bilaterally compatible with pulmonary emboli. Lung bases clear. No effusions. Pacer wires noted in the right heart. Heart  is normal size. Hepatobiliary: No focal liver abnormality is seen. Status post cholecystectomy. No biliary dilatation. Pancreas: No focal abnormality or ductal dilatation. Spleen: No focal abnormality.  Normal size. Adrenals/Urinary Tract: Bilateral renal cysts. No hydronephrosis. Adrenal glands unremarkable. Urinary bladder decompressed. There appears to be bladder wall thickening. Small diverticulum off the right posterior bladder wall. Stomach/Bowel: Large stool burden in the rectum. Sigmoid diverticulosis. No active diverticulitis. Stomach and small bowel decompressed. Vascular/Lymphatic: Aortic atherosclerosis. No aneurysm. No adenopathy. Reproductive: Enlarged prostate  Other: No free fluid or free air. Musculoskeletal: No acute bony abnormality. IMPRESSION: Bilateral lower lobe pulmonary emboli. Sigmoid diverticulosis.  No active diverticulitis. Large stool burden in the rectum which could reflect fecal impaction. Prostate enlargement with bladder wall thickening and bladder wall diverticulum. Aortic atherosclerosis. Critical Value/emergent results were called by telephone at the time of interpretation on 02/25/2020 at 7:23 pm to provider Fredia Sorrow , who verbally acknowledged these results. Electronically Signed   By: Rolm Baptise M.D.   On: 02/25/2020 19:26   US Venous Img Lower Bilateral (DVT)  Result Date: 03/07/2020 CLINICAL DATA:  Right lower extremity DVT. EXAM: BILATERAL LOWER EXTREMITY VENOUS DOPPLER ULTRASOUND TECHNIQUE: Gray-scale sonography with graded compression, as well as color Doppler and duplex ultrasound were performed to evaluate the lower extremity deep venous systems from the level of the common femoral vein and including the common femoral, femoral, profunda femoral, popliteal and calf veins including the posterior tibial, peroneal and gastrocnemius veins when visible. The superficial great saphenous vein was also interrogated. Spectral Doppler was utilized to evaluate flow at  rest and with distal augmentation maneuvers in the common femoral, femoral and popliteal veins. COMPARISON:  None. FINDINGS: RIGHT LOWER EXTREMITY There is hypoechoic occlusive expansile thrombus involving the right common femoral vein (image 5), extending to involve the saphenofemoral junction (image 10) as well as the imaged portions of the right deep femoral vein (image 16). There is hypoechoic occlusive thrombus involving the proximal (image 13), mid (image 21) and distal (image 29) aspects of the right femoral vein, extending to involve the right popliteal vein (images 33) as well as the imaged portions of the tibial veins. Other Findings:  None. LEFT LOWER EXTREMITY Common Femoral Vein: No evidence of thrombus. Normal compressibility, respiratory phasicity and response to augmentation. Saphenofemoral Junction: No evidence of thrombus. Normal compressibility and flow on color Doppler imaging. Profunda Femoral Vein: No evidence of thrombus. Normal compressibility and flow on color Doppler imaging. Femoral Vein: No evidence of thrombus. Normal compressibility, respiratory phasicity and response to augmentation. Popliteal Vein: No evidence of thrombus. Normal compressibility, respiratory phasicity and response to augmentation. Calf Veins: No evidence of thrombus. Normal compressibility and flow on color Doppler imaging. Superficial Great Saphenous Vein: No evidence of thrombus. Normal compressibility. Venous Reflux:  None. Other Findings:  None. IMPRESSION: 1. The examination is positive for extensive occlusive DVT extending from the right common femoral vein through the imaged right tibial veins. 2. No evidence of DVT within the left lower extremity. Electronically Signed   By: Sandi Mariscal M.D.   On: 03/07/2020 09:16   DG Foot Complete Left  Result Date: 03/21/2020 CLINICAL DATA:  Redness, swelling, and pain in the great toe. EXAM: LEFT FOOT - COMPLETE 3+ VIEW COMPARISON:  None. FINDINGS: Right hallux valgus  deformity with degenerative changes in the first metatarsal-phalangeal joint. Focal lucency is suggested in the medial aspect of the metatarsal head. This is likely degenerative cyst. Gout or infection would be another possibility. No definite cortical disruption. Overlying soft tissue swelling is present. Dorsal soft tissue swelling also present. No soft tissue gas identified. No acute fracture or dislocation. IMPRESSION: Right hallux valgus deformity with degenerative changes in the first metatarsal-phalangeal joint. Focal lucency in the medial aspect of the first metatarsal head is likely degenerative cyst. Gout or infection would be another possibility. Soft tissue swelling. Electronically Signed   By: Lucienne Capers M.D.   On: 03/21/2020 01:06   ECHOCARDIOGRAM COMPLETE  Result Date: 03/07/2020  ECHOCARDIOGRAM REPORT   Patient Name:   Luke Crosby Date of Exam: 03/07/2020 Medical Rec #:  [id number]        Height:       64.0 in Accession #:    [id number]       Weight:       180.0 lb Date of Birth:  30-May-1941       BSA:          1.871 m Patient Age:    78 years         BP:           102/75 mmHg Patient Gender: M                HR:           75 bpm. [facility] Procedure: 2D Echo, Cardiac Doppler and Color Doppler Indications:    Atrial Fibrillation 427.31 / I48.91  History:        Patient has prior history of Echocardiogram examinations, most                 recent 03/25/2006. Pacemaker, TIA, Arrythmias:Atrial                 Fibrillation; Risk Factors:Hypertension. PE (pulmonary                 thromboembolism), Altered mental status.  Sonographer:    Celesta Gentile RCS Referring Phys: [id number] OLADAPO ADEFESO IMPRESSIONS  1. Left ventricular ejection fraction, by estimation, is 60 to 65%. The left ventricle has normal function. The left ventricle has no regional wall motion abnormalities. Left ventricular diastolic parameters are indeterminate.  2. Right ventricular systolic function is  normal. The right ventricular size is normal. There is normal pulmonary artery systolic pressure.  3. Left atrial size was mildly dilated.  4. Right atrial size was mildly dilated.  5. The mitral valve is normal in structure. No evidence of mitral valve regurgitation. No evidence of mitral stenosis.  6. The aortic valve is tricuspid. Aortic valve regurgitation is mild.  7. Aortic dilatation noted. There is mild dilatation of the ascending aorta, measuring 38 mm. FINDINGS  Left Ventricle: Left ventricular ejection fraction, by estimation, is 60 to 65%. The left ventricle has normal function. The left ventricle has no regional wall motion abnormalities. The left ventricular internal cavity size was normal in size. There is  no left ventricular hypertrophy. Left ventricular diastolic parameters are indeterminate. Right Ventricle: The right ventricular size is normal. No increase in right ventricular wall thickness. Right ventricular systolic function is normal. There is normal pulmonary artery systolic pressure. The tricuspid regurgitant velocity is 2.23 m/s, and  with an assumed right atrial pressure of 3 mmHg, the estimated right ventricular systolic pressure is 22.9 mmHg. Left Atrium: Left atrial size was mildly dilated. Right Atrium: Right atrial size was mildly dilated. Pericardium: There is no evidence of pericardial effusion. Mitral Valve: The mitral valve is normal in structure. No evidence of mitral valve regurgitation. No evidence of mitral valve stenosis. Tricuspid Valve: The tricuspid valve is normal in structure. Tricuspid valve regurgitation is mild . No evidence of tricuspid stenosis. Aortic Valve: The aortic valve is tricuspid. Aortic valve regurgitation is mild. Aortic regurgitation PHT measures 739 msec. Aortic valve mean gradient measures 3.4 mmHg. Aortic valve peak gradient measures 6.2 mmHg. Aortic valve area, by VTI measures 1.96 cm. Pulmonic Valve: The pulmonic valve was not well visualized.  Pulmonic valve regurgitation is mild.  No evidence of pulmonic stenosis. Aorta: The aortic root is normal in size and structure and aortic dilatation noted. There is mild dilatation of the ascending aorta, measuring 38 mm. Pulmonary Artery: Indeterminant PASP, IVC poorly visualized. Venous: The inferior vena cava was not well visualized. IAS/Shunts: The interatrial septum was not well visualized. Additional Comments: A pacer wire is visualized.  LEFT VENTRICLE PLAX 2D LVIDd:         4.36 cm LVIDs:         2.81 cm LV PW:         0.98 cm LV IVS:        0.92 cm LVOT diam:     2.00 cm LV SV:         46 LV SV Index:   25 LVOT Area:     3.14 cm  RIGHT VENTRICLE RV S prime:     13.00 cm/s TAPSE (M-mode): 1.6 cm LEFT ATRIUM           Index       RIGHT ATRIUM           Index LA diam:      3.30 cm 1.76 cm/m  RA Area:     24.20 cm LA Vol (A2C): 56.9 ml 30.42 ml/m RA Volume:   78.20 ml  41.80 ml/m LA Vol (A4C): 52.6 ml 28.12 ml/m  AORTIC VALVE AV Area (Vmax):    2.00 cm AV Area (Vmean):   1.95 cm AV Area (VTI):     1.96 cm AV Vmax:           124.02 cm/s AV Vmean:          84.783 cm/s AV VTI:            0.236 m AV Peak Grad:      6.2 mmHg AV Mean Grad:      3.4 mmHg LVOT Vmax:         79.10 cm/s LVOT Vmean:        52.533 cm/s LVOT VTI:          0.147 m LVOT/AV VTI ratio: 0.62 AI PHT:            739 msec  AORTA Ao Root diam: 3.70 cm MITRAL VALVE               TRICUSPID VALVE MV Area (PHT): 4.06 cm    TR Peak grad:   19.9 mmHg MV Decel Time: 187 msec    TR Vmax:        223.00 cm/s MV E velocity: 94.40 cm/s                            SHUNTS                            Systemic VTI:  0.15 m                            Systemic Diam: 2.00 cm Carlyle Dolly MD Electronically signed by Carlyle Dolly MD Signature Date/Time: 03/07/2020/11:00:40 AM    Final     Microbiology: Recent Results (from the past 240 hour(s))  Respiratory Panel by RT PCR (Flu A&B, Covid) - Nasopharyngeal Swab     Status: None   Collection Time:  03/21/20  2:54 AM   Specimen: Nasopharyngeal Swab  Result Value Ref Range  Status   SARS Coronavirus 2 by RT PCR NEGATIVE NEGATIVE Final    Comment: (NOTE) SARS-CoV-2 target nucleic acids are NOT DETECTED.  The SARS-CoV-2 RNA is generally detectable in upper respiratoy specimens during the acute phase of infection. The lowest concentration of SARS-CoV-2 viral copies this assay can detect is 131 copies/mL. A negative result does not preclude SARS-Cov-2 infection and should not be used as the sole basis for treatment or other patient management decisions. A negative result may occur with  improper specimen collection/handling, submission of specimen other than nasopharyngeal swab, presence of viral mutation(s) within the areas targeted by this assay, and inadequate number of viral copies (<131 copies/mL). A negative result must be combined with clinical observations, patient history, and epidemiological information. The expected result is Negative.  Fact Sheet for Patients:  PinkCheek.be  Fact Sheet for Healthcare Providers:  GravelBags.it  This test is no t yet approved or cleared by the [place] FDA and  has been authorized for detection and/or diagnosis of SARS-CoV-2 by FDA under an Emergency Use Authorization (EUA). This EUA will remain  in effect (meaning this test can be used) for the duration of the COVID-19 declaration under Section 564(b)(1) of the Act, 21 U.S.C. section 360bbb-3(b)(1), unless the authorization is terminated or revoked sooner.     Influenza A by PCR NEGATIVE NEGATIVE Final   Influenza B by PCR NEGATIVE NEGATIVE Final    Comment: (NOTE) The Xpert Xpress SARS-CoV-2/FLU/RSV assay is intended as an aid in  the diagnosis of influenza from Nasopharyngeal swab specimens and  should not be used as a sole basis for treatment. Nasal washings and  aspirates are unacceptable for Xpert Xpress  SARS-CoV-2/FLU/RSV  testing.  Fact Sheet for Patients: PinkCheek.be  Fact Sheet for Healthcare Providers: GravelBags.it  This test is not yet approved or cleared by the [place] FDA and  has been authorized for detection and/or diagnosis of SARS-CoV-2 by  FDA under an Emergency Use Authorization (EUA). This EUA will remain  in effect (meaning this test can be used) for the duration of the  Covid-19 declaration under Section 564(b)(1) of the Act, 21  U.S.C. section 360bbb-3(b)(1), unless the authorization is  terminated or revoked. Performed at [facility], [address], [place], [place] [address]      Labs: Basic Metabolic Panel: Recent Labs  Lab 03/20/20 1926 03/21/20 0414  NA 135 136  K 3.6 3.7  CL 100 102  CO2 26 26  GLUCOSE 111* 107*  BUN 19 20  CREATININE 1.07 1.05  CALCIUM 9.3 9.0   Liver Function Tests: Recent Labs  Lab 03/21/20 0414  AST 16  ALT 15  ALKPHOS 48  BILITOT 2.7*  PROT 6.4*  ALBUMIN 3.0*    CBC: Recent Labs  Lab 03/20/20 1926 03/21/20 0414 03/23/20 0627  WBC 7.8 6.2 6.6  HGB 13.4 12.6* 12.2*  HCT 43.0 39.5 36.9*  MCV 94.5 93.6 91.6  PLT 142* 126* 152       Signed:  Oswald Hillock MD.  [facility] 03/24/2020, 11:10 AM

## 2020-04-26 ENCOUNTER — Emergency Department (HOSPITAL_COMMUNITY): Payer: Medicare HMO

## 2020-04-26 ENCOUNTER — Encounter (HOSPITAL_COMMUNITY): Payer: Self-pay | Admitting: *Deleted

## 2020-04-26 ENCOUNTER — Inpatient Hospital Stay (HOSPITAL_COMMUNITY): Payer: Medicare HMO

## 2020-04-26 ENCOUNTER — Inpatient Hospital Stay (HOSPITAL_COMMUNITY)
Admission: EM | Admit: 2020-04-26 | Discharge: 2020-05-13 | DRG: 086 | Disposition: A | Discharge status: Home Health | Payer: Medicare HMO | Attending: Neurosurgery | Admitting: Neurosurgery

## 2020-04-26 ENCOUNTER — Other Ambulatory Visit: Payer: Self-pay

## 2020-04-26 DIAGNOSIS — G9340 Encephalopathy, unspecified: Secondary | ICD-10-CM | POA: Diagnosis present

## 2020-04-26 DIAGNOSIS — I351 Nonrheumatic aortic (valve) insufficiency: Secondary | ICD-10-CM

## 2020-04-26 DIAGNOSIS — Z950 Presence of cardiac pacemaker: Secondary | ICD-10-CM

## 2020-04-26 DIAGNOSIS — R29701 NIHSS score 1: Secondary | ICD-10-CM | POA: Diagnosis present

## 2020-04-26 DIAGNOSIS — R4182 Altered mental status, unspecified: Secondary | ICD-10-CM

## 2020-04-26 DIAGNOSIS — Y92009 Unspecified place in unspecified non-institutional (private) residence as the place of occurrence of the external cause: Secondary | ICD-10-CM | POA: Diagnosis not present

## 2020-04-26 DIAGNOSIS — Z6828 Body mass index (BMI) 28.0-28.9, adult: Secondary | ICD-10-CM | POA: Diagnosis not present

## 2020-04-26 DIAGNOSIS — S06360A Traumatic hemorrhage of cerebrum, unspecified, without loss of consciousness, initial encounter: Secondary | ICD-10-CM

## 2020-04-26 DIAGNOSIS — Z86718 Personal history of other venous thrombosis and embolism: Secondary | ICD-10-CM | POA: Diagnosis not present

## 2020-04-26 DIAGNOSIS — E663 Overweight: Secondary | ICD-10-CM | POA: Diagnosis present

## 2020-04-26 DIAGNOSIS — R1310 Dysphagia, unspecified: Secondary | ICD-10-CM | POA: Diagnosis present

## 2020-04-26 DIAGNOSIS — I6389 Other cerebral infarction: Secondary | ICD-10-CM | POA: Diagnosis not present

## 2020-04-26 DIAGNOSIS — W1830XA Fall on same level, unspecified, initial encounter: Secondary | ICD-10-CM | POA: Diagnosis present

## 2020-04-26 DIAGNOSIS — Z20822 Contact with and (suspected) exposure to covid-19: Secondary | ICD-10-CM | POA: Diagnosis present

## 2020-04-26 DIAGNOSIS — S06350A Traumatic hemorrhage of left cerebrum without loss of consciousness, initial encounter: Principal | ICD-10-CM | POA: Diagnosis present

## 2020-04-26 DIAGNOSIS — E785 Hyperlipidemia, unspecified: Secondary | ICD-10-CM | POA: Diagnosis present

## 2020-04-26 DIAGNOSIS — I619 Nontraumatic intracerebral hemorrhage, unspecified: Secondary | ICD-10-CM | POA: Diagnosis present

## 2020-04-26 DIAGNOSIS — I480 Paroxysmal atrial fibrillation: Secondary | ICD-10-CM

## 2020-04-26 DIAGNOSIS — I2699 Other pulmonary embolism without acute cor pulmonale: Secondary | ICD-10-CM | POA: Diagnosis not present

## 2020-04-26 DIAGNOSIS — R4701 Aphasia: Secondary | ICD-10-CM | POA: Diagnosis present

## 2020-04-26 DIAGNOSIS — Z7901 Long term (current) use of anticoagulants: Secondary | ICD-10-CM

## 2020-04-26 DIAGNOSIS — E871 Hypo-osmolality and hyponatremia: Secondary | ICD-10-CM | POA: Diagnosis not present

## 2020-04-26 DIAGNOSIS — R739 Hyperglycemia, unspecified: Secondary | ICD-10-CM | POA: Diagnosis not present

## 2020-04-26 DIAGNOSIS — I361 Nonrheumatic tricuspid (valve) insufficiency: Secondary | ICD-10-CM

## 2020-04-26 DIAGNOSIS — I611 Nontraumatic intracerebral hemorrhage in hemisphere, cortical: Secondary | ICD-10-CM | POA: Diagnosis not present

## 2020-04-26 DIAGNOSIS — I82409 Acute embolism and thrombosis of unspecified deep veins of unspecified lower extremity: Secondary | ICD-10-CM

## 2020-04-26 DIAGNOSIS — R401 Stupor: Secondary | ICD-10-CM | POA: Diagnosis not present

## 2020-04-26 DIAGNOSIS — M109 Gout, unspecified: Secondary | ICD-10-CM | POA: Diagnosis present

## 2020-04-26 DIAGNOSIS — Z86711 Personal history of pulmonary embolism: Secondary | ICD-10-CM

## 2020-04-26 DIAGNOSIS — I10 Essential (primary) hypertension: Secondary | ICD-10-CM | POA: Diagnosis present

## 2020-04-26 DIAGNOSIS — S06320A Contusion and laceration of left cerebrum without loss of consciousness, initial encounter: Secondary | ICD-10-CM

## 2020-04-26 DIAGNOSIS — I482 Chronic atrial fibrillation, unspecified: Secondary | ICD-10-CM | POA: Diagnosis not present

## 2020-04-26 DIAGNOSIS — I4891 Unspecified atrial fibrillation: Secondary | ICD-10-CM | POA: Diagnosis not present

## 2020-04-26 DIAGNOSIS — S0633AA Contusion and laceration of cerebrum, unspecified, with loss of consciousness status unknown, initial encounter: Secondary | ICD-10-CM

## 2020-04-26 HISTORY — DX: Other pulmonary embolism without acute cor pulmonale: I26.99

## 2020-04-26 HISTORY — DX: Deep phlebothrombosis in pregnancy, unspecified trimester: O22.30

## 2020-04-26 LAB — LIPID PANEL
Cholesterol: 125 mg/dL (ref 0–200)
HDL: 30 mg/dL — ABNORMAL LOW (ref >40)
LDL Cholesterol: 85 mg/dL (ref 0–99)
Total CHOL/HDL Ratio: 4.2 RATIO
Triglycerides: 49 mg/dL (ref <150)
VLDL: 10 mg/dL (ref 0–40)

## 2020-04-26 LAB — COMPREHENSIVE METABOLIC PANEL
ALT: 20 U/L (ref 0–44)
AST: 26 U/L (ref 15–41)
Albumin: 3.6 g/dL (ref 3.5–5.0)
Alkaline Phosphatase: 66 U/L (ref 38–126)
Anion gap: 8 (ref 5–15)
BUN: 13 mg/dL (ref 8–23)
CO2: 29 mmol/L (ref 22–32)
Calcium: 9 mg/dL (ref 8.9–10.3)
Chloride: 100 mmol/L (ref 98–111)
Creatinine, Ser: 1.09 mg/dL (ref 0.61–1.24)
GFR, Estimated: >60 mL/min (ref >60)
Glucose, Bld: 109 mg/dL — ABNORMAL HIGH (ref 70–99)
Potassium: 3.5 mmol/L (ref 3.5–5.1)
Sodium: 137 mmol/L (ref 135–145)
Total Bilirubin: 1.8 mg/dL — ABNORMAL HIGH (ref 0.3–1.2)
Total Protein: 6.6 g/dL (ref 6.5–8.1)

## 2020-04-26 LAB — URINALYSIS, ROUTINE W REFLEX MICROSCOPIC
Bacteria, UA: NONE SEEN
Bilirubin Urine: NEGATIVE
Glucose, UA: NEGATIVE mg/dL
Ketones, ur: 5 mg/dL — AB
Leukocytes,Ua: NEGATIVE
Nitrite: NEGATIVE
Protein, ur: 30 mg/dL — AB
Specific Gravity, Urine: 1.012 (ref 1.005–1.030)
pH: 5 (ref 5.0–8.0)

## 2020-04-26 LAB — BLOOD GAS, VENOUS
Acid-Base Excess: 5.1 mmol/L — ABNORMAL HIGH (ref 0.0–2.0)
Bicarbonate: 26.8 mmol/L (ref 20.0–28.0)
FIO2: 21
O2 Saturation: 43.8 %
Patient temperature: 37
pCO2, Ven: 54.2 mmHg (ref 44.0–60.0)
pH, Ven: 7.365 (ref 7.250–7.430)
pO2, Ven: <31 mmHg — CL (ref 32.0–45.0)

## 2020-04-26 LAB — CBC WITH DIFFERENTIAL/PLATELET
Abs Immature Granulocytes: 0.03 10*3/uL (ref 0.00–0.07)
Basophils Absolute: 0 10*3/uL (ref 0.0–0.1)
Basophils Relative: 1 %
Eosinophils Absolute: 0 10*3/uL (ref 0.0–0.5)
Eosinophils Relative: 1 %
HCT: 40.7 % (ref 39.0–52.0)
Hemoglobin: 12.9 g/dL — ABNORMAL LOW (ref 13.0–17.0)
Immature Granulocytes: 1 %
Lymphocytes Relative: 7 %
Lymphs Abs: 0.4 10*3/uL — ABNORMAL LOW (ref 0.7–4.0)
MCH: 29.3 pg (ref 26.0–34.0)
MCHC: 31.7 g/dL (ref 30.0–36.0)
MCV: 92.5 fL (ref 80.0–100.0)
Monocytes Absolute: 0.5 10*3/uL (ref 0.1–1.0)
Monocytes Relative: 8 %
Neutro Abs: 4.9 10*3/uL (ref 1.7–7.7)
Neutrophils Relative %: 82 %
Platelets: 167 10*3/uL (ref 150–400)
RBC: 4.4 MIL/uL (ref 4.22–5.81)
RDW: 13.9 % (ref 11.5–15.5)
WBC: 5.9 10*3/uL (ref 4.0–10.5)
nRBC: 0 % (ref 0.0–0.2)

## 2020-04-26 LAB — ECHOCARDIOGRAM COMPLETE BUBBLE STUDY
Area-P 1/2: 4.65 cm2
Calc EF: 46.2 %
P 1/2 time: 386 msec
S' Lateral: 3.76 cm
Single Plane A2C EF: 52.3 %
Single Plane A4C EF: 41.3 %

## 2020-04-26 LAB — HEMOGLOBIN A1C
Hgb A1c MFr Bld: 5.8 % — ABNORMAL HIGH (ref 4.8–5.6)
Mean Plasma Glucose: 119.76 mg/dL

## 2020-04-26 LAB — TROPONIN I (HIGH SENSITIVITY)
Troponin I (High Sensitivity): 14 ng/L (ref <18)
Troponin I (High Sensitivity): 12 ng/L (ref <18)

## 2020-04-26 LAB — PROTIME-INR
INR: 1.6 — ABNORMAL HIGH (ref 0.8–1.2)
INR: 1.3 — ABNORMAL HIGH (ref 0.8–1.2)
INR: 1.4 — ABNORMAL HIGH (ref 0.8–1.2)
Prothrombin Time: 18.6 seconds — ABNORMAL HIGH (ref 11.4–15.2)
Prothrombin Time: 16.1 seconds — ABNORMAL HIGH (ref 11.4–15.2)
Prothrombin Time: 16.5 seconds — ABNORMAL HIGH (ref 11.4–15.2)

## 2020-04-26 LAB — LACTIC ACID, PLASMA
Lactic Acid, Venous: 1 mmol/L (ref 0.5–1.9)
Lactic Acid, Venous: 1 mmol/L (ref 0.5–1.9)

## 2020-04-26 LAB — RESPIRATORY PANEL BY RT PCR (FLU A&B, COVID)
Influenza A by PCR: NEGATIVE
Influenza B by PCR: NEGATIVE
SARS Coronavirus 2 by RT PCR: NEGATIVE

## 2020-04-26 LAB — APTT: aPTT: 44 seconds — ABNORMAL HIGH (ref 24–36)

## 2020-04-26 LAB — BRAIN NATRIURETIC PEPTIDE: B Natriuretic Peptide: 120 pg/mL — ABNORMAL HIGH (ref 0.0–100.0)

## 2020-04-26 LAB — MRSA PCR SCREENING: MRSA by PCR: NEGATIVE

## 2020-04-26 MED ORDER — PERFLUTREN LIPID MICROSPHERE
1.0000 mL | INTRAVENOUS | Status: AC | PRN
  Administered 2020-04-26: 2 mL via INTRAVENOUS
  Filled 2020-04-26: qty 10

## 2020-04-26 MED ORDER — SODIUM CHLORIDE 0.9 % IV BOLUS
1000.0000 mL | Freq: Once | INTRAVENOUS | Status: AC
  Administered 2020-04-26: 1000 mL via INTRAVENOUS

## 2020-04-26 MED ORDER — IOHEXOL 350 MG/ML SOLN
100.0000 mL | Freq: Once | INTRAVENOUS | Status: AC | PRN
  Administered 2020-04-26: 75 mL via INTRAVENOUS

## 2020-04-26 MED ORDER — CHLORHEXIDINE GLUCONATE CLOTH 2 % EX PADS
6.0000 | MEDICATED_PAD | Freq: Every day | CUTANEOUS | Status: DC
  Administered 2020-04-26 – 2020-05-13 (×15): 6 via TOPICAL

## 2020-04-26 MED ORDER — ONDANSETRON HCL 4 MG/2ML IJ SOLN: 4.0000 mg | Freq: Four times a day (QID) | INTRAMUSCULAR | Status: DC | PRN

## 2020-04-26 MED ORDER — VITAMIN K1 10 MG/ML IJ SOLN
10.0000 mg | INTRAVENOUS | Status: AC
  Administered 2020-04-26: 10 mg via INTRAVENOUS
  Filled 2020-04-26: qty 1

## 2020-04-26 MED ORDER — SODIUM CHLORIDE 0.9 % IV SOLN
2.0000 g | Freq: Once | INTRAVENOUS | Status: AC
  Administered 2020-04-26: 2 g via INTRAVENOUS
  Filled 2020-04-26: qty 20

## 2020-04-26 MED ORDER — SODIUM CHLORIDE 0.9% FLUSH
9.0000 mL | Freq: Once | INTRAVENOUS | Status: AC
  Administered 2020-04-26: 9 mL via INTRAVENOUS

## 2020-04-26 MED ORDER — LORAZEPAM 2 MG/ML IJ SOLN: 2.0000 mg | Freq: Once | INTRAMUSCULAR | Status: AC

## 2020-04-26 MED ORDER — PROTHROMBIN COMPLEX CONC HUMAN 500 UNITS IV KIT
1491.0000 [IU] | PACK | Status: AC
  Administered 2020-04-26: 1491 [IU] via INTRAVENOUS
  Filled 2020-04-26: qty 1491

## 2020-04-26 MED ORDER — POLYETHYLENE GLYCOL 3350 17 G PO PACK: 17.0000 g | PACK | Freq: Every day | ORAL | Status: DC | PRN

## 2020-04-26 MED ORDER — DILTIAZEM HCL-DEXTROSE 125-5 MG/125ML-% IV SOLN (PREMIX)
5.0000 mg/h | INTRAVENOUS | Status: AC
  Administered 2020-04-26: 5 mg/h via INTRAVENOUS
  Administered 2020-04-27 – 2020-04-29 (×5): 10 mg/h via INTRAVENOUS
  Filled 2020-04-26 (×8): qty 125

## 2020-04-26 MED ORDER — SODIUM CHLORIDE 0.9 % IV SOLN: INTRAVENOUS | Status: DC

## 2020-04-26 MED ORDER — CLEVIDIPINE BUTYRATE 0.5 MG/ML IV EMUL
0.0000 mg/h | INTRAVENOUS | Status: DC
  Administered 2020-04-26: 1 mg/h via INTRAVENOUS
  Filled 2020-04-26: qty 50

## 2020-04-26 MED ORDER — METOPROLOL TARTRATE 5 MG/5ML IV SOLN
5.0000 mg | Freq: Once | INTRAVENOUS | Status: AC
  Administered 2020-04-26: 5 mg via INTRAVENOUS
  Filled 2020-04-26: qty 5

## 2020-04-26 MED ORDER — LORAZEPAM 2 MG/ML IJ SOLN
INTRAMUSCULAR | Status: AC
  Administered 2020-04-26: 2 mg via INTRAVENOUS
  Filled 2020-04-26: qty 1

## 2020-04-26 MED ORDER — DOCUSATE SODIUM 100 MG PO CAPS: 100.0000 mg | ORAL_CAPSULE | Freq: Two times a day (BID) | ORAL | Status: DC | PRN

## 2020-04-26 NOTE — H&P (Addendum)
NAME:  Luke Crosby, MRN:  [id number], DOB:  1941/03/30, LOS: 0 ADMISSION DATE:  04/26/2020, CONSULTATION DATE:  04/26/2020 REFERRING MD:  Dr. Pilar Plate, CHIEF COMPLAINT:  AMS  Brief History   79 year old male w/hx of PE and DVT/ Afib on Eliquis presenting with progressive confusion found to have left frontal intraparenchymal hemorrhage s/p reversal with Kcentra and vitamin K.  [facility].   History of present illness   HPI obtained from son at bedside, as patient is aphasic and unable to provide.   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    Patient lives with his son.  Son, Aneta Mins, reports patient has not been himself since his diagnosis of PE/ DVT in September.  Prior to that, he was active, mowing the yard, and independent.  Recent E. Coli UTI and hospitalization  9/27- 10/1 for gout and cellulitis.  Since, he has had a progressive decline, however son can not given specifics, but has not been eating well with increasing confusion especially over 2 days, and this morning, he stopped speaking.  No reported trauma initially, son later reported fall.    In the ER, he was afebrile, normotensive, normal oxygen saturations, and noted to be in Afib with rates 120-140.  Labs noted for glucose 109, t. Bili 1.8, neg troponin hs, Hgb 12.9, INR 1.6, PT 18.6, UA negative for leukocytes or nitrates.  CXR negative for acute process.  CT head showed 5.8 cm left frontal intraparenchymal hemorrhage with layering blood with 4 mm rightward midline shift with some multifocal left frontal hypersensitives, some of which show internal fluid levels suspicious for septic emboli, along with small volume subdural and subarachnoid hemorrhage.  Neurosurgery consulted in ER who agreed with reversal and ICU  admission.  He was reversed with vitamin K and Kcentra.  He was placed on cleviprex for SBP goal < 160.  While in ER, he remained aphasic, moving all extremities, but not following commands, and protecting his airway.  Patient transferred to [facility] for further specialized neuro care, PCCM asked to admit.   Past Medical History  Never smoker, Sinus dysfunction s/p medtronic pacemaker, bilateral PE ( 02/2020) and RLE DVT on Eliquis,  PAF,  HTN, Recent UTI and gout flare/ cellulitis   Significant Hospital Events   11/3 presented to [facility] -> [facility]  Consults:  Neurology   Procedures:   Significant Diagnostic Tests:  04/26/2020 [facility] >> 5.8 cm left frontal intraparenchymal hemorrhage with 4 mm rightward midline shift. Multifocal left frontal hyperdensities some of which demonstrate internal fluid-fluid levels are suspicious for septic emboli. Consider MRI head with and without contrast for further evaluation.  Small volume subdural and subarachnoid hemorrhage.  Micro Data:  11/3 SARS2/ flu >> neg 11/3 BCx2 >> 11/3 MRSA PCR >>  Antimicrobials:  11/3 ceftriaxone   Interim history/subjective:  Currently off cleviprex  Son at bedside  Objective   Blood pressure (!) 122/98, pulse (!) 113, temperature 99.3 F (37.4 C), temperature source Oral, resp. rate (!) 28, height 5\' 6"  (1.676 m), weight 79.4 kg, SpO2 99 %.        Intake/Output Summary (Last 24 hours) at 04/26/2020 1322 Last data filed at 04/26/2020 1146 Gross per 24 hour  Intake 1208.43 ml  Output --  Net 1208.43 ml  Filed Weights   04/26/20 0648 04/26/20 1016 04/26/20 1300  Weight: 74 kg 83.9 kg 79.4 kg   Examination: General:  Elderly male sitting upright in bed in NAD HEENT: MM pink/moist Neuro: Awake, tracks, moves all extremities but will not follow commands CV: IRIR, no obvious murmur, +2 plus distal pulses PULM:  Non labored, clear anteriorly, diminished in bases, on 2L [place] GI: soft, bs hypo active, premofit in place   Extremities: warm/dry, +1-2 LE edema, some erythema in left foot/ MTP area (improved from prior admit and stable since stable per son) Skin: no rashes   Resolved Hospital Problem list    Assessment & Plan:    traumatic, Left frontal intraparenchymal hemorrhage, small volume SDH/ SAH - s/p reversal with Kcentra and vitamin K - with multifocal left frontal hyperdensities concerning for possible septic emboli P: ICU monitoring  SBP goal < 160, cardizem as below to control HR, if more BP is needed, can add prn metoprolol  At risk for aspiration/ intubation Serial neuro checks Neurology consulted for further stroke management and imaging  TTE with bubble study (recent TTE 03/07/2020 with normal EF, no RWA, diastolic parameters indeterminate, normal RV systolic, mild AR with mild aortic dilation.) BC already sent.  He remains afebrile and normal WBC; monitor clinically  Trend coags, CBC, BMET PT/ SLP   Afib with RVR CHA2DS2-=VASc score 3, HAS-BLED score 3  P:  Cardizem gtt for rate control  Hold further anticoagulation given IPH   PE with extensive RLE DVT - diagnosed 03/06/2020 since on Eliquis P:  [place] BLE to assess for residual DVT, if so will need IR consult for IVC   Hx HTN  - does not appear to be on home meds  P:  BP parameters as above  Gout P:  Holding home colchicine  Best practice:  Diet: NPO, will need SLP when he can participate  Pain/Anxiety/Delirium protocol (if indicated): n/a VAP protocol (if indicated): n/a DVT prophylaxis: none given IPH and recent DVT GI prophylaxis: n/a Glucose control: trend on BMET Mobility: BR Code Status: Full  Family Communication: Son, Aneta Mins updated at bedside by Dr. Gaynell Face Disposition: Neuro ICU   Labs   CBC: Recent Labs  Lab 04/26/20 0735  WBC 5.9  NEUTROABS 4.9  HGB 12.9*  HCT 40.7  MCV 92.5  PLT 167    Basic Metabolic Panel: Recent Labs  Lab 04/26/20 0735  NA 137  K 3.5  CL 100  CO2 29  GLUCOSE 109*   BUN 13  CREATININE 1.09  CALCIUM 9.0   GFR: Estimated Creatinine Clearance: 54.4 mL/min (by C-G formula based on SCr of 1.09 mg/dL). Recent Labs  Lab 04/26/20 0735 04/26/20 0911  WBC 5.9  --   LATICACIDVEN 1.0 1.0    Liver Function Tests: Recent Labs  Lab 04/26/20 0735  AST 26  ALT 20  ALKPHOS 66  BILITOT 1.8*  PROT 6.6  ALBUMIN 3.6   No results for input(s): LIPASE, AMYLASE in the last 168 hours. No results for input(s): AMMONIA in the last 168 hours.  ABG    Component Value Date/Time   HCO3 26.8 04/26/2020 0735   O2SAT 43.8 04/26/2020 0735     Coagulation Profile: Recent Labs  Lab 04/26/20 0735  INR 1.6*    Cardiac Enzymes: No results for input(s): CKTOTAL, CKMB, CKMBINDEX, TROPONINI in the last 168 hours.  HbA1C: Hgb A1c MFr Bld  Date/Time Value Ref Range Status  05/30/2015 05:55 AM 6.4 (H) 4.8 - 5.6 %  Final    Comment:    (NOTE)         Pre-diabetes: 5.7 - 6.4         Diabetes: >6.4         Glycemic control for adults with diabetes: <7.0     CBG: No results for input(s): GLUCAP in the last 168 hours.  Review of Systems:   Unable to provide from patient   Past Medical History  He,  has a past medical history of Altered mental status (05/29/2015), Hypertension, and Sinus node dysfunction (HCC).   Surgical History    Past Surgical History:  Procedure Laterality Date   INSERT / REPLACE / REMOVE PACEMAKER  ~ 2009   Medtronic Versa VEDro1   LAPAROSCOPIC CHOLECYSTECTOMY       Social History   reports that he has never smoked. He has quit using smokeless tobacco.  His smokeless tobacco use included chew. He reports that he does not drink alcohol and does not use drugs.   Family History   His family history includes Hypertension in his sister.   Allergies No Known Allergies   Home Medications  Prior to Admission medications   Medication Sig Start Date End Date Taking? Authorizing Provider  amoxicillin (AMOXIL) 500 MG capsule Take 500  mg by mouth 3 (three) times daily. 04/17/20  Yes [provider]  apixaban (ELIQUIS) 5 MG TABS tablet Take 1 tablet (5 mg total) by mouth 2 (two) times daily. 03/24/20  Yes Meredeth Ide, MD  colchicine 0.6 MG tablet Take 1 tablet (0.6 mg total) by mouth 2 (two) times daily. 03/24/20  Yes Meredeth Ide, MD     Critical care time: 45 mins     Posey Boyer, ACNP [facility] 04/26/2020, 3:12 PM  See Amion for personal pager PCCM on call pager [phone]

## 2020-04-26 NOTE — Progress Notes (Signed)
S: The patient continues to exhibit chewing movements. Ativan 2 mg IV to be administered to assess for possible cessation of chewing movements and improved mentation.   O: BP 120/87   Pulse (!) 104   Temp 99.2 F (37.3 C) (Oral)   Resp (!) 33   Ht 5\' 6"  (1.676 m)   Wt 79.4 kg   SpO2 99%   BMI 28.25 kg/m   Exam prior to Ativan administration.  Awake. Nonverbal. Does not respond to any commands. Will track examiner and RN visually. Face symmetric. Continuous chewing movements noted. Withdraws BLE briskly to plantar stimulation.   Exam after 2 mg Ativan administration: Decreased amplitude of chewing movements, but no cessation. Becomes drowsy. No improvement in mentation. Remains nonverbal and not responding to verbal commands or questions.   EEG: ABNORMALITY -Continuous slow, generalized and maximal left frontal region IMPRESSION: This study is suggestive of cortical dysfunction in left frontal region consistent with underlying ICH. There is also moderate diffuse encephalopathy, nonspecific etiology. No seizures or epileptiform discharges were seen throughout the recording.  A/R: 79 year old male with left frontal lobe ICH. -- DDx includes hemorrhagic metastasis and septic embolus with abscess formation and hemorrhage.  -- No improvement of chewing movements with Ativan -- EEG shows no electrographic seizures. Findings c/w left frontal cortical dysfunction.   Electronically signed: Dr. [id number]

## 2020-04-26 NOTE — ED Notes (Signed)
Patient transported to X-ray 

## 2020-04-26 NOTE — Consult Note (Signed)
Reason for Consult:Left frontal ICH, status post fall Referring Physician: [name] is an 79 y.o. male.  HPI: whom fell striking his head. He was brought to the [facility] ED for altered mental status. Head CT showed a left frontal ICU with fluid fluid levels. He is anticoagulated due to a DVT diagnosed in September. By report from the ED he is mildly somnolent, moving all extremities. Paucity of speech. Transferred for care. Has been admitted to the Critical care service at this time. He is currently alert, has received an EEG.   Past Medical History:  Diagnosis Date  . Altered mental status 05/29/2015  . DVT (deep vein thrombosis) in pregnancy   . Hypertension   . Pulmonary embolism (HCC)   . Sinus node dysfunction (HCC)    With pacemaker    Past Surgical History:  Procedure Laterality Date  . INSERT / REPLACE / REMOVE PACEMAKER  ~ 2009   Medtronic Versa VEDro1  . LAPAROSCOPIC CHOLECYSTECTOMY      Family History  Problem Relation Age of Onset  . Hypertension Sister     Social History:  reports that he has never smoked. He has quit using smokeless tobacco.  His smokeless tobacco use included chew. He reports that he does not drink alcohol and does not use drugs.  Allergies: No Known Allergies  Medications: I have reviewed the patient's current medications.  Results for orders placed or performed during the hospital encounter of 04/26/20 (from the past 48 hour(s))  Lactic acid, plasma     Status: None   Collection Time: 04/26/20  7:35 AM  Result Value Ref Range   Lactic Acid, Venous 1.0 0.5 - 1.9 mmol/L    Comment: Performed at [facility], [address], [place], [place] [address]  Comprehensive metabolic panel     Status: Abnormal   Collection Time: 04/26/20  7:35 AM  Result Value Ref Range   Sodium 137 135 - 145 mmol/L   Potassium 3.5 3.5 - 5.1 mmol/L   Chloride 100 98 - 111 mmol/L   CO2 29 22 - 32 mmol/L   Glucose, Bld 109 (H) 70 - 99 mg/dL     Comment: Glucose reference range applies only to samples taken after fasting for at least 8 hours.   BUN 13 8 - 23 mg/dL   Creatinine, Ser 2.29 0.61 - 1.24 mg/dL   Calcium 9.0 8.9 - 79.8 mg/dL   Total Protein 6.6 6.5 - 8.1 g/dL   Albumin 3.6 3.5 - 5.0 g/dL   AST 26 15 - 41 U/L   ALT 20 0 - 44 U/L   Alkaline Phosphatase 66 38 - 126 U/L   Total Bilirubin 1.8 (H) 0.3 - 1.2 mg/dL   GFR, Estimated >92 >11 mL/min    Comment: (NOTE) Calculated using the CKD-EPI Creatinine Equation (2021)    Anion gap 8 5 - 15    Comment: Performed at [facility], [address], [place], [place] [address]  CBC WITH DIFFERENTIAL     Status: Abnormal   Collection Time: 04/26/20  7:35 AM  Result Value Ref Range   WBC 5.9 4.0 - 10.5 K/uL   RBC 4.40 4.22 - 5.81 MIL/uL   Hemoglobin 12.9 (L) 13.0 - 17.0 g/dL   HCT 08.1 39 - 52 %   MCV 92.5 80.0 - 100.0 fL   MCH 29.3 26.0 - 34.0 pg   MCHC 31.7 30.0 - 36.0 g/dL   RDW 44.8 18.5 - 63.1 %  Platelets 167 150 - 400 K/uL   nRBC 0.0 0.0 - 0.2 %   Neutrophils Relative % 82 %   Neutro Abs 4.9 1.7 - 7.7 K/uL   Lymphocytes Relative 7 %   Lymphs Abs 0.4 (L) 0.7 - 4.0 K/uL   Monocytes Relative 8 %   Monocytes Absolute 0.5 0.1 - 1.0 K/uL   Eosinophils Relative 1 %   Eosinophils Absolute 0.0 0.0 - 0.5 K/uL   Basophils Relative 1 %   Basophils Absolute 0.0 0.0 - 0.1 K/uL   Immature Granulocytes 1 %   Abs Immature Granulocytes 0.03 0.00 - 0.07 K/uL    Comment: Performed at [facility], [address], [place], [place] [address]  Protime-INR     Status: Abnormal   Collection Time: 04/26/20  7:35 AM  Result Value Ref Range   Prothrombin Time 18.6 (H) 11.4 - 15.2 seconds   INR 1.6 (H) 0.8 - 1.2    Comment: (NOTE) INR goal varies based on device and disease states. Performed at [facility], [address], [place], [place] [address]   APTT     Status: Abnormal   Collection Time: 04/26/20  7:35 AM  Result Value Ref Range   aPTT 44 (H) 24 - 36 seconds    Comment:         IF BASELINE aPTT IS ELEVATED, SUGGEST PATIENT RISK ASSESSMENT BE USED TO DETERMINE APPROPRIATE ANTICOAGULANT THERAPY. Performed at [facility], [address] [place], [place] [address]   Blood culture (routine single)     Status: None (Preliminary result)   Collection Time: 04/26/20  7:35 AM   Specimen: BLOOD  Result Value Ref Range   Specimen Description BLOOD LEFT ANTECUBITAL    Special Requests      BOTTLES DRAWN AEROBIC AND ANAEROBIC Blood Culture results may not be optimal due to an excessive volume of blood received in culture bottles   Culture      NO GROWTH <12 HOURS Performed at [facility], [address], [place], [place] [address]    Report Status PENDING   Brain natriuretic peptide     Status: Abnormal   Collection Time: 04/26/20  7:35 AM  Result Value Ref Range   B Natriuretic Peptide 120.0 (H) 0.0 - 100.0 pg/mL    Comment: Performed at [facility], [address] [place], [place] [address]  Troponin I (High Sensitivity)     Status: None   Collection Time: 04/26/20  7:35 AM  Result Value Ref Range   Troponin I (High Sensitivity) 14 <18 ng/L    Comment: (NOTE) Elevated high sensitivity troponin I (hsTnI) values and significant  changes across serial measurements may suggest ACS but many other  chronic and acute conditions are known to elevate hsTnI results.  Refer to the "Links" section for chest pain algorithms and additional  guidance. Performed at [facility], [address] [place], [place] [address]   Blood gas, venous     Status: Abnormal   Collection Time: 04/26/20  7:35 AM  Result Value Ref Range   FIO2 21.00    pH, Ven 7.365 7.25 - 7.43   pCO2, Ven 54.2 44 - 60 mmHg   pO2, Ven <31.0 (LL) 32 - 45 mmHg    Comment: CRITICAL RESULT CALLED TO, READ BACK BY AND VERIFIED WITH: CARDWELL,L AT 8:30AM ON 04/26/20 BY FESTERMAN,C    Bicarbonate 26.8 20.0 - 28.0 mmol/L   Acid-Base Excess 5.1 (H) 0.0 - 2.0 mmol/L   O2 Saturation  43.8 %   Patient  temperature 37.0     Comment: Performed at [facility], [address], [place], [place] [address]  Respiratory Panel by RT PCR (Flu A&B, Covid) - Nasopharyngeal Swab     Status: None   Collection Time: 04/26/20  8:22 AM   Specimen: Nasopharyngeal Swab  Result Value Ref Range   SARS Coronavirus 2 by RT PCR NEGATIVE NEGATIVE    Comment: (NOTE) SARS-CoV-2 target nucleic acids are NOT DETECTED.  The SARS-CoV-2 RNA is generally detectable in upper respiratoy specimens during the acute phase of infection. The lowest concentration of SARS-CoV-2 viral copies this assay can detect is 131 copies/mL. A negative result does not preclude SARS-Cov-2 infection and should not be used as the sole basis for treatment or other patient management decisions. A negative result may occur with  improper specimen collection/handling, submission of specimen other than nasopharyngeal swab, presence of viral mutation(s) within the areas targeted by this assay, and inadequate number of viral copies (<131 copies/mL). A negative result must be combined with clinical observations, patient history, and epidemiological information. The expected result is Negative.  Fact Sheet for Patients:  https://www.moore.com/  Fact Sheet for Healthcare Providers:  https://www.young.biz/  This test is no t yet approved or cleared by the [place] FDA and  has been authorized for detection and/or diagnosis of SARS-CoV-2 by FDA under an Emergency Use Authorization (EUA). This EUA will remain  in effect (meaning this test can be used) for the duration of the COVID-19 declaration under Section 564(b)(1) of the Act, 21 U.S.C. section 360bbb-3(b)(1), unless the authorization is terminated or revoked sooner.     Influenza A by PCR NEGATIVE NEGATIVE   Influenza B by PCR NEGATIVE NEGATIVE    Comment: (NOTE) The Xpert Xpress SARS-CoV-2/FLU/RSV assay is intended as an aid in  the diagnosis  of influenza from Nasopharyngeal swab specimens and  should not be used as a sole basis for treatment. Nasal washings and  aspirates are unacceptable for Xpert Xpress SARS-CoV-2/FLU/RSV  testing.  Fact Sheet for Patients: https://www.moore.com/  Fact Sheet for Healthcare Providers: https://www.young.biz/  This test is not yet approved or cleared by the [place] FDA and  has been authorized for detection and/or diagnosis of SARS-CoV-2 by  FDA under an Emergency Use Authorization (EUA). This EUA will remain  in effect (meaning this test can be used) for the duration of the  Covid-19 declaration under Section 564(b)(1) of the Act, 21  U.S.C. section 360bbb-3(b)(1), unless the authorization is  terminated or revoked. Performed at [facility], [address], [place], [place] [address]   Urinalysis, Routine w reflex microscopic Urine, Catheterized     Status: Abnormal   Collection Time: 04/26/20  8:55 AM  Result Value Ref Range   Color, Urine YELLOW YELLOW   APPearance CLEAR CLEAR   Specific Gravity, Urine 1.012 1.005 - 1.030   pH 5.0 5.0 - 8.0   Glucose, UA NEGATIVE NEGATIVE mg/dL   Hgb urine dipstick SMALL (A) NEGATIVE   Bilirubin Urine NEGATIVE NEGATIVE   Ketones, ur 5 (A) NEGATIVE mg/dL   Protein, ur 30 (A) NEGATIVE mg/dL   Nitrite NEGATIVE NEGATIVE   Leukocytes,Ua NEGATIVE NEGATIVE   RBC / HPF 0-5 0 - 5 RBC/hpf   WBC, UA 0-5 0 - 5 WBC/hpf   Bacteria, UA NONE SEEN NONE SEEN   Mucus PRESENT     Comment: Performed at [facility], [address], [place], [place] [address]  Lactic acid, plasma  Status: None   Collection Time: 04/26/20  9:11 AM  Result Value Ref Range   Lactic Acid, Venous 1.0 0.5 - 1.9 mmol/L    Comment: Performed at [facility], [address], [place], [place] [address]  Troponin I (High Sensitivity)     Status: None   Collection Time: 04/26/20  9:11 AM  Result Value Ref Range   Troponin I (High Sensitivity)  12 <18 ng/L    Comment: (NOTE) Elevated high sensitivity troponin I (hsTnI) values and significant  changes across serial measurements may suggest ACS but many other  chronic and acute conditions are known to elevate hsTnI results.  Refer to the "Links" section for chest pain algorithms and additional  guidance. Performed at [facility], [address], [place], [place] [address]   Protime-INR     Status: Abnormal   Collection Time: 04/26/20 11:33 AM  Result Value Ref Range   Prothrombin Time 16.1 (H) 11.4 - 15.2 seconds   INR 1.3 (H) 0.8 - 1.2    Comment: (NOTE) INR goal varies based on device and disease states. Performed at [facility], [address] [place], [place] [address]   MRSA PCR Screening     Status: None   Collection Time: 04/26/20 12:57 PM   Specimen: Nasal Mucosa; Nasopharyngeal  Result Value Ref Range   MRSA by PCR NEGATIVE NEGATIVE    Comment:        The GeneXpert MRSA Assay (FDA approved for NASAL specimens only), is one component of a comprehensive MRSA colonization surveillance program. It is not intended to diagnose MRSA infection nor to guide or monitor treatment for MRSA infections. Performed at [facility], [address], [place], [place] [address]   Lipid panel     Status: Abnormal   Collection Time: 04/26/20  3:30 PM  Result Value Ref Range   Cholesterol 125 0 - 200 mg/dL   Triglycerides 49 <366 mg/dL   HDL 30 (L) >44 mg/dL   Total CHOL/HDL Ratio 4.2 RATIO   VLDL 10 0 - 40 mg/dL   LDL Cholesterol 85 0 - 99 mg/dL    Comment:        Total Cholesterol/HDL:CHD Risk Coronary Heart Disease Risk Table                     Men   Women  1/2 Average Risk   3.4   3.3  Average Risk       5.0   4.4  2 X Average Risk   9.6   7.1  3 X Average Risk  23.4   11.0        Use the calculated Patient Ratio above and the CHD Risk Table to determine the patient's CHD Risk.        ATP III CLASSIFICATION (LDL):  <100     mg/dL   Optimal  034-742   mg/dL   Near or Above                    Optimal  130-159  mg/dL   Borderline  595-638  mg/dL   High  >756     mg/dL   Very High Performed at [facility], [address], [place], [place] [address]   Hemoglobin A1c     Status: Abnormal   Collection Time: 04/26/20  3:30 PM  Result Value Ref Range   Hgb A1c MFr Bld 5.8 (H) 4.8 - 5.6 %  Comment: (NOTE) Pre diabetes:          5.7%-6.4%  Diabetes:              >6.4%  Glycemic control for   <7.0% adults with diabetes    Mean Plasma Glucose 119.76 mg/dL    Comment: Performed at [facility], [address] [place], [place] [address]    DG Chest 1 View  Result Date: 04/26/2020 CLINICAL DATA:  Altered mental status. EXAM: CHEST  1 VIEW COMPARISON:  03/20/2020. FINDINGS: The heart size and mediastinal contours are stable. Left subclavian approach cardiac rhythm maintenance device. Low lung volumes without focal consolidation. No visible pleural effusions or pneumothorax. No acute osseous abnormality. IMPRESSION: Low lung volumes without evidence of acute cardiopulmonary disease. Electronically Signed   By: Feliberto Harts MD   On: 04/26/2020 08:32   CT HEAD WO CONTRAST  Result Date: 04/26/2020 CLINICAL DATA:  Altered mental status EXAM: CT HEAD WITHOUT CONTRAST TECHNIQUE: Contiguous axial images were obtained from the base of the skull through the vertex without intravenous contrast. COMPARISON:  05/29/2015 head CT and prior. FINDINGS: Brain: New focus of left frontal intraparenchymal hemorrhage measuring 5.8 x 2.9 cm (3:19) with peripheral edema. There is tracking of blood products along the anterior falx and bifrontal sulci. Rightward midline shift of 4 mm. Multiple discrete hyperdensities are seen within the left frontal lobe the largest of which measures 2.5 cm and demonstrates an internal fluid fluid level (3:20). Small volume subarachnoid hemorrhage also overlies the occipital convexities. No intraventricular extension of  hemorrhage. No ventriculomegaly. Mild cerebral atrophy with ex vacuo dilatation. Chronic microvascular ischemic changes. Vascular: No hyperdense vessel. Bilateral carotid siphon atherosclerotic calcifications. Skull: No acute fracture or focal lesion. Sinuses/Orbits: Normal orbits. Clear paranasal sinuses and mastoid air cells. Other: None. IMPRESSION: 5.8 cm left frontal intraparenchymal hemorrhage with 4 mm rightward midline shift. Multifocal left frontal hyperdensities some of which demonstrate internal fluid-fluid levels are suspicious for septic emboli. Consider MRI head with and without contrast for further evaluation. Small volume subdural and subarachnoid hemorrhage. These results were called by telephone at the time of interpretation on 04/26/2020 at 9:27 am to provider [facility] , who verbally acknowledged these results. Electronically Signed   By: Stana Bunting M.D.   On: 04/26/2020 09:39   VAS [place] LOWER EXTREMITY VENOUS (DVT)  Result Date: 04/26/2020  Lower Venous DVT Study Other Indications: Follow up dvt. Comparison Study: 03/07/20 previous Performing Technologist: Blanch Media RVS  Examination Guidelines: A complete evaluation includes B-mode imaging, spectral Doppler, color Doppler, and power Doppler as needed of all accessible portions of each vessel. Bilateral testing is considered an integral part of a complete examination. Limited examinations for reoccurring indications may be performed as noted. The reflux portion of the exam is performed with the patient in reverse Trendelenburg.  +---------+---------------+---------+-----------+----------+--------------+ RIGHT    CompressibilityPhasicitySpontaneityPropertiesThrombus Aging +---------+---------------+---------+-----------+----------+--------------+ CFV      Full           Yes      Yes                                 +---------+---------------+---------+-----------+----------+--------------+ SFJ      Full                                                         +---------+---------------+---------+-----------+----------+--------------+  FV Prox  Full                                                        +---------+---------------+---------+-----------+----------+--------------+ FV Mid   Full                                                        +---------+---------------+---------+-----------+----------+--------------+ FV DistalFull                                                        +---------+---------------+---------+-----------+----------+--------------+ PFV      Full                                                        +---------+---------------+---------+-----------+----------+--------------+ POP      Full           Yes      Yes                                 +---------+---------------+---------+-----------+----------+--------------+ PTV      Full                                                        +---------+---------------+---------+-----------+----------+--------------+ PERO     Full                                                        +---------+---------------+---------+-----------+----------+--------------+   +---------+---------------+---------+-----------+----------+--------------+ LEFT     CompressibilityPhasicitySpontaneityPropertiesThrombus Aging +---------+---------------+---------+-----------+----------+--------------+ CFV      Full           Yes      Yes                                 +---------+---------------+---------+-----------+----------+--------------+ SFJ      Full                                                        +---------+---------------+---------+-----------+----------+--------------+ FV Prox  Full                                                        +---------+---------------+---------+-----------+----------+--------------+  FV Mid   Full                                                         +---------+---------------+---------+-----------+----------+--------------+ FV DistalFull                                                        +---------+---------------+---------+-----------+----------+--------------+ PFV      Full                                                        +---------+---------------+---------+-----------+----------+--------------+ POP      Full           Yes      Yes                                 +---------+---------------+---------+-----------+----------+--------------+ PTV      Full                                                        +---------+---------------+---------+-----------+----------+--------------+ PERO     Full                                                        +---------+---------------+---------+-----------+----------+--------------+     Summary: BILATERAL: - No evidence of deep vein thrombosis seen in the lower extremities, bilaterally. - No evidence of superficial venous thrombosis in the lower extremities, bilaterally. -No evidence of popliteal cyst, bilaterally.   *See table(s) above for measurements and observations. Electronically signed by Gretta Began MD on 04/26/2020 at 3:04:19 PM.    Final     Review of Systems Blood pressure (!) 128/101, pulse 95, temperature 99.2 F (37.3 C), temperature source Oral, resp. rate (!) 28, height 5\' 6"  (1.676 m), weight 79.4 kg, SpO2 97 %. Physical Exam Neurological:     Mental Status: He is alert.     Motor: Motor function is intact.     Deep Tendon Reflexes: Babinski sign absent on the right side. Babinski sign absent on the left side.     Comments: Alert, repetitive chewing. Not following commands Moving all extremities. Mute Perrl, full eom Symmetric facial movements Cannot perform detailed sensory exam.  Gait not assessed.       Assessment/Plan: Luke Crosby is a 79 y.o. male Who may have a traumatic left frontal hemorrhage, versus an ICU unknown etiology.  Blood pressure control instituted. He also was reversed with [employer]. If no change in the hematoma no indication for surgery. Will repeat head ct.  Awaiting EEG read.  Stable at this time. Family does not want  heroic measures but would consent to operative decompression if I felt it was indicated.  Will follow  Coletta MemosKyle Trisa Cranor 04/26/2020, 6:43 PM

## 2020-04-26 NOTE — Consult Note (Addendum)
Neurology Consultation  Reason for Consult: Stroke Referring Physician: Briant Sites, MD  CC: Consulted secondary to patient with intraparenchymal bleed  History is obtained from: Chart  HPI: Luke Crosby is a 79 y.o. male with history of sinus node dysfunction with pacemaker, hypertension, DVT, and pulmonary embolism (on eliqiuis).  Patient presented to the emergency room at [facility] secondary to report that patient was not acting normal, confused and somnolent.  Patient lives with his son.  Son reported that patient has not been acting himself since he was diagnosed with PE/DVT in September to which he was placed on Eliquis.  Prior to that event he was active, independent, worked out in the yard.  Since that point time he had been gradually declining.  On 9/27 through 10/1 patient did suffer from an E. coli UTI and was hospitalized.  He is also found to have cellulitis.  Over the last few weeks its been noted that he has not been eating well having more confusion.  This morning his son noted that he actually had stopped talking.  While in the ED labs showed: Bilirubin of 1.8, glucose of 109, PT 18.6 and INR 1.6, PTT 44, blood culture negative, urinalysis with small amounts of hemoglobin/5 ketones/30 protein.  CT head was obtained showing a 5.8 cm left frontal intraparenchymal hemorrhage with 4 mm rightward midline shift.  Multifocal left frontal hyperdensity some of which demonstrate internal fluid-fluid levels which are suspicious for septic emboli.  Neurosurgery was consulted in the ED [facility] who agreed with reversal of Eliquis and ICU admission at [facility].  He was reversed with vitamin K and Kcentra.  He was also placed on Cleviprex for systolic blood pressure goal less than 160. Patient received chest x-ray showed low lung volumes without evidence of acute cardiopulmonary disease.  Patient was at that time transferred to [facility] to the ICU and pulmonary critical care  was involved secondary to hypertension.  LKW: Unknown last known normal tpa given?: no, intraparenchymal bleed Premorbid modified Rankin scale (mRS): 0 in September NIHSS-unable to fully obtain as patient is unable to take part in NIH stroke scale ICH Score: 1 for GCS    Past Medical History:  Diagnosis Date  . Altered mental status 05/29/2015  . DVT (deep vein thrombosis) in pregnancy   . Hypertension   . Pulmonary embolism (HCC)   . Sinus node dysfunction (HCC)    With pacemaker    Family History  Problem Relation Age of Onset  . Hypertension Sister    Social History:   reports that he has never smoked. He has quit using smokeless tobacco.  His smokeless tobacco use included chew. He reports that he does not drink alcohol and does not use drugs.  Medications  Current Facility-Administered Medications:  .  Chlorhexidine Gluconate Cloth 2 % PADS 6 each, 6 each, Topical, Daily, Briant Sites, DO, 6 each at 04/26/20 1303 .  clevidipine (CLEVIPREX) infusion 0.5 mg/mL, 0-21 mg/hr, Intravenous, Continuous, Sabas Sous, MD, Last Rate: 8 mL/hr at 04/26/20 1146, 4 mg/hr at 04/26/20 1146 .  diltiazem (CARDIZEM) 125 mg in dextrose 5% 125 mL (1 mg/mL) infusion, 5-15 mg/hr, Intravenous, Titrated, Simpson, Paula B, NP, Last Rate: 5 mL/hr at 04/26/20 1435, 5 mg/hr at 04/26/20 1435 .  docusate sodium (COLACE) capsule 100 mg, 100 mg, Oral, BID PRN, Briant Sites, DO .  ondansetron [facility]) injection 4 mg, 4 mg, Intravenous, Q6H PRN, Briant Sites, DO .  polyethylene glycol (MIRALAX /  GLYCOLAX) packet 17 g, 17 g, Oral, Daily PRN, Briant Sites, DO  Current Outpatient Medications  Medication Instructions  . amoxicillin (AMOXIL) 500 mg, Oral, 3 times daily  . apixaban (ELIQUIS) 5 mg, Oral, 2 times daily  . colchicine 0.6 mg, Oral, 2 times daily    ROS:   Unable to obtain due to patient is mute.   Exam: Current vital signs: BP (!) 122/98 (BP Location: Right Arm)   Pulse  (!) 113   Temp 99.3 F (37.4 C) (Oral)   Resp (!) 28   Ht 5\' 6"  (1.676 m)   Wt 79.4 kg   SpO2 99%   BMI 28.25 kg/m  Vital signs in last 24 hours: Temp:  [98.4 F (36.9 C)-99.3 F (37.4 C)] 99.3 F (37.4 C) (11/03 1300) Pulse Rate:  [105-140] 113 (11/03 1300) Resp:  [17-38] 28 (11/03 1300) BP: (114-163)/(89-127) 122/98 (11/03 1300) SpO2:  [91 %-100 %] 99 % (11/03 1300) Weight:  [74 kg-83.9 kg] 79.4 kg (11/03 1300)   Constitutional: Patient is thin Eyes: No scleral injection HENT: No OP obstruction --  Near constant chewing that nursing reports has been ongoing all day Head: Normocephalic.  Cardiovascular: Palpable Respiratory: Effort normal, non-labored breathing GI: Soft.  No distension. There is no tenderness.  Skin: WDI  Neuro: Mental Status: Patient is in bed, he is awake, he looks around the room and follows practitioner.  He is mute and does not follow commands. Cranial Nerves: II: Blinks to threat bilaterally III,IV, VI: Doll's maneuver intact. Pupils equal, round and reactive to light V: Winces to noxious stimuli VII: Facial movement is symmetric.  VIII: Looks to practitioner when mentioning his name X: Unable to evaluate XI: Unable to evaluate XII: unable to evaluate.  Motor: Patient is not moving extremities spontaneously however when PROM he shows increased tone in the upper extremities and significant increased tone in the lower extremities Sensory: Winces to pain in the upper extremities but does not withdrawal, no real response to noxious stimuli in lower extremities Deep Tendon Reflexes: 2+ and symmetric in the biceps and patellae.  Plantars: Toes are downgoing bilaterally.  Cerebellar: Unable to evaluate  Labs I have reviewed labs in epic and the results pertinent to this consultation are:   CBC    Component Value Date/Time   WBC 5.9 04/26/2020 0735   RBC 4.40 04/26/2020 0735   HGB 12.9 (L) 04/26/2020 0735   HGB 14.7 10/14/2016 1335   HCT  40.7 04/26/2020 0735   HCT 43.5 10/14/2016 1335   PLT 167 04/26/2020 0735   PLT 116 (L) 10/14/2016 1335   MCV 92.5 04/26/2020 0735   MCV 87 10/14/2016 1335   MCH 29.3 04/26/2020 0735   MCHC 31.7 04/26/2020 0735   RDW 13.9 04/26/2020 0735   RDW 13.9 10/14/2016 1335   LYMPHSABS 0.4 (L) 04/26/2020 0735   LYMPHSABS 1.1 10/14/2016 1335   MONOABS 0.5 04/26/2020 0735   EOSABS 0.0 04/26/2020 0735   EOSABS 0.1 10/14/2016 1335   BASOSABS 0.0 04/26/2020 0735   BASOSABS 0.0 10/14/2016 1335    CMP     Component Value Date/Time   NA 137 04/26/2020 0735   NA 141 10/14/2016 1335   K 3.5 04/26/2020 0735   CL 100 04/26/2020 0735   CO2 29 04/26/2020 0735   GLUCOSE 109 (H) 04/26/2020 0735   BUN 13 04/26/2020 0735   BUN 23 10/14/2016 1335   CREATININE 1.09 04/26/2020 0735   CREATININE 0.90 08/17/2015 1604   CALCIUM  9.0 04/26/2020 0735   PROT 6.6 04/26/2020 0735   ALBUMIN 3.6 04/26/2020 0735   AST 26 04/26/2020 0735   ALT 20 04/26/2020 0735   ALKPHOS 66 04/26/2020 0735   BILITOT 1.8 (H) 04/26/2020 0735   GFRNONAA >60 04/26/2020 0735   GFRAA >60 03/21/2020 0414    Lipid Panel     Component Value Date/Time   CHOL 136 05/30/2015 0555   TRIG 85 05/30/2015 0555   HDL 31 (L) 05/30/2015 0555   CHOLHDL 4.4 05/30/2015 0555   VLDL 17 05/30/2015 0555   LDLCALC 88 05/30/2015 0555     Imaging I have reviewed the images obtained:  CT-scan of the brain- a 5.8 cm left frontal intraparenchymal hemorrhage with 4 mm rightward midline shift.  Multifocal left frontal hyperdensity some of which demonstrate internal fluid-fluid levels which are suspicious for septic emboli.    Felicie Morn PA-C [facility] [phone]  M-F  (9:00 am- 5:00 PM)  04/26/2020, 2:36 PM     Assessment:  This is an unfortunate gentleman who suffered from DVT which led to a pulmonary embolism to which he was placed on Eliquis.  Since that point time patient had not been himself per son.  Over the past few  weeks the son had noticed that he had a decline in his mentation and overall activity.  This morning patient was found to be mute.  He was [facility] to [facility] to which he was found to have a left frontal intraparenchymal hemorrhage as noted above.  Patient required reversal of Eliquis with Kcentra and vitamin K.  He also required Cleviprex to which she was then transferred to [facility] currently he does not follow commands, he is mute, he has increased tone bilateral upper extremities and significant increased tone in bilateral lower extremities.  Due to multifocal left frontal hyperdensities in which some demonstrate internal fluid-fluid levels there is a suspicion for septic emboli.  In addition given the cortical location and the cortical hemorrhage as well as a constant chewing motions I am concerned for the possibility of focal status   Impression: -Left frontal intraparenchymal hemorrhage  Recommend -MRI of the brain without contrast if possible with patient's pacemaker -MRA Head without contrast and carotid Dopplers -Repeat head CT tomorrow -Transthoracic Echo, will need TEE if TTE is negative to more definitively eval for endocarditis  --Daily blood cultures for at least 2 days, will obtain an extra culture today for increased sensitivity -No anticoagulants or antiplatelets outside of a life-threatening indication --no need for IVC filter to reduce risk of further PE given no current clot on today's LE duplex -Start or continue Atorvastatin 80 mg/other high intensity statin for LDL goal < 70  -BP goal: Systolically less than than 160 -HBAIC and Lipid profile -Telemetry monitoring -Frequent neuro checks -NPO until passes stroke swallow screen -PT/OT --STAT EEG  # please page stroke NP  Or  PA  Or MD from 8am -4 pm  as this patient from this time will be  followed by the stroke.   You can look them up on [email or web address]  Password [email or web address]  Brooke Dare MD-PhD [facility] [phone]

## 2020-04-26 NOTE — ED Provider Notes (Signed)
[facility]-EMERGENCY DEPT [facility] Emergency Department Provider Note MRN:  [id number]  Arrival date & time: 04/26/20     Chief Complaint   Altered Mental Status   History of Present Illness   Luke Crosby is a 79 y.o. year-old male with a history of pulmonary embolism presenting to the ED with chief complaint of altered mental status.  Reportedly not acting normal, confused, somnolent per son.  Recent kidney infection.  Unable to reach son for more information patient is somnolent and unable to answer questions.  I was unable to obtain an accurate HPI, PMH, or ROS due to the patient's altered mental status.  Level 5 caveat.  Review of Systems  Positive for altered mental status.  Patient's Health History    Past Medical History:  Diagnosis Date  . Altered mental status 05/29/2015  . Hypertension   . Sinus node dysfunction [facility])     Past Surgical History:  Procedure Laterality Date  . INSERT / REPLACE / REMOVE PACEMAKER  ~ 2009   Medtronic Versa [id number]  . LAPAROSCOPIC CHOLECYSTECTOMY      Family History  Problem Relation Age of Onset  . Hypertension Sister     Social History   Socioeconomic History  . Marital status: Widowed    Spouse name: Not on file  . Number of children: Not on file  . Years of education: Not on file  . Highest education level: Not on file  Occupational History  . Not on file  Tobacco Use  . Smoking status: Never Smoker  . Smokeless tobacco: Former Neurosurgeon    Types: Chew  . Tobacco comment: 05/30/2015 "chewed tobacco for 40 yrs; quit early 2000's"  Substance and Sexual Activity  . Alcohol use: No  . Drug use: No  . Sexual activity: Not Currently  Other Topics Concern  . Not on file  Social History Narrative  . Not on file   Social Determinants of Health   Financial Resource Strain:   . Difficulty of Paying Living Expenses: Not on file  Food Insecurity:   . Worried About Programme researcher, broadcasting/film/video in the Last Year: Not on file  . Ran  Out of Food in the Last Year: Not on file  Transportation Needs:   . Lack of Transportation (Medical): Not on file  . Lack of Transportation (Non-Medical): Not on file  Physical Activity:   . Days of Exercise per Week: Not on file  . Minutes of Exercise per Session: Not on file  Stress:   . Feeling of Stress : Not on file  Social Connections:   . Frequency of Communication with Friends and Family: Not on file  . Frequency of Social Gatherings with Friends and Family: Not on file  . Attends Religious Services: Not on file  . Active Member of Clubs or Organizations: Not on file  . Attends Banker Meetings: Not on file  . Marital Status: Not on file  Intimate Partner Violence:   . Fear of Current or Ex-Partner: Not on file  . Emotionally Abused: Not on file  . Physically Abused: Not on file  . Sexually Abused: Not on file     Physical Exam   Vitals:   04/26/20 1000 04/26/20 1015  BP: (!) 154/110 (!) 151/123  Pulse: (!) 134 (!) 134  Resp: (!) 38 (!) 28  Temp:    SpO2: 99% 95%    CONSTITUTIONAL: Well-appearing, NAD NEURO: Deeply somnolent, groans and localizes pain with sternal rub, pupils  1 to 2 mm EYES:  eyes equal and reactive ENT/NECK:  no LAD, no JVD CARDIO: Tachycardic rate, well-perfused, normal S1 and S2 PULM:  CTAB no wheezing or rhonchi GI/GU:  normal bowel sounds, non-distended, non-tender MSK/SPINE:  No gross deformities, no edema SKIN:  no rash, atraumatic PSYCH:  Appropriate speech and behavior  *Additional and/or pertinent findings included in MDM below  Diagnostic and Interventional Summary    EKG Interpretation  Date/Time:  Wednesday April 26 2020 07:35:52 EDT Ventricular Rate:  125 PR Interval:    QRS Duration: 135 QT Interval:  370 QTC Calculation: 494 R Axis:   46 Text Interpretation: Atrial fibrillation Ventricular premature complex Right bundle branch block Confirmed by Kennis Carina [phone]) on 04/26/2020 8:08:46 AM       Labs Reviewed  COMPREHENSIVE METABOLIC PANEL - Abnormal; Notable for the following components:      Result Value   Glucose, Bld 109 (*)    Total Bilirubin 1.8 (*)    All other components within normal limits  CBC WITH DIFFERENTIAL/PLATELET - Abnormal; Notable for the following components:   Hemoglobin 12.9 (*)    Lymphs Abs 0.4 (*)    All other components within normal limits  PROTIME-INR - Abnormal; Notable for the following components:   Prothrombin Time 18.6 (*)    INR 1.6 (*)    All other components within normal limits  APTT - Abnormal; Notable for the following components:   aPTT 44 (*)    All other components within normal limits  URINALYSIS, ROUTINE W REFLEX MICROSCOPIC - Abnormal; Notable for the following components:   Hgb urine dipstick SMALL (*)    Ketones, ur 5 (*)    Protein, ur 30 (*)    All other components within normal limits  BRAIN NATRIURETIC PEPTIDE - Abnormal; Notable for the following components:   B Natriuretic Peptide 120.0 (*)    All other components within normal limits  BLOOD GAS, VENOUS - Abnormal; Notable for the following components:   pO2, Ven <31.0 (*)    Acid-Base Excess 5.1 (*)    All other components within normal limits  CULTURE, BLOOD (SINGLE)  RESPIRATORY PANEL BY RT PCR (FLU A&B, COVID)  URINE CULTURE  LACTIC ACID, PLASMA  LACTIC ACID, PLASMA  TROPONIN I (HIGH SENSITIVITY)  TROPONIN I (HIGH SENSITIVITY)    CT HEAD WO CONTRAST  Final Result    DG Chest 1 View  Final Result      Medications  prothrombin complex conc human (KCENTRA) IVPB 1,491 Units (1,491 Units Intravenous New Bag/Given 04/26/20 1001)  phytonadione (VITAMIN K) 10 mg in dextrose 5 % 50 mL IVPB (10 mg Intravenous New Bag/Given 04/26/20 1011)  sodium chloride 0.9 % bolus 1,000 mL (0 mLs Intravenous Stopped 04/26/20 0833)  cefTRIAXone (ROCEPHIN) 2 g in sodium chloride 0.9 % 100 mL IVPB ( Intravenous Stopped 04/26/20 0823)  iohexol (OMNIPAQUE) 350 MG/ML injection 100  mL (75 mLs Intravenous Contrast Given 04/26/20 0859)  metoprolol tartrate (LOPRESSOR) injection 5 mg (5 mg Intravenous Given 04/26/20 1006)     Procedures  /  Critical Care .Critical Care Performed by: Sabas Sous, MD Authorized by: Sabas Sous, MD   Critical care provider statement:    Critical care time (minutes):  32   Critical care was necessary to treat or prevent imminent or life-threatening deterioration of the following conditions:  CNS failure or compromise (Intraparenchymal hemorrhage of the brain)   Critical care was time spent personally by me on the  following activities:  Discussions with consultants, evaluation of patient's response to treatment, examination of patient, ordering and performing treatments and interventions, ordering and review of laboratory studies, ordering and review of radiographic studies, pulse oximetry, re-evaluation of patient's condition, obtaining history from patient or surrogate and review of old charts    ED Course and Medical Decision Making  I have reviewed the triage vital signs, the nursing notes, and pertinent available records from the EMR.  Listed above are laboratory and imaging tests that I personally ordered, reviewed, and interpreted and then considered in my medical decision making (see below for details).  Altered mental status, reported recent kidney infection and so considering sepsis, also considering CNS bleeding given his history of anticoagulation, considering metabolic disarray, less concern for acute ischemic stroke as patient seems to have normal tone and ability to move extremities when they are provided with painful stimulus though he is deeply somnolent here in the emergency department.  Pupils are small, I do not see any opioids on his med list and he is breathing 20+ times per minute and so thought to be less likely an opioid toxidrome.  Protecting airway, vital signs overall reassuring, will obtain rectal temperature,  obtain CT head, labs, given report of recent kidney infection will empirically treat with fluids and antibiotics. Clinical Course as of Apr 26 1022  Wed Apr 26, 2020  [id number] Son now at bedside describing a progressively worsening confusion over the past 2 days.  Patient is more alert now but cannot follow commands, moves all extremities, very quiet and does not wish to speak which raises some concern for aphasia, will add on CTA head and neck to the Noncon CT.  Given this last known well/normal of 2 days ago, holding off on code stroke initiation at this time.   [MB]    Clinical Course User Index [MB] Sabas Sous, MD     CT head without contrast reveals large frontal lobe intraparenchymal hemorrhage, signs of layering blood.  These findings discussed with Dr. Franky Macho of neurosurgery, who agrees with Kcentra reversal agent and admission to neuro ICU.  Discussed case with Briant Sites of the intensivist service, who accepts patient for admission to the [facility] neuro ICU, there is 1 bed available and patient is assigned to this bed and [facility] will be arriving within the hour to pick patient up.  Patient's blood pressure seems adequately controlled at this time, 140s over 100s.  Would intervene with systolics over 160.  Patient is exhibiting A. fib with RVR with rates ranging between 120 and 140, providing prn metoprolol IV pushes.  Elmer Sow. Pilar Plate, MD [facility] Emergency Medicine [facility] [email or web address]  Final Clinical Impressions(s) / ED Diagnoses     ICD-10-CM   1. Intraparenchymal hematoma of brain, left, without loss of consciousness, initial encounter (HCC)  S06.350A   2. AMS (altered mental status)  R41.82 DG Chest 1 View    DG Chest 1 View  3. Anticoagulated  Z79.01   4. Altered mental status, unspecified altered mental status type  R41.82     ED Discharge Orders    None       Discharge Instructions Discussed with and Provided to Patient:    Discharge Instructions   None       Sabas Sous, MD 04/26/20 1024

## 2020-04-26 NOTE — ED Notes (Signed)
Family updated as to patient's status.

## 2020-04-26 NOTE — ED Notes (Signed)
Patient transported to CT 

## 2020-04-26 NOTE — ED Notes (Signed)
[facility] has arrived at bedside.

## 2020-04-26 NOTE — ED Notes (Signed)
CRITICAL VALUE ALERT  Critical Value:  po2 less thank 31  Date & Time Notied:  04/26/2020, 0831  Provider Notified:  Dr. Pilar Plate  Orders Received/Actions taken: see chart

## 2020-04-26 NOTE — Progress Notes (Signed)
Lower extremity venous has been completed.   Preliminary results in CV Proc.   Blanch Media 04/26/2020 2:56 PM

## 2020-04-26 NOTE — Procedures (Signed)
Patient Name: MIKAI MEINTS  MRN: [id number]  Epilepsy Attending: Charlsie Quest  Referring Physician/Provider: Levon Hedger, PA Date: 04/26/2020  Duration: 25.30 mins  Patient history: 79yo M with left frontal ICH. EEG to evaluate for seizue  Level of alertness: Awake  AEDs during EEG study: None  Technical aspects: This EEG study was done with scalp electrodes positioned according to the 10-20 International system of electrode placement. Electrical activity was acquired at a sampling rate of 500Hz  and reviewed with a high frequency filter of 70Hz  and a low frequency filter of 1Hz . EEG data were recorded continuously and digitally stored.   Description: No posterior dominant rhythm was seen. EEG showed continuous generalized and maximal left frontal region 3 to 6 Hz theta-delta slowing. Hyperventilation and photic stimulation were not performed.     ABNORMALITY -Continuous slow, generalized and maximal left frontal region  IMPRESSION: This study is suggestive of cortical dysfunction in left frontal region consistent with underlying ICH. There is also moderate diffuse encephalopathy, nonspecific etiology. No seizures or epileptiform discharges were seen throughout the recording.  Iyanah Demont 

## 2020-04-26 NOTE — Progress Notes (Signed)
STAT EEG complete - results pending. ? ?

## 2020-04-26 NOTE — ED Notes (Signed)
ED Provider at bedside. 

## 2020-04-26 NOTE — ED Triage Notes (Signed)
Pt brought in by [facility] for c/o increased ams; pt was recently diagnosed with a kidney infection and the son told ems pt was acting confused

## 2020-04-27 ENCOUNTER — Inpatient Hospital Stay (HOSPITAL_COMMUNITY): Payer: Medicare HMO

## 2020-04-27 DIAGNOSIS — R4182 Altered mental status, unspecified: Secondary | ICD-10-CM | POA: Diagnosis not present

## 2020-04-27 DIAGNOSIS — I482 Chronic atrial fibrillation, unspecified: Secondary | ICD-10-CM

## 2020-04-27 DIAGNOSIS — I10 Essential (primary) hypertension: Secondary | ICD-10-CM | POA: Diagnosis not present

## 2020-04-27 DIAGNOSIS — I611 Nontraumatic intracerebral hemorrhage in hemisphere, cortical: Secondary | ICD-10-CM

## 2020-04-27 DIAGNOSIS — Z7901 Long term (current) use of anticoagulants: Secondary | ICD-10-CM | POA: Diagnosis not present

## 2020-04-27 DIAGNOSIS — Z86718 Personal history of other venous thrombosis and embolism: Secondary | ICD-10-CM

## 2020-04-27 LAB — BASIC METABOLIC PANEL
Anion gap: 11 (ref 5–15)
BUN: 11 mg/dL (ref 8–23)
CO2: 25 mmol/L (ref 22–32)
Calcium: 8.7 mg/dL — ABNORMAL LOW (ref 8.9–10.3)
Chloride: 102 mmol/L (ref 98–111)
Creatinine, Ser: 1.05 mg/dL (ref 0.61–1.24)
GFR, Estimated: >60 mL/min (ref >60)
Glucose, Bld: 118 mg/dL — ABNORMAL HIGH (ref 70–99)
Potassium: 3.6 mmol/L (ref 3.5–5.1)
Sodium: 138 mmol/L (ref 135–145)

## 2020-04-27 LAB — CBC
HCT: 38.5 % — ABNORMAL LOW (ref 39.0–52.0)
Hemoglobin: 12.6 g/dL — ABNORMAL LOW (ref 13.0–17.0)
MCH: 29.8 pg (ref 26.0–34.0)
MCHC: 32.7 g/dL (ref 30.0–36.0)
MCV: 91 fL (ref 80.0–100.0)
Platelets: 141 10*3/uL — ABNORMAL LOW (ref 150–400)
RBC: 4.23 MIL/uL (ref 4.22–5.81)
RDW: 14 % (ref 11.5–15.5)
WBC: 8.2 10*3/uL (ref 4.0–10.5)
nRBC: 0 % (ref 0.0–0.2)

## 2020-04-27 LAB — URINE CULTURE: Culture: NO GROWTH

## 2020-04-27 LAB — PROTIME-INR
INR: 1.3 — ABNORMAL HIGH (ref 0.8–1.2)
Prothrombin Time: 16.1 seconds — ABNORMAL HIGH (ref 11.4–15.2)

## 2020-04-27 MED ORDER — IOHEXOL 350 MG/ML SOLN
75.0000 mL | Freq: Once | INTRAVENOUS | Status: AC | PRN
  Administered 2020-04-27: 75 mL via INTRAVENOUS

## 2020-04-27 MED ORDER — POTASSIUM CHLORIDE 10 MEQ/100ML IV SOLN
10.0000 meq | INTRAVENOUS | Status: AC
  Administered 2020-04-27 (×4): 10 meq via INTRAVENOUS
  Filled 2020-04-27 (×4): qty 100

## 2020-04-27 MED ORDER — PANTOPRAZOLE SODIUM 40 MG IV SOLR
40.0000 mg | INTRAVENOUS | Status: DC
  Administered 2020-04-27: 40 mg via INTRAVENOUS
  Filled 2020-04-27 (×2): qty 40

## 2020-04-27 NOTE — Progress Notes (Signed)
STROKE TEAM PROGRESS NOTE   INTERVAL HISTORY His son is at the bedside.  Pt obtunded, only able to briefly open eyes with voice and pain. He had constant jaw and mouth involuntary movement, received ativan 2mg  last night at 9:30pm, unchanged. EEG no seizure, repeat CT head showed largely stable hematoma and midline shift. On cleviprex. NPO. Blood culture pending   OBJECTIVE Vitals:   04/27/20 0200 04/27/20 0300 04/27/20 0400 04/27/20 0500  BP: (!) 146/109 (!) 124/92 128/87 (!) 118/95  Pulse: (!) 107 79 97 87  Resp: (!) 26 18 20 18   Temp:   99.4 F (37.4 C)   TempSrc:   Oral   SpO2: 98% 99% 98% 98%  Weight:      Height:        CBC:  Recent Labs  Lab 04/26/20 0735 04/27/20 0350  WBC 5.9 8.2  NEUTROABS 4.9  --   HGB 12.9* 12.6*  HCT 40.7 38.5*  MCV 92.5 91.0  PLT 167 141*    Basic Metabolic Panel:  Recent Labs  Lab 04/26/20 0735 04/27/20 0350  NA 137 138  K 3.5 3.6  CL 100 102  CO2 29 25  GLUCOSE 109* 118*  BUN 13 11  CREATININE 1.09 1.05  CALCIUM 9.0 8.7*    Lipid Panel:     Component Value Date/Time   CHOL 125 04/26/2020 1530   TRIG 49 04/26/2020 1530   HDL 30 (L) 04/26/2020 1530   CHOLHDL 4.2 04/26/2020 1530   VLDL 10 04/26/2020 1530   LDLCALC 85 04/26/2020 1530   HgbA1c:  Lab Results  Component Value Date   HGBA1C 5.8 (H) 04/26/2020   Urine Drug Screen: No results found for: LABOPIA, COCAINSCRNUR, LABBENZ, AMPHETMU, THCU, LABBARB  Alcohol Level No results found for: [facility]  IMAGING  EEG 04/26/2020 IMPRESSION:  This study is suggestive of cortical dysfunction in left frontal region consistent with underlying ICH. There is also moderate diffuse encephalopathy, nonspecific etiology. No seizures or epileptiform discharges were seen throughout the recording.  DG Chest 1 View 04/26/2020 IMPRESSION:  Low lung volumes without evidence of acute cardiopulmonary disease.  CT HEAD WO CONTRAST 04/26/2020 IMPRESSION:   5.8 cm left frontal  intraparenchymal hemorrhage with 4 mm rightward midline shift. Multifocal left frontal hyperdensities some of which demonstrate internal fluid-fluid levels are suspicious for septic emboli.   Consider MRI head with and without contrast for further evaluation.   Small volume subdural and subarachnoid hemorrhage.   CT HEAD WO CONTRAST 04/27/2020 IMPRESSION:  Left frontal lobe intraparenchymal hemorrhages appear more confluent and more numerous as compared to the exam of 04/26/2020. However, in conglomerate the hemorrhages have not significantly changed in size encompassing an area measuring 6.6 x 2.9 x 4.8 cm. Unchanged mass effect with partial effacement of the left lateral ventricle and 4 mm rightward midline shift.  Small volume intraventricular hemorrhage within the left greater than right occipital horns, new from the prior examination. No evidence of hydrocephalus.  Unchanged scattered small volume subarachnoid hemorrhage along the bilateral cerebral hemispheres, most notably along the anterior frontal lobes and right occipital lobe.  ECHOCARDIOGRAM COMPLETE BUBBLE STUDY 04/26/2020 IMPRESSIONS   1. Left ventricular ejection fraction, by estimation, is 55%. The left ventricle has low normal function. The left ventricle has no regional wall motion abnormalities. There is mild asymmetric left ventricular hypertrophy of the septal segment. Left ventricular diastolic parameters are indeterminate.   2. Right ventricular systolic function is mildly reduced. The right ventricular size is mildly enlarged. There  is moderately elevated pulmonary artery systolic pressure. The estimated right ventricular systolic pressure is 47.3 mmHg.   3. Left atrial size was mildly dilated.   4. The mitral valve is normal in structure. Trivial mitral valve regurgitation. No evidence of mitral stenosis.   5. Tricuspid valve regurgitation is mild to moderate.   6. The aortic valve is grossly normal. There is  mild calcification of the aortic valve. Aortic valve regurgitation is mild. No aortic stenosis is present.   7. Aortic dilatation noted. There is mild dilatation of the ascending aorta, measuring 42 mm.  8. The inferior vena cava is dilated in size with <50% respiratory variability, suggesting right atrial pressure of 15 mmHg.  No intracardiac source of embolism detected on this transthoracic study.   VAS [place] LOWER EXTREMITY VENOUS (DVT) 04/26/2020 Summary: BILATERAL: - No evidence of deep vein thrombosis seen in the lower extremities, bilaterally. - No evidence of superficial venous thrombosis in the lower extremities, bilaterally. -No evidence of popliteal cyst, bilaterally.   ECG - atrial fibrillation - ventricular response 125 BPM (See cardiology reading for complete details)   PHYSICAL EXAM  Temp:  [98.9 F (37.2 C)-99.5 F (37.5 C)] 98.9 F (37.2 C) (11/04 0800) Pulse Rate:  [61-151] 76 (11/04 1000) Resp:  [17-40] 18 (11/04 1000) BP: (101-157)/(73-127) 101/73 (11/04 1000) SpO2:  [90 %-100 %] 97 % (11/04 1000) Weight:  [79.4 kg] 79.4 kg (11/03 1300)  General - Well nourished, well developed, obtunded.  Ophthalmologic - fundi not visualized due to noncooperation.  Cardiovascular - irregularly irregular heart rate and rhythm.  Neuro - obtunded, eyes closed, only briefly open with voice and pain. However, actively against eyelid opening. Not following commands, nonverbal. With forced eye opening, eyes in mid position, not blinking to visual threat, doll's eyes not cooperative, not tracking, PERRL. Corneal reflex present, gag and cough present. Right facial droop on grimace.  Tongue protrusion not cooperative. On pain stimulation, localizing to pain on the left UE and LE, against gravity. However, RUE deltoid 1/5 and bicep 3-/5 but finger grip not cooperative. Mild withdraw at RLE with pain. Sensation, coordination and gait not tested.   ASSESSMENT/PLAN [name] is a 79  y.o. male with history of sinus node dysfunction with pacemaker, hypertension, recent UTI, recent cellulitis, DVT, and pulmonary embolism (on eliqiuis) presenting [facility] secondary to AMS, somnolence, non verbal state, with poor PO intake and hx of gradual decline since starting Eliquis. CT head - 5.8 cm left frontal intraparenchymal hemorrhage with 4 mm rightward midline shift, suspicious for septic emboli. Neurosurgery consulted. Eliquis reversed with vitamin K and Kcentra. He did not receive IV t-PA due to ICH.  ICH: left frontal ICH - HTN in the setting of eliquis vs. septic emboli.  CT head - 5.8 cm left frontal intraparenchymal hemorrhage with 4 mm rightward midline shift. Multifocal left frontal hyperdensities some of which demonstrate internal fluid-fluid levels are suspicious for septic emboli. Small volume subdural and subarachnoid hemorrhage.   CT Head repeat - hemorrhages have not significantly changed in size. Unchanged mass effect with partial effacement of the left lateral ventricle and 4 mm rightward midline shift.   CTA head and neck unremarkable, no mycotic aneurysm. Unchanged hematoma  MRI not able to perform due to pacer incompatible with MRI  EEG - moderate diffuse encephalopathy, nonspecific etiology.   2D Echo - EF 55%. No cardiac source of emboli identified.   [place] Corona Virus 2 - negative  LDL - 85  HgbA1c - 5.8  VTE prophylaxis - SCDs  Eliquis (apixaban) daily prior to admission, now on No antithrombotic. Eliquis reversed with [place].   Ongoing aggressive stroke risk factor management  Therapy recommendations:  pending  Disposition:  Pending  Afib with RVR  on IV Diltiazem  Rate controlled   On Eliquis PTA  No antithrombotics nw  Hx of PE and DVT  RLE DVT in 02/2020 - put on eliquis  This admission repeat BLE Venous Dopplers - negative for DVT  Now on no antithrombotics   Hx of recent UTI and cellulitis  Blood cultures -  pending  Rocephin x 1 dose for suspected UTI.   UA neg. Urine culture - pending  Hypertension  Home BP meds: none   On delitizem  Stable . SBP goal < 160 . Long-term BP goal normotensive  Hyperlipidemia  Home Lipid lowering medication: none  LDL 85, goal < 70  No statin at this time  May consider statin at discharge  Dysphagia   NPO   On IVF  Consider cortrak tomorrow if needed  Speech on board  Involuntary movement  Chewing movement  EEG neg  Ativan 2mg  11/3 - unchanged  Other Stroke Risk Factors  Advanced age  Overweight, Body mass index is 28.25 kg/m., recommend weight loss, diet and exercise as appropriate   Other Active Problems  Code status - Full code.  Baseline independent before 02/2020, but declined since PE/DVT, however, still able to walk independently, occasional driving, coherent conversation  Hospital day # 1  This patient is critically ill due to left frontal ICH, AMS, afib and PE DVT on eliquis, dysphagia and at significant risk of neurological worsening, death form hematoma expansion, cerebral edema, brain herniation, PE, heart failure, aspiration. This patient's care requires constant monitoring of vital signs, hemodynamics, respiratory and cardiac monitoring, review of multiple databases, neurological assessment, discussion with family, other specialists and medical decision making of high complexity. I spent 40 minutes of neurocritical care time in the care of this patient. I had long discussion with Son at bedside, updated pt current condition, treatment plan and potential prognosis, and answered all the questions. He expressed understanding and appreciation.   03/2020, MD PhD Stroke Neurology 04/27/2020 6:16 PM   To contact Stroke Continuity provider, please refer to 13/09/2019. After hours, contact General Neurology

## 2020-04-27 NOTE — Progress Notes (Addendum)
eLink Physician-Brief Progress Note Patient Name: Luke Crosby DOB: January 01, 1941 MRN: [id number]   Date of Service  04/27/2020  HPI/Events of Note  Patient received Kcentra yesterday to reverse Eliquis Rx.   eICU Interventions  Plan: 1. PT/INR now.      Intervention Category Major Interventions: Other:  Jameon Deller Dennard Nip 04/27/2020, 5:06 AM

## 2020-04-27 NOTE — Progress Notes (Signed)
Patient ID: Luke Crosby, male   DOB: 09-15-1940, 79 y.o.   MRN: [id number] BP 119/78   Pulse 86   Temp 99.1 F (37.3 C)   Resp (!) 21   Ht 5\' 6"  (1.676 m)   Wt 79.4 kg   SpO2 96%   BMI 28.25 kg/m  Luke Crosby fell, he is anticoagulated. It is highly unlikely that he has septic emboli, the read by the radiologist was not based on any clinical criteria. The fluid fluid levels in the clot were due to anticoagulation. Not sepsis.  This is an older gentleman with a large frontal hematoma, without need for surgical intervention. Will need therapies, most likely will need long term placement. The CT angio was non informative.  At this time I see little reason for an mri. According to the son Luke Crosby fell, this easily accounts for the current pathology Eyes open, not following commands, continues with chewing motion. No seizure like activity is exhibited now.

## 2020-04-27 NOTE — Progress Notes (Signed)
SLP Cancellation Note  Patient Details Name: Luke Crosby MRN: [id number] DOB: May 20, 1941   Cancelled treatment:       Reason Eval/Treat Not Completed: Patient's level of consciousness;Fatigue/lethargy limiting ability to participate (Pt unable to demonstrate an adequate level of alertness to participate in swallow evaluation. SLP will follow up on subsequent date unless contacted sooner by staff due to improvement in alertness.)  Lashane Whelpley I. Vear Clock, MS, CCC-SLP Acute Rehabilitation Services Office number [phone] Pager [phone]  Scheryl Marten 04/27/2020, 9:17 AM

## 2020-04-28 DIAGNOSIS — Z7901 Long term (current) use of anticoagulants: Secondary | ICD-10-CM | POA: Diagnosis not present

## 2020-04-28 DIAGNOSIS — I4891 Unspecified atrial fibrillation: Secondary | ICD-10-CM | POA: Diagnosis not present

## 2020-04-28 DIAGNOSIS — R401 Stupor: Secondary | ICD-10-CM

## 2020-04-28 DIAGNOSIS — I611 Nontraumatic intracerebral hemorrhage in hemisphere, cortical: Secondary | ICD-10-CM | POA: Diagnosis not present

## 2020-04-28 DIAGNOSIS — R4182 Altered mental status, unspecified: Secondary | ICD-10-CM | POA: Diagnosis not present

## 2020-04-28 LAB — BASIC METABOLIC PANEL
Anion gap: 11 (ref 5–15)
BUN: 12 mg/dL (ref 8–23)
CO2: 23 mmol/L (ref 22–32)
Calcium: 8.6 mg/dL — ABNORMAL LOW (ref 8.9–10.3)
Chloride: 103 mmol/L (ref 98–111)
Creatinine, Ser: 1 mg/dL (ref 0.61–1.24)
GFR, Estimated: >60 mL/min (ref >60)
Glucose, Bld: 123 mg/dL — ABNORMAL HIGH (ref 70–99)
Potassium: 3.7 mmol/L (ref 3.5–5.1)
Sodium: 137 mmol/L (ref 135–145)

## 2020-04-28 LAB — GLUCOSE, CAPILLARY
Glucose-Capillary: 129 mg/dL — ABNORMAL HIGH (ref 70–99)
Glucose-Capillary: 113 mg/dL — ABNORMAL HIGH (ref 70–99)
Glucose-Capillary: 143 mg/dL — ABNORMAL HIGH (ref 70–99)
Glucose-Capillary: 165 mg/dL — ABNORMAL HIGH (ref 70–99)

## 2020-04-28 LAB — MAGNESIUM
Magnesium: 1.6 mg/dL — ABNORMAL LOW (ref 1.7–2.4)
Magnesium: 2 mg/dL (ref 1.7–2.4)

## 2020-04-28 LAB — PHOSPHORUS: Phosphorus: 2.8 mg/dL (ref 2.5–4.6)

## 2020-04-28 MED ORDER — AMANTADINE HCL 50 MG/5ML PO SOLN
100.0000 mg | Freq: Two times a day (BID) | ORAL | Status: DC
  Filled 2020-04-28 (×2): qty 10

## 2020-04-28 MED ORDER — HEPARIN SODIUM (PORCINE) 5000 UNIT/ML IJ SOLN
5000.0000 [IU] | Freq: Three times a day (TID) | INTRAMUSCULAR | Status: DC
  Administered 2020-04-28 – 2020-05-13 (×44): 5000 [IU] via SUBCUTANEOUS
  Filled 2020-04-28 (×44): qty 1

## 2020-04-28 MED ORDER — MAGNESIUM SULFATE 2 GM/50ML IV SOLN
2.0000 g | Freq: Once | INTRAVENOUS | Status: AC
  Administered 2020-04-28: 2 g via INTRAVENOUS
  Filled 2020-04-28: qty 50

## 2020-04-28 MED ORDER — ORAL CARE MOUTH RINSE
15.0000 mL | Freq: Two times a day (BID) | OROMUCOSAL | Status: DC
  Administered 2020-04-29 – 2020-05-13 (×27): 15 mL via OROMUCOSAL

## 2020-04-28 MED ORDER — OSMOLITE 1.2 CAL PO LIQD
1000.0000 mL | ORAL | Status: DC
  Administered 2020-04-28 – 2020-05-10 (×12): 1000 mL
  Filled 2020-04-28 (×5): qty 1000

## 2020-04-28 MED ORDER — PROSOURCE TF PO LIQD
45.0000 mL | Freq: Every day | ORAL | Status: DC
  Administered 2020-04-28 – 2020-05-11 (×14): 45 mL
  Filled 2020-04-28 (×14): qty 45

## 2020-04-28 MED ORDER — PANTOPRAZOLE SODIUM 40 MG PO PACK
40.0000 mg | PACK | Freq: Every day | ORAL | Status: DC
  Administered 2020-04-28 – 2020-05-12 (×15): 40 mg
  Filled 2020-04-28 (×15): qty 20

## 2020-04-28 MED ORDER — VITAL HIGH PROTEIN PO LIQD: 1000.0000 mL | ORAL | Status: DC

## 2020-04-28 MED ORDER — PROSOURCE TF PO LIQD: 45.0000 mL | Freq: Two times a day (BID) | ORAL | Status: DC

## 2020-04-28 MED ORDER — CHLORHEXIDINE GLUCONATE 0.12 % MT SOLN
15.0000 mL | Freq: Two times a day (BID) | OROMUCOSAL | Status: DC
  Administered 2020-04-28 – 2020-05-13 (×29): 15 mL via OROMUCOSAL
  Filled 2020-04-28 (×24): qty 15

## 2020-04-28 MED ORDER — AMANTADINE HCL 50 MG/5ML PO SOLN
100.0000 mg | Freq: Two times a day (BID) | ORAL | Status: DC
  Administered 2020-04-29 – 2020-05-13 (×30): 100 mg
  Filled 2020-04-28 (×32): qty 10

## 2020-04-28 NOTE — Progress Notes (Signed)
Patient ID: Luke Crosby, male   DOB: 1941-03-12, 79 y.o.   MRN: [id number] BP (!) 137/98   Pulse (!) 105   Temp 99.7 F (37.6 C) (Oral)   Resp (!) 24   Ht 5\' 6"  (1.676 m)   Wt 79.1 kg   SpO2 97%   BMI 28.15 kg/m  Not following commands. Purposeful movements Perrl, grimaces symmetrically with bright light shone in eyes. Continues chewing movements No neurological changes

## 2020-04-28 NOTE — Procedures (Signed)
Cortrak  Person Inserting Tube:  Suzanna Zahn M, RD Tube Type:  Cortrak - 43 inches Tube Location:  Right nare Initial Placement:  Stomach Secured by: Bridle Technique Used to Measure Tube Placement:  Documented cm marking at nare/ corner of mouth Cortrak Secured At:  69 cm Procedure Comments:  Cortrak Tube Team Note:  Consult received to place a Cortrak feeding tube.   No x-ray is required. RN may begin using tube.   If the tube becomes dislodged please keep the tube and contact the Cortrak team at [email or web address] ([email or web address]) for replacement.  If after hours and replacement cannot be delayed, place a NG tube and confirm placement with an abdominal x-ray.      Phillis Thackeray, MS, RD, LDN, CNSC Inpatient Clinical Dietitian RD pager # available in AMION  After hours/weekend pager # available in AMION      

## 2020-04-28 NOTE — Progress Notes (Signed)
Initial Nutrition Assessment  DOCUMENTATION CODES:   Not applicable  INTERVENTION:   Tube Feeding via Cortrak: Osmolite 1.2 at 65 ml/hr Begin at 20 ml/hr; titrate by 10 mL q 8 hours until goal rate of 65 ml/hr Provides 1872 kcals, 87 g  protein, 1264 mL of free water Meets 100% estimated calorie and protein needs  NUTRITION DIAGNOSIS:   Inadequate oral intake related to lethargy/confusion, inability to eat as evidenced by NPO status  GOAL:   Patient will meet greater than or equal to 90% of their needs  MONITOR:   TF tolerance, Labs, Weight trends  REASON FOR ASSESSMENT:   Consult Enteral/tube feeding initiation and management  ASSESSMENT:   79 yo male admitted after hitting his head post fall with L frontal ICH. PMH includes HTN, PE/DVT  CT head with stable hematoma and midline shift; pt remains on Cleviprex  Pt minially responsive, able to open eyes, does not follow commands. Pt has a constant jaw and mouth involuntary movement.  NPO, SLP following but not appropriate for diet advanacement  Cortrak to be placed today  Current wt 79 kg  Unable to obtain diet and weight history from patient at this time  Based on weight encounters; possible weight loss between earlier this year but no wt los sin past few months.   Labs: reviewed Meds: NS at 40 ml/hr   Diet Order:   Diet Order            Diet NPO time specified  Diet effective now                 EDUCATION NEEDS:   Not appropriate for education at this time  Skin:  Skin Assessment: Reviewed RN Assessment  Last BM:  PTA  Height:   Ht Readings from Last 1 Encounters:  04/26/20 5\' 6"  (1.676 m)    Weight:   Wt Readings from Last 1 Encounters:  04/28/20 79.1 kg    BMI:  Body mass index is 28.15 kg/m.  Estimated Nutritional Needs:   Kcal:  1800-2000 kcals  Protein:  85-100 g  Fluid:  >/= 1.9 L   13/05/21 MS, RDN, LDN, CNSC Registered Dietitian III Clinical Nutrition RD Pager  and On-Call Pager Number Located in [place]

## 2020-04-28 NOTE — Progress Notes (Signed)
STROKE TEAM PROGRESS NOTE   INTERVAL HISTORY RN at bedside. Pt still lethargic and eyes close against forced opening, however, with repetitive pain stimulation, he is able to open eyes. Did not pass swallow, will put on cortrak and tube feeding. Still on IV cardizem.   OBJECTIVE Vitals:   04/28/20 0400 04/28/20 0452 04/28/20 0500 04/28/20 0600  BP: (!) 129/94  (!) 125/97 121/87  Pulse: 70  (!) 103 97  Resp: (!) 25  (!) 25 20  Temp: 99.7 F (37.6 C)     TempSrc: Axillary     SpO2: 94%  94% 93%  Weight:  79.1 kg    Height:       CBC:  Recent Labs  Lab 04/26/20 0735 04/27/20 0350  WBC 5.9 8.2  NEUTROABS 4.9  --   HGB 12.9* 12.6*  HCT 40.7 38.5*  MCV 92.5 91.0  PLT 167 141*   Basic Metabolic Panel:  Recent Labs  Lab 04/27/20 0350 04/28/20 0044  NA 138 137  K 3.6 3.7  CL 102 103  CO2 25 23  GLUCOSE 118* 123*  BUN 11 12  CREATININE 1.05 1.00  CALCIUM 8.7* 8.6*  MG  --  1.6*   Lipid Panel:     Component Value Date/Time   CHOL 125 04/26/2020 1530   TRIG 49 04/26/2020 1530   HDL 30 (L) 04/26/2020 1530   CHOLHDL 4.2 04/26/2020 1530   VLDL 10 04/26/2020 1530   LDLCALC 85 04/26/2020 1530   HgbA1c:  Lab Results  Component Value Date   HGBA1C 5.8 (H) 04/26/2020   Urine Drug Screen: No results found for: LABOPIA, COCAINSCRNUR, LABBENZ, AMPHETMU, THCU, LABBARB  Alcohol Level No results found for: [facility]  IMAGING  EEG 04/26/2020 IMPRESSION:  This study is suggestive of cortical dysfunction in left frontal region consistent with underlying ICH. There is also moderate diffuse encephalopathy, nonspecific etiology. No seizures or epileptiform discharges were seen throughout the recording.  DG Chest 1 View 04/26/2020 IMPRESSION:  Low lung volumes without evidence of acute cardiopulmonary disease.  CT HEAD WO CONTRAST 04/26/2020 IMPRESSION:   5.8 cm left frontal intraparenchymal hemorrhage with 4 mm rightward midline shift. Multifocal left frontal hyperdensities some  of which demonstrate internal fluid-fluid levels are suspicious for septic emboli.   Consider MRI head with and without contrast for further evaluation.   Small volume subdural and subarachnoid hemorrhage.   CT HEAD WO CONTRAST 04/27/2020 IMPRESSION:  Left frontal lobe intraparenchymal hemorrhages appear more confluent and more numerous as compared to the exam of 04/26/2020. However, in conglomerate the hemorrhages have not significantly changed in size encompassing an area measuring 6.6 x 2.9 x 4.8 cm. Unchanged mass effect with partial effacement of the left lateral ventricle and 4 mm rightward midline shift.  Small volume intraventricular hemorrhage within the left greater than right occipital horns, new from the prior examination. No evidence of hydrocephalus.  Unchanged scattered small volume subarachnoid hemorrhage along the bilateral cerebral hemispheres, most notably along the anterior frontal lobes and right occipital lobe.  ECHOCARDIOGRAM COMPLETE BUBBLE STUDY 04/26/2020 IMPRESSIONS   1. Left ventricular ejection fraction, by estimation, is 55%. The left ventricle has low normal function. The left ventricle has no regional wall motion abnormalities. There is mild asymmetric left ventricular hypertrophy of the septal segment. Left ventricular diastolic parameters are indeterminate.   2. Right ventricular systolic function is mildly reduced. The right ventricular size is mildly enlarged. There is moderately elevated pulmonary artery systolic pressure. The estimated right ventricular systolic  pressure is 47.3 mmHg.   3. Left atrial size was mildly dilated.   4. The mitral valve is normal in structure. Trivial mitral valve regurgitation. No evidence of mitral stenosis.   5. Tricuspid valve regurgitation is mild to moderate.   6. The aortic valve is grossly normal. There is mild calcification of the aortic valve. Aortic valve regurgitation is mild. No aortic stenosis is present.   7.  Aortic dilatation noted. There is mild dilatation of the ascending aorta, measuring 42 mm.  8. The inferior vena cava is dilated in size with <50% respiratory variability, suggesting right atrial pressure of 15 mmHg.  No intracardiac source of embolism detected on this transthoracic study.   VAS [place] LOWER EXTREMITY VENOUS (DVT) 04/26/2020 Summary: BILATERAL: - No evidence of deep vein thrombosis seen in the lower extremities, bilaterally. - No evidence of superficial venous thrombosis in the lower extremities, bilaterally. -No evidence of popliteal cyst, bilaterally.   ECG - atrial fibrillation - ventricular response 125 BPM (See cardiology reading for complete details)   PHYSICAL EXAM  Temp:  [98.7 F (37.1 C)-99.7 F (37.6 C)] 99.7 F (37.6 C) (11/05 0400) Pulse Rate:  [70-120] 97 (11/05 0600) Resp:  [18-33] 20 (11/05 0600) BP: (101-137)/(73-113) 121/87 (11/05 0600) SpO2:  [93 %-100 %] 93 % (11/05 0600) Weight:  [79.1 kg] 79.1 kg (11/05 0452)  General - Well nourished, well developed, obtunded.  Ophthalmologic - fundi not visualized due to noncooperation.  Cardiovascular - irregularly irregular heart rate and rhythm.  Neuro - obtunded, eyes closed, briefly open with sternal rub. However, actively against eyelid opening. Not following commands, nonverbal. With forced eye opening, eyes in mid position, not blinking to visual threat, doll's eyes not cooperative, not tracking, PERRL. Corneal reflex present, gag and cough present. Right facial droop on grimace.  Tongue protrusion not cooperative. On pain stimulation, localizing to pain on the left UE and LE, against gravity. However, RUE deltoid 1/5 and bicep 3-/5 but finger grip not cooperative. Mild withdraw at RLE with pain. Sensation, coordination and gait not tested.   ASSESSMENT/PLAN [name] is a 79 y.o. male with history of sinus node dysfunction with pacemaker, hypertension, recent UTI, recent cellulitis, DVT, and  pulmonary embolism (on eliqiuis) presenting to [facility] secondary to AMS, somnolence, non verbal state, with poor PO intake and hx of gradual decline since starting Eliquis. CT head - 5.8 cm left frontal intraparenchymal hemorrhage with 4 mm rightward midline shift, suspicious for septic emboli. Neurosurgery consulted. Eliquis reversed with vitamin K and Kcentra. He did not receive IV t-PA due to ICH.  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 Less likely septic emboli.  CT head - 5.8 cm left frontal intraparenchymal hemorrhage with 4 mm rightward midline shift. Multifocal left frontal hyperdensities some of which demonstrate internal fluid-fluid levels are suspicious for septic emboli. Small volume subdural and subarachnoid hemorrhage.   CT Head repeat - hemorrhages have not significantly changed in size. Unchanged mass effect with partial effacement of the left lateral ventricle and 4 mm rightward midline shift.   CTA head and neck unremarkable, no mycotic aneurysm. Unchanged hematoma  MRI not able to perform due to pacer incompatible with MRI  EEG - moderate diffuse encephalopathy, nonspecific etiology.   2D Echo - EF 55%. No cardiac source of emboli identified.   Sars Corona Virus 2 - negative  LDL - 85  HgbA1c - 5.8  VTE prophylaxis - heparin subq  Eliquis (apixaban)  daily prior to admission, now on No antithrombotic. Eliquis reversed with [place].   Ongoing aggressive stroke risk factor management  Therapy recommendations:  pending  Disposition:  Pending  Afib with RVR  on IV Diltiazem  Rate controlled   On Eliquis PTA  No antithrombotics now  Consider switch to cardizem per tube once HR controlled  Hx of PE and DVT  RLE DVT in 02/2020 - put on eliquis  This admission repeat BLE Venous Dopplers - negative for DVT  Now on no antithrombotics   Heparin subq for DVT prophylaxis   Hx of recent UTI and cellulitis  Blood cultures - NGTD  Rocephin  x 1 dose for suspected UTI.   UA neg. Urine culture neg  Hypertension  Home BP meds: none   On delitizem  Stable . SBP goal < 160 . Long-term BP goal normotensive  Hyperlipidemia  Home Lipid lowering medication: none  LDL 85, goal < 70  No statin at this time  May consider statin at discharge  Dysphagia   NPO   Off IVF  Placed cortrak on TF  Speech on board  Involuntary movement  Chewing movement  EEG neg  Ativan 2mg  11/3 - unchanged  Improving now  Lethargy / obtundation   Likely due to frontal ICH  Will add amantadine   Other Stroke Risk Factors  Advanced age  Overweight, Body mass index is 28.15 kg/m., recommend weight loss, diet and exercise as appropriate   Other Active Problems  Code status - Full code.  Gout  Baseline independent before 02/2020, but declined since PE/DVT, however, still able to walk independently, occasional driving, coherent conversation  Hospital day # 2  This patient is critically ill due to right frontal ICH, Afib RVR, recent infection, dysphagia and obtundation and at significant risk of neurological worsening, death form hematoma expansion, cerebral edema, brain herniation, heart failure, seizure, aspiration and sepsis. This patient's care requires constant monitoring of vital signs, hemodynamics, respiratory and cardiac monitoring, review of multiple databases, neurological assessment, discussion with family, other specialists and medical decision making of high complexity. I spent 35 minutes of neurocritical care time in the care of this patient.   03/2020, MD PhD Stroke Neurology 04/28/2020 9:36 AM   To contact Stroke Continuity provider, please refer to 13/10/2019. After hours, contact General Neurology

## 2020-04-28 NOTE — Progress Notes (Signed)
SLP Cancellation Note  Patient Details Name: Luke Crosby MRN: [id number] DOB: 1941-01-09   Cancelled treatment:       Reason Eval/Treat Not Completed: Patient's level of consciousness;Fatigue/lethargy limiting ability to participate (Pt was approached by this SLP and opened his eyes with verbal and tactile stimulation but was unable to maintain an adequate level of alertness to participate in the evaluation. SLP will follow up.)  Seba Madole I. Vear Clock, MS, CCC-SLP Acute Rehabilitation Services Office number [phone] Pager [phone]  Scheryl Marten 04/28/2020, 1:49 PM

## 2020-04-29 DIAGNOSIS — I611 Nontraumatic intracerebral hemorrhage in hemisphere, cortical: Secondary | ICD-10-CM | POA: Diagnosis not present

## 2020-04-29 LAB — BASIC METABOLIC PANEL
Anion gap: 6 (ref 5–15)
BUN: 17 mg/dL (ref 8–23)
CO2: 27 mmol/L (ref 22–32)
Calcium: 8.5 mg/dL — ABNORMAL LOW (ref 8.9–10.3)
Chloride: 102 mmol/L (ref 98–111)
Creatinine, Ser: 0.82 mg/dL (ref 0.61–1.24)
GFR, Estimated: >60 mL/min (ref >60)
Glucose, Bld: 162 mg/dL — ABNORMAL HIGH (ref 70–99)
Potassium: 3.5 mmol/L (ref 3.5–5.1)
Sodium: 135 mmol/L (ref 135–145)

## 2020-04-29 LAB — PHOSPHORUS
Phosphorus: 2.7 mg/dL (ref 2.5–4.6)
Phosphorus: 2.8 mg/dL (ref 2.5–4.6)

## 2020-04-29 LAB — CBC
HCT: 38.7 % — ABNORMAL LOW (ref 39.0–52.0)
Hemoglobin: 12.7 g/dL — ABNORMAL LOW (ref 13.0–17.0)
MCH: 29.8 pg (ref 26.0–34.0)
MCHC: 32.8 g/dL (ref 30.0–36.0)
MCV: 90.8 fL (ref 80.0–100.0)
Platelets: 158 10*3/uL (ref 150–400)
RBC: 4.26 MIL/uL (ref 4.22–5.81)
RDW: 14.1 % (ref 11.5–15.5)
WBC: 7.4 10*3/uL (ref 4.0–10.5)
nRBC: 0 % (ref 0.0–0.2)

## 2020-04-29 LAB — MAGNESIUM
Magnesium: 1.9 mg/dL (ref 1.7–2.4)
Magnesium: 1.9 mg/dL (ref 1.7–2.4)

## 2020-04-29 LAB — GLUCOSE, CAPILLARY
Glucose-Capillary: 153 mg/dL — ABNORMAL HIGH (ref 70–99)
Glucose-Capillary: 125 mg/dL — ABNORMAL HIGH (ref 70–99)
Glucose-Capillary: 147 mg/dL — ABNORMAL HIGH (ref 70–99)
Glucose-Capillary: 138 mg/dL — ABNORMAL HIGH (ref 70–99)
Glucose-Capillary: 140 mg/dL — ABNORMAL HIGH (ref 70–99)
Glucose-Capillary: 153 mg/dL — ABNORMAL HIGH (ref 70–99)

## 2020-04-29 MED ORDER — DILTIAZEM HCL 60 MG PO TABS
60.0000 mg | ORAL_TABLET | Freq: Four times a day (QID) | ORAL | Status: DC
  Filled 2020-04-29 (×3): qty 1

## 2020-04-29 MED ORDER — DILTIAZEM HCL 60 MG PO TABS
60.0000 mg | ORAL_TABLET | Freq: Four times a day (QID) | ORAL | Status: DC
  Administered 2020-04-29 – 2020-04-30 (×3): 60 mg
  Filled 2020-04-29 (×4): qty 1

## 2020-04-29 MED ORDER — POLYETHYLENE GLYCOL 3350 17 G PO PACK
17.0000 g | PACK | Freq: Every day | ORAL | Status: DC | PRN
  Administered 2020-05-04 – 2020-05-10 (×2): 17 g
  Filled 2020-04-29 (×2): qty 1

## 2020-04-29 MED ORDER — DOCUSATE SODIUM 50 MG/5ML PO LIQD
100.0000 mg | Freq: Two times a day (BID) | ORAL | Status: DC | PRN
  Administered 2020-05-04 – 2020-05-08 (×2): 100 mg
  Filled 2020-04-29 (×2): qty 10

## 2020-04-29 NOTE — Progress Notes (Signed)
STROKE TEAM PROGRESS NOTE   INTERVAL HISTORY RN is at bedside, no neuro changes overnight. Still on IV cardizem.   OBJECTIVE Vitals:   04/29/20 0600 04/29/20 0800 04/29/20 0900 04/29/20 1000  BP: 119/84 113/78 118/85 122/79  Pulse: 86 60 68 81  Resp: 19 16 15 16   Temp:  99.4 F (37.4 C)    TempSrc:  Axillary    SpO2: 97% 98% 99% 98%  Weight:      Height:       CBC:  Recent Labs  Lab 04/26/20 0735 04/26/20 0735 04/27/20 0350 04/29/20 0452  WBC 5.9   < > 8.2 7.4  NEUTROABS 4.9  --   --   --   HGB 12.9*   < > 12.6* 12.7*  HCT 40.7   < > 38.5* 38.7*  MCV 92.5   < > 91.0 90.8  PLT 167   < > 141* 158   < > = values in this interval not displayed.   Basic Metabolic Panel:  Recent Labs  Lab 04/28/20 0044 04/28/20 0044 04/28/20 1210 04/29/20 0452  NA 137  --   --  135  K 3.7  --   --  3.5  CL 103  --   --  102  CO2 23  --   --  27  GLUCOSE 123*  --   --  162*  BUN 12  --   --  17  CREATININE 1.00  --   --  0.82  CALCIUM 8.6*  --   --  8.5*  MG 1.6*   < > 2.0 1.9  PHOS  --   --  2.8 2.7   < > = values in this interval not displayed.   Lipid Panel:     Component Value Date/Time   CHOL 125 04/26/2020 1530   TRIG 49 04/26/2020 1530   HDL 30 (L) 04/26/2020 1530   CHOLHDL 4.2 04/26/2020 1530   VLDL 10 04/26/2020 1530   LDLCALC 85 04/26/2020 1530   HgbA1c:  Lab Results  Component Value Date   HGBA1C 5.8 (H) 04/26/2020   Urine Drug Screen: No results found for: LABOPIA, COCAINSCRNUR, LABBENZ, AMPHETMU, THCU, LABBARB  Alcohol Level No results found for: [facility]  IMAGING  EEG 04/26/2020 IMPRESSION:  This study is suggestive of cortical dysfunction in left frontal region consistent with underlying ICH. There is also moderate diffuse encephalopathy, nonspecific etiology. No seizures or epileptiform discharges were seen throughout the recording.  DG Chest 1 View 04/26/2020 IMPRESSION:  Low lung volumes without evidence of acute cardiopulmonary disease.  CT HEAD  WO CONTRAST 04/26/2020 IMPRESSION:   5.8 cm left frontal intraparenchymal hemorrhage with 4 mm rightward midline shift. Multifocal left frontal hyperdensities some of which demonstrate internal fluid-fluid levels are suspicious for septic emboli.   Consider MRI head with and without contrast for further evaluation.   Small volume subdural and subarachnoid hemorrhage.   CT HEAD WO CONTRAST 04/27/2020 IMPRESSION:  Left frontal lobe intraparenchymal hemorrhages appear more confluent and more numerous as compared to the exam of 04/26/2020. However, in conglomerate the hemorrhages have not significantly changed in size encompassing an area measuring 6.6 x 2.9 x 4.8 cm. Unchanged mass effect with partial effacement of the left lateral ventricle and 4 mm rightward midline shift.  Small volume intraventricular hemorrhage within the left greater than right occipital horns, new from the prior examination. No evidence of hydrocephalus.  Unchanged scattered small volume subarachnoid hemorrhage along the bilateral cerebral hemispheres, most notably  along the anterior frontal lobes and right occipital lobe.  ECHOCARDIOGRAM COMPLETE BUBBLE STUDY 04/26/2020 IMPRESSIONS   1. Left ventricular ejection fraction, by estimation, is 55%. The left ventricle has low normal function. The left ventricle has no regional wall motion abnormalities. There is mild asymmetric left ventricular hypertrophy of the septal segment. Left ventricular diastolic parameters are indeterminate.   2. Right ventricular systolic function is mildly reduced. The right ventricular size is mildly enlarged. There is moderately elevated pulmonary artery systolic pressure. The estimated right ventricular systolic pressure is 47.3 mmHg.   3. Left atrial size was mildly dilated.   4. The mitral valve is normal in structure. Trivial mitral valve regurgitation. No evidence of mitral stenosis.   5. Tricuspid valve regurgitation is mild to moderate.    6. The aortic valve is grossly normal. There is mild calcification of the aortic valve. Aortic valve regurgitation is mild. No aortic stenosis is present.   7. Aortic dilatation noted. There is mild dilatation of the ascending aorta, measuring 42 mm.  8. The inferior vena cava is dilated in size with <50% respiratory variability, suggesting right atrial pressure of 15 mmHg.  No intracardiac source of embolism detected on this transthoracic study.   VAS [place] LOWER EXTREMITY VENOUS (DVT) 04/26/2020 Summary: BILATERAL: - No evidence of deep vein thrombosis seen in the lower extremities, bilaterally. - No evidence of superficial venous thrombosis in the lower extremities, bilaterally. -No evidence of popliteal cyst, bilaterally.   ECG - atrial fibrillation - ventricular response 125 BPM (See cardiology reading for complete details)   PHYSICAL EXAM  Temp:  [98.9 F (37.2 C)-99.7 F (37.6 C)] 99.4 F (37.4 C) (11/06 0800) Pulse Rate:  [40-114] 81 (11/06 1000) Resp:  [15-26] 16 (11/06 1000) BP: (105-151)/(67-98) 122/79 (11/06 1000) SpO2:  [92 %-99 %] 98 % (11/06 1000) Weight:  [77.4 kg] 77.4 kg (11/06 0500)  General - NAD  Ophthalmologic - fundi not visualized due to noncooperation.  Cardiovascular - irregularly irregular heart rate and rhythm.  Neuro -  eyes closed, briefly open with voice or sternal rub. However, actively against eyelid opening. Not following commands, nonverbal. With forced eye opening, eyes in left gaze position, not blinking to visual threat on the right but blinking on the left, doll's eyes not cooperative, not tracking, PERRL. Corneal reflex present, gag and cough present. Right facial droop on grimace.  Tongue protrusion not cooperative. On pain stimulation, localizing to pain on the left UE and LE, against gravity. However, RUE deltoid 1/5 and bicep 3-/5 but finger grip not cooperative. Mild withdraw at RLE with pain. Sensation, coordination and gait not  tested.   ASSESSMENT/PLAN Luke Crosby is a 79 y.o. male with history of sinus node dysfunction with pacemaker, hypertension, recent UTI, recent cellulitis, DVT, and pulmonary embolism (on eliqiuis) presenting to [facility] secondary to AMS, somnolence, non verbal state, with poor PO intake and hx of gradual decline since starting Eliquis. CT head - 5.8 cm left frontal intraparenchymal hemorrhage with 4 mm rightward midline shift, suspicious for septic emboli. Neurosurgery consulted. Eliquis reversed with vitamin K and Kcentra. He did not receive IV t-PA due to ICH.  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 Less likely septic emboli.  CT head - 5.8 cm left frontal intraparenchymal hemorrhage with 4 mm rightward midline shift. Multifocal left frontal hyperdensities some of which demonstrate internal fluid-fluid levels are suspicious for septic emboli. Small volume subdural and subarachnoid hemorrhage.   CT  Head repeat - hemorrhages have not significantly changed in size. Unchanged mass effect with partial effacement of the left lateral ventricle and 4 mm rightward midline shift.   CTA head and neck unremarkable, no mycotic aneurysm. Unchanged hematoma  MRI not able to perform due to pacer incompatible with MRI  EEG - moderate diffuse encephalopathy, nonspecific etiology.   2D Echo - EF 55%. No cardiac source of emboli identified.   [place] Corona Virus 2 - negative  LDL - 85  HgbA1c - 5.8  VTE prophylaxis - heparin subq  Eliquis (apixaban) daily prior to admission, now on No antithrombotic. Eliquis reversed with [place].   Ongoing aggressive stroke risk factor management  Therapy recommendations:  pending  Disposition:  Pending  Afib with RVR  on IV Diltiazem  Rate controlled   On Eliquis PTA  No antithrombotics now  Consider switch to cardizem per tube once HR controlled  Hx of PE and DVT  RLE DVT in 02/2020 - put on eliquis  This admission repeat  BLE Venous Dopplers - negative for DVT  Now on no antithrombotics   Heparin subq for DVT prophylaxis   Hx of recent UTI and cellulitis  Blood cultures - NGTD  Rocephin x 1 dose for suspected UTI.   UA neg. Urine culture neg  Hypertension  Home BP meds: none   On delitizem  Stable . SBP goal < 160 . Long-term BP goal normotensive  Hyperlipidemia  Home Lipid lowering medication: none  LDL 85, goal < 70  No statin at this time  May consider statin at discharge  Dysphagia   NPO   Off IVF  Placed cortrak on TF  Speech on board  Involuntary movement  Chewing movement  EEG neg  Ativan 2mg  11/3 - unchanged  Improving now  Lethargy / obtundation   Likely due to frontal ICH  Added amantadine   Other Stroke Risk Factors  Advanced age  Overweight, Body mass index is 27.54 kg/m., recommend weight loss, diet and exercise as appropriate   Other Active Problems  Code status - Full code.  Gout  Baseline independent before 02/2020, but declined since PE/DVT, however, still able to walk independently, occasional driving, coherent conversation  Hospital day # 3   Patient with ICH due to HTN on Eliquis. Patient to follow up outpatient with neurology or neurosurgery for repeat head imaging. Would hold Eliquis or anti-platelets until hemorrhage is resolved. Stroke will sign off, Dr. 03/2020 listed as primary, discussed with Dr. Franky Macho.   Personally examined patient and images, and have participated in and made any corrections needed to history, physical, neuro exam,assessment and plan as stated above.  I have personally obtained the history, evaluated lab date, reviewed imaging studies and agree with radiology interpretations.    Franky Macho, MD Stroke Neurology  I spent 35 minutes of face-to-face and non-face-to-face time with patient. This included prechart review, lab review, study review, order entry, electronic health record documentation, patient  education on the different diagnostic and therapeutic options, counseling and coordination of care, risks and benefits of management, compliance, or risk factor reduction .    To contact Stroke Continuity provider, please refer to Naomie Dean. After hours, contact General Neurology

## 2020-04-29 NOTE — Evaluation (Signed)
Physical Therapy Evaluation Patient Details Name: Luke Crosby MRN: [id number] DOB: [birth year] Today's Date: 04/29/2020   History of Present Illness  79 year old male with recent history of bilateral PE with extensive RLE DVT 02/2020 on Eliquis, UTI, PAF, sinus dysfunction s/p medtronic pacemaker, HTN, and gout who presented to [facility] on 11/3 for increasing confusion.  Imaging showed 5.8 cm Left frontal intraparenchymal hemorrhage with rightward midline shift.  Clinical Impression  Pt admitted with/for increasing confusion found to be due to L fontal hemorrhage.  Pt with no motor responses other than tracking to his name..  Pt currently limited functionally due to the problems listed. ( See problems list.)   Pt will benefit from PT to maximize function and safety in order to get ready for next venue listed below.     Follow Up Recommendations CIR;Other (comment)    Equipment Recommendations  Other (comment) (TBA)    Recommendations for Other Services       Precautions / Restrictions Precautions Precautions: Fall      Mobility  Bed Mobility Overal bed mobility: Needs Assistance Bed Mobility: Supine to Sit;Sit to Supine     Supine to sit: Total assist;+2 for physical assistance Sit to supine: Total assist;+2 for physical assistance   General bed mobility comments: no noticable attempts to assist by pt.    Transfers                 General transfer comment: not safe to attempt  Ambulation/Gait                Stairs            Wheelchair Mobility    Modified Rankin (Stroke Patients Only) Modified Rankin (Stroke Patients Only) Pre-Morbid Rankin Score: No symptoms Modified Rankin: Severe disability     Balance Overall balance assessment: Needs assistance   Sitting balance-Leahy Scale: Zero Sitting balance - Comments: pt sat EOB 5 min.  Worked on eliciting truncal reaction with positioning significantly outside BOS, or just in midline.   No  reaction noted except with pt falling slowly posteriorly with minimal abdominal activation.                                     Pertinent Vitals/Pain Pain Assessment: Faces Faces Pain Scale: No hurt Pain Intervention(s): Monitored during session    Home Living Family/patient expects to be discharged to:: Private residence Living Arrangements: Children Available Help at Discharge: Family;Available PRN/intermittently Type of Home: House Home Access: Ramped entrance     Home Layout: One level Home Equipment: Walker - 2 wheels;Cane - single point;Bedside commode Additional Comments: Obtained from the chart, 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    Prior Function Level of Independence: Independent         Comments: Tourist information centre manager, drives (obtained from the chart, 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, pt unable)     Hand Dominance        Extremity/Trunk Assessment   Upper Extremity Assessment Upper Extremity Assessment:  (no voluntary/spontaneous mvt until end of session--with L UE)    Lower Extremity Assessment Lower Extremity Assessment:  (no volitional or spontaneous movement noted.)       Communication   Communication: Other (comment) (pt non verbal on evaluation)  Cognition Arousal/Alertness: Awake/alert Behavior During Therapy: Flat affect Overall Cognitive Status: No family/caregiver present to determine baseline cognitive functioning  General Comments: pt showed no signs of understanding any commands and pt made no verbal or motor responses to commands      General Comments General comments (skin integrity, edema, etc.): vss    Exercises Other Exercises Other Exercises: Warm up PROM to all 4's with added attempt to get responses   Assessment/Plan    PT Assessment Patient needs continued PT services  PT Problem List Decreased strength;Decreased activity tolerance;Decreased balance;Decreased mobility;Decreased  coordination;Decreased cognition;Decreased safety awareness       PT Treatment Interventions Functional mobility training;Therapeutic activities;Therapeutic exercise;Balance training;Neuromuscular re-education;Patient/family education;DME instruction;Gait training    PT Goals (Current goals can be found in the Care Plan section)  Acute Rehab PT Goals PT Goal Formulation: Patient unable to participate in goal setting Time For Goal Achievement: 05/13/20 Potential to Achieve Goals: Fair    Frequency Min 3X/week   Barriers to discharge        Co-evaluation               AM-PAC PT "6 Clicks" Mobility  Outcome Measure Help needed turning from your back to your side while in a flat bed without using bedrails?: Total Help needed moving from lying on your back to sitting on the side of a flat bed without using bedrails?: Total Help needed moving to and from a bed to a chair (including a wheelchair)?: Total Help needed standing up from a chair using your arms (e.g., wheelchair or bedside chair)?: Total Help needed to walk in hospital room?: Total Help needed climbing 3-5 steps with a railing? : Total 6 Click Score: 6    End of Session   Activity Tolerance: Patient tolerated treatment well Patient left: in bed;with call bell/phone within reach;with bed alarm set;with SCD's reapplied Nurse Communication: Mobility status PT Visit Diagnosis: Other abnormalities of gait and mobility (R26.89);Other symptoms and signs involving the nervous system (R29.898)    Time: 9480-1655 PT Time Calculation (min) (ACUTE ONLY): 23 min   Charges:   PT Evaluation $PT Eval Moderate Complexity: 1 Mod PT Treatments $Neuromuscular Re-education: 8-22 mins        04/29/2020  Jacinto Halim., PT Acute Rehabilitation Services [phone]  (pager) [phone]  (office)  Luke Crosby 04/29/2020, 5:31 PM

## 2020-04-29 NOTE — Progress Notes (Addendum)
Neurosurgery Service Progress Note  Subjective: No acute events overnight   Objective: Vitals:   04/29/20 0400 04/29/20 0500 04/29/20 0600 04/29/20 0800  BP: 115/77 129/80 119/84   Pulse: 78 83 86   Resp: 16 20 19    Temp: 99.5 F (37.5 C)   99.4 F (37.4 C)  TempSrc: Axillary   Axillary  SpO2: 98% 93% 97%   Weight:  77.4 kg    Height:        Physical Exam: Eyes open spont, PERRL, L gaze deviation but possible component of neglect, not following commands, no verbal output, withdrawing on L > R, toes downgoing bilaterally  Assessment & Plan: 79 y.o. man w/ R F ICH.  Neuro: -stable exam  Cardiopulm: -dilt gtt still on, currently 10mg /h, will convert to po, wean down dilt gtt, can start w/ 60q6 po (240/24h)  FENGI: -dobhoff w/ TF running, UOP good -hyperglycemia but within appropriate limits -slowly downtrending Na, f/u RFP tomorrow -will need repeat swallow eval  Heme/ID: -no active issues  Endo/Ppx/Dispo: -SCDs, TEDs, on SQH -can transfer to stepdown once dilt gtt is off  [place] A Luke Crosby  04/29/20 10:14 AM

## 2020-04-30 ENCOUNTER — Inpatient Hospital Stay (HOSPITAL_COMMUNITY): Payer: Medicare HMO

## 2020-04-30 LAB — GLUCOSE, CAPILLARY
Glucose-Capillary: 159 mg/dL — ABNORMAL HIGH (ref 70–99)
Glucose-Capillary: 172 mg/dL — ABNORMAL HIGH (ref 70–99)
Glucose-Capillary: 156 mg/dL — ABNORMAL HIGH (ref 70–99)

## 2020-04-30 LAB — BASIC METABOLIC PANEL
Anion gap: 8 (ref 5–15)
BUN: 18 mg/dL (ref 8–23)
CO2: 29 mmol/L (ref 22–32)
Calcium: 8.8 mg/dL — ABNORMAL LOW (ref 8.9–10.3)
Chloride: 99 mmol/L (ref 98–111)
Creatinine, Ser: 0.85 mg/dL (ref 0.61–1.24)
GFR, Estimated: >60 mL/min (ref >60)
Glucose, Bld: 137 mg/dL — ABNORMAL HIGH (ref 70–99)
Potassium: 3.9 mmol/L (ref 3.5–5.1)
Sodium: 136 mmol/L (ref 135–145)

## 2020-04-30 LAB — CBC
HCT: 41.3 % (ref 39.0–52.0)
Hemoglobin: 13.7 g/dL (ref 13.0–17.0)
MCH: 29.7 pg (ref 26.0–34.0)
MCHC: 33.2 g/dL (ref 30.0–36.0)
MCV: 89.6 fL (ref 80.0–100.0)
Platelets: 166 10*3/uL (ref 150–400)
RBC: 4.61 MIL/uL (ref 4.22–5.81)
RDW: 14.1 % (ref 11.5–15.5)
WBC: 8.2 10*3/uL (ref 4.0–10.5)
nRBC: 0 % (ref 0.0–0.2)

## 2020-04-30 MED ORDER — DILTIAZEM HCL 60 MG PO TABS
90.0000 mg | ORAL_TABLET | Freq: Four times a day (QID) | ORAL | Status: DC
  Administered 2020-04-30 – 2020-05-05 (×21): 90 mg
  Filled 2020-04-30 (×6): qty 1
  Filled 2020-04-30: qty 2
  Filled 2020-04-30 (×2): qty 1
  Filled 2020-04-30: qty 2
  Filled 2020-04-30 (×12): qty 1

## 2020-04-30 NOTE — Progress Notes (Addendum)
Neurosurgery Service Progress Note  Subjective: No acute events overnight   Objective: Vitals:   04/30/20 0700 04/30/20 0800 04/30/20 0900 04/30/20 1000  BP: (!) 129/98 (!) 133/92 (!) 142/105 (!) 143/109  Pulse: 100 (!) 108 (!) 107 (!) 113  Resp: 18 (!) 21 20 14   Temp:      TempSrc:      SpO2: 100% 100% 100% 100%  Weight:      Height:        Physical Exam: Eyes open to pain, PERRL, gaze neutral, not following commands, no verbal output, withdrawing on L > R, toes downgoing bilaterally  Assessment & Plan: 79 y.o. man w/ R F ICH/IVH.  Neuro: -more somnolent today, will repeat CTH to evaluate for HCP 2/2 IVH  Cardiopulm: -dilt gtt weaned, on 60q6 po in the low 100s with good BP, will increase to 90q6  FENGI: -dobhoff w/ TF running, UOP good -hyperglycemia but within appropriate limits -Na stable -will need repeat swallow eval vs likely dobhoff vs family discussion regarding goals of care  Heme/ID: -no active issues  Endo/Ppx/Dispo: -SCDs, TEDs, on SQH -can transfer to stepdown if CTH stable  Luke Crosby  04/30/20 11:56 AM

## 2020-04-30 NOTE — Progress Notes (Signed)
Inpatient Rehab Admissions Coordinator Note:   Per PT recommendation, pt was screened for CIR candidacy by Wolfgang Phoenix, MS, CCC-SLP.  At this time we are not recommending an inpatient rehab consult.  Will follow from a distance.  Please contact me with questions.    Wolfgang Phoenix, MS, CCC-SLP Admissions Coordinator [phone] 04/30/20 12:49 PM

## 2020-05-01 LAB — CBC
HCT: 40.8 % (ref 39.0–52.0)
Hemoglobin: 13.2 g/dL (ref 13.0–17.0)
MCH: 28.9 pg (ref 26.0–34.0)
MCHC: 32.4 g/dL (ref 30.0–36.0)
MCV: 89.5 fL (ref 80.0–100.0)
Platelets: 200 10*3/uL (ref 150–400)
RBC: 4.56 MIL/uL (ref 4.22–5.81)
RDW: 13.9 % (ref 11.5–15.5)
WBC: 9.8 10*3/uL (ref 4.0–10.5)
nRBC: 0 % (ref 0.0–0.2)

## 2020-05-01 LAB — GLUCOSE, CAPILLARY
Glucose-Capillary: 130 mg/dL — ABNORMAL HIGH (ref 70–99)
Glucose-Capillary: 162 mg/dL — ABNORMAL HIGH (ref 70–99)
Glucose-Capillary: 166 mg/dL — ABNORMAL HIGH (ref 70–99)
Glucose-Capillary: 167 mg/dL — ABNORMAL HIGH (ref 70–99)
Glucose-Capillary: 168 mg/dL — ABNORMAL HIGH (ref 70–99)
Glucose-Capillary: 207 mg/dL — ABNORMAL HIGH (ref 70–99)

## 2020-05-01 LAB — BASIC METABOLIC PANEL
Anion gap: 6 (ref 5–15)
BUN: 20 mg/dL (ref 8–23)
CO2: 32 mmol/L (ref 22–32)
Calcium: 8.8 mg/dL — ABNORMAL LOW (ref 8.9–10.3)
Chloride: 96 mmol/L — ABNORMAL LOW (ref 98–111)
Creatinine, Ser: 0.91 mg/dL (ref 0.61–1.24)
GFR, Estimated: >60 mL/min (ref >60)
Glucose, Bld: 176 mg/dL — ABNORMAL HIGH (ref 70–99)
Potassium: 3.9 mmol/L (ref 3.5–5.1)
Sodium: 134 mmol/L — ABNORMAL LOW (ref 135–145)

## 2020-05-01 LAB — CULTURE, BLOOD (SINGLE)
Culture: NO GROWTH
Culture: NO GROWTH
Special Requests: ADEQUATE

## 2020-05-01 MED ORDER — ACETAMINOPHEN 160 MG/5ML PO SOLN
650.0000 mg | ORAL | Status: DC | PRN
  Administered 2020-05-01 – 2020-05-12 (×6): 650 mg
  Filled 2020-05-01 (×7): qty 20.3

## 2020-05-01 NOTE — Progress Notes (Signed)
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  Imaging showed 5.8 cm Left frontal intraparenchymal hemorrhage with rightward midline shift.    PT Comments    Pt asleep on arrival, but became lethargic/alert with stimulation.  Pt did not respond to commands or stimuli other than his name and just for a few seconds.  Emphasis on transition, sitting balance/truncal activation at EOB, sit to stand  and attempts to elicit responses to stimuli given sitting EOB.    Follow Up Recommendations  Other (comment);CIR (assuming pt improves mentation)     Equipment Recommendations  Other (comment) (TBA)    Recommendations for Other Services       Precautions / Restrictions Precautions Precautions: Fall Restrictions Weight Bearing Restrictions: No    Mobility  Bed Mobility Overal bed mobility: Needs Assistance Bed Mobility: Supine to Sit;Sit to Supine     Supine to sit: Total assist;+2 for physical assistance Sit to supine: Total assist;+2 for physical assistance   General bed mobility comments: no noticable attempts to assist by pt.  Transfers Overall transfer level: Needs assistance Equipment used: 2 person hand held assist Transfers: Sit to/from Stand Sit to Stand: Total assist;+2 physical assistance;+2 safety/equipment         General transfer comment: very minimal initiation from pt to self assist (<25%)   Ambulation/Gait                 Stairs             Wheelchair Mobility    Modified Rankin (Stroke Patients Only) Modified Rankin (Stroke Patients Only) Pre-Morbid Rankin Score: No symptoms Modified Rankin: Severe disability      Balance Overall balance assessment: Needs assistance   Sitting balance-Leahy Scale: Zero Sitting balance - Comments: sat EOB approx 10 min. working on forward flexion/stretching given increased tightness throughout.      Standing balance-Leahy Scale: Zero                              Cognition Arousal/Alertness: Awake/alert Behavior During Therapy: Flat affect Overall Cognitive Status: No family/caregiver present to determine baseline cognitive functioning                                 General Comments: pt showed no signs of understanding any commands, he does respond to name call but only maintianing focus for few seconds. noted no other attempts/responses to attempt command follow       Exercises Other Exercises Other Exercises: Warm up PROM to all 4's with added attempt to get responses    General Comments General comments (skin integrity, edema, etc.): HR up to 131 bpm seated      Pertinent Vitals/Pain Pain Assessment: Faces Faces Pain Scale: No hurt Pain Intervention(s): Monitored during session    Home Living Family/patient expects to be discharged to:: Private residence Living Arrangements: Children Available Help at Discharge: Family;Available PRN/intermittently Type of Home: House Home Access: Ramped entrance   Home Layout: One level Home Equipment: Walker - 2 wheels;Cane - single point;Bedside commode Additional Comments: Obtained from the chart, no family  available    Prior Function Level of Independence: Independent      Comments: Tourist information centre manager, drives (obtained from the chart, 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, pt unable)   PT Goals (current goals can now be found in the care plan section) Acute Rehab PT Goals Patient Stated Goal: none stated PT Goal Formulation: Patient unable to participate in goal setting Time For Goal Achievement: 05/13/20 Potential to Achieve Goals: Fair Progress towards PT goals: Not progressing toward goals  - comment (status quo, still not responding to stim other than his name)    Frequency    Min 3X/week      PT Plan Current plan remains appropriate    Co-evaluation PT/OT/SLP Co-Evaluation/Treatment: Yes Reason for Co-Treatment: Complexity of the patient's impairments (multi-system involvement);Necessary to address cognition/behavior during functional activity;For patient/therapist safety;To address functional/ADL transfers PT goals addressed during session: Mobility/safety with mobility OT goals addressed during session: ADL's and self-care      AM-PAC PT "6 Clicks" Mobility   Outcome Measure  Help needed turning from your back to your side while in a flat bed without using bedrails?: Total Help needed moving from lying on your back to sitting on the side of a flat bed without using bedrails?: Total Help needed moving to and from a bed to a chair (including a wheelchair)?: Total Help needed standing up from a chair using your arms (e.g., wheelchair or bedside chair)?: Total Help needed to walk in hospital room?: Total Help needed climbing 3-5 steps with a railing? : Total 6 Click Score: 6    End of Session   Activity Tolerance: Patient tolerated treatment well Patient left: in bed;with call bell/phone within reach;with bed alarm set;with SCD's reapplied Nurse Communication: Mobility status PT Visit Diagnosis: Other abnormalities of gait and mobility (R26.89);Other symptoms and signs involving the nervous system (R29.898)     Time: 2458-0998 PT Time Calculation (min) (ACUTE ONLY): 23 min  Charges:  $Therapeutic Activity: 8-22 mins                     05/01/2020  Jacinto Halim., PT Acute Rehabilitation Services [phone]  (pager) [phone]  (office)   Eliseo Gum Kayah Hecker 05/01/2020, 11:50 AM

## 2020-05-01 NOTE — Progress Notes (Signed)
Patient ID: Luke Crosby, male   DOB: Oct 08, 1940, 79 y.o.   MRN: [id number] BP 122/87   Pulse (!) 104   Temp 100.3 F (37.9 C) (Axillary)   Resp (!) 22   Ht 5\' 6"  (1.676 m)   Wt 77.6 kg   SpO2 97%   BMI 27.61 kg/m  Eyes open, not following commands Will move extremities Perrl No change since last week. Scan is stable. Do not foresee operative intervention.  Will need placement

## 2020-05-01 NOTE — Progress Notes (Signed)
Pt's son updated at bedside.

## 2020-05-01 NOTE — Evaluation (Signed)
Clinical/Bedside Swallow Evaluation Patient Details  Name: Luke Crosby MRN: [id number] Date of Birth: December 27, 1940  Today's Date: 05/01/2020 Time: SLP Start Time (ACUTE ONLY): 6644 SLP Stop Time (ACUTE ONLY): 0903 SLP Time Calculation (min) (ACUTE ONLY): 11 min  Past Medical History:  Past Medical History:  Diagnosis Date  . Altered mental status 05/29/2015  . DVT (deep vein thrombosis) in pregnancy   . Hypertension   . Pulmonary embolism (HCC)   . Sinus node dysfunction (HCC)    With pacemaker   Past Surgical History:  Past Surgical History:  Procedure Laterality Date  . INSERT / REPLACE / REMOVE PACEMAKER  ~ 2009   Medtronic Versa [id number]  . LAPAROSCOPIC CHOLECYSTECTOMY     HPI:  Pt is a 79 year old male with hx of PE and DVT/Afib on Eliquis who presented with progressive to [facility] for confusion and was found to have left frontal intraparenchymal hemorrhage s/p reversal with Kcentra and vitamin K. Pt had recent E. Coli UTI and hospitalization  9/27- 10/1 for gout and cellulitis. Since, he has had a progressive decline, per H&P, pt had not been eating well and stopped speaking on date of admission.  He was transferred to [facility] for further evaluation. EEG showed no electrographic seizures. CT head 11/4: Left frontal lobe intraparenchymal hemorrhages appear more confluent and more numerous as compared to the exam of 04/26/2020   Assessment / Plan / Recommendation Clinical Impression  Pt is alert but not following commands, making it difficult to challenge him with PO intake. He takes some small bites/sips with cues for awareness and attention. When he gets POs into his mouth he swallows consistently, and at first without any overt s/s of aspiration, but then he has delayed, wet coughing. Given the above, in addition to risk for aspiration there is also risk for inadequate nutrition/hydration with current cognitive-linguistic status. Would maintain NPO status but introducing ice  chips, given one at a time from staff after oral care is performed if pt is adequately alert.   SLP Visit Diagnosis: Dysphagia, unspecified (R13.10)    Aspiration Risk  Moderate aspiration risk    Diet Recommendation NPO;Ice chips PRN after oral care   Medication Administration: Via alternative means    Other  Recommendations Oral Care Recommendations: Oral care QID Other Recommendations: Have oral suction available   Follow up Recommendations  (tba)      Frequency and Duration min 2x/week  2 weeks       Prognosis Prognosis for Safe Diet Advancement: Fair Barriers to Reach Goals: Cognitive deficits      Swallow Study   General HPI: Pt is a 79 year old male with hx of PE and DVT/Afib on Eliquis who presented with progressive to [facility] for confusion and was found to have left frontal intraparenchymal hemorrhage s/p reversal with Kcentra and vitamin K. Pt had recent E. Coli UTI and hospitalization  9/27- 10/1 for gout and cellulitis. Since, he has had a progressive decline, per H&P, pt had not been eating well and stopped speaking on date of admission.  He was transferred to [facility] for further evaluation. EEG showed no electrographic seizures. CT head 11/4: Left frontal lobe intraparenchymal hemorrhages appear more confluent and more numerous as compared to the exam of 04/26/2020 Type of Study: Bedside Swallow Evaluation Previous Swallow Assessment: none in chart Diet Prior to this Study: NPO;NG Tube Temperature Spikes Noted: Yes (100.3) Respiratory Status: Room air History of Recent Intubation: No Behavior/Cognition: Alert;Doesn't  follow directions Oral Cavity Assessment:  (limited visibility, seems to have dried skin/secretions) Oral Care Completed by SLP: Recent completion by staff Oral Cavity - Dentition: Edentulous Self-Feeding Abilities: Total assist Patient Positioning: Upright in bed Baseline Vocal Quality: Normal Volitional Cough: Cognitively unable to  elicit Volitional Swallow: Unable to elicit    Oral/Motor/Sensory Function Overall Oral Motor/Sensory Function:  (not following commands for direct assessment)   Ice Chips Ice chips: Within functional limits Presentation: Spoon   Thin Liquid Thin Liquid: Impaired Presentation: Spoon;Straw Pharyngeal  Phase Impairments: Cough - Delayed    Nectar Thick Nectar Thick Liquid: Not tested   Honey Thick Honey Thick Liquid: Not tested   Puree Puree: Impaired Presentation: Spoon Pharyngeal Phase Impairments: Cough - Delayed   Solid     Solid: Not tested      Mahala Menghini., M.A. CCC-SLP Acute Rehabilitation Services Pager [phone] Office [phone]  05/01/2020,9:13 AM

## 2020-05-01 NOTE — Evaluation (Signed)
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  Imaging showed 5.8 cm Left frontal intraparenchymal hemorrhage with rightward midline shift.   Clinical Impression   This 79 y/o male presents with the above. Pt awake upon arrival, tracking/responding to name call but with no other attempts to communicate and with minimal to no command follow throughout. Pt tolerating sitting EOB approx 10 min, requiring two person assist for completion of mobility tasks and overall maxA for static balance EOB; overall he is totalA for ADL at this time. Am hopeful pt will begin to participate/engage more in following sessions - currently recommend CIR but will monitor for pt progress and adjust as needed. Will continue to follow while pt acutely admitted.     Follow Up Recommendations  CIR;Other (comment) (pending progress)    Equipment Recommendations  Other (comment) (TBD)           Precautions / Restrictions Precautions Precautions: Fall Restrictions Weight Bearing Restrictions: No      Mobility Bed Mobility Overal bed mobility: Needs Assistance Bed Mobility: Supine to Sit;Sit to Supine     Supine to sit: Total assist;+2 for physical assistance Sit to supine: Total assist;+2 for physical assistance   General bed mobility comments: no noticable attempts to assist by pt.    Transfers Overall transfer level: Needs assistance Equipment used: 2 person hand held assist Transfers: Sit to/from Stand Sit to Stand: Total assist;+2 physical assistance;+2 safety/equipment         General transfer comment: very minimal initiation from pt to self assist (<25%)     Balance  Overall balance assessment: Needs assistance   Sitting balance-Leahy Scale: Zero Sitting balance - Comments: sat EOB approx 10 min. working on forward flexion/stretching given increased tightness throughout.      Standing balance-Leahy Scale: Zero                             ADL either performed or assessed with clinical judgement   ADL Overall ADL's : Needs assistance/impaired Eating/Feeding: NPO                                     General ADL Comments: pt currently totalA for ADL at this time. attempted hand over hand to engage in familiar/basic ADL task (combing hair), pt holding onto comb but with no attempts to carry over task, somewhat resistive to OT attempting hand over hand to perform task      Vision   Vision Assessment?: Vision impaired- to be further tested in functional context Additional Comments: pt will track to auditory stimuli/name call but only maintaining focus briefly. noted L gaze preference; did not track to R visual field despite name call/auditory stimuli at his R side, tracking only to midline      Perception     Praxis      Pertinent Vitals/Pain Pain Assessment: Faces Faces Pain Scale: No hurt Pain Intervention(s): Monitored during session     Hand Dominance     Extremity/Trunk Assessment Upper Extremity Assessment Upper Extremity Assessment: Difficult to assess due to impaired cognition;Generalized  weakness;RUE deficits/detail;LUE deficits/detail RUE Deficits / Details: edemous hand and UE, no initiation/active movement noted  RUE Coordination: decreased fine motor;decreased gross motor LUE Deficits / Details: pt intermittently moving LUE but often nonpurposeful. occasionally resistive to attempts at [facility] with reduced shoulder flexion noted  LUE Coordination: decreased fine motor;decreased gross motor   Lower Extremity Assessment Lower Extremity Assessment: Defer to PT evaluation       Communication  Communication Communication: Other (comment) (pt non verbal on evaluation)   Cognition Arousal/Alertness: Awake/alert Behavior During Therapy: Flat affect Overall Cognitive Status: No family/caregiver present to determine baseline cognitive functioning                                 General Comments: pt showed no signs of understanding any commands, he does respond to name call but only maintianing focus for few seconds. noted no other attempts/responses to attempt command follow    General Comments  HR up to 131 bpm with seated activity     Exercises Exercises: Other exercises Other Exercises Other Exercises: Warm up PROM to all 4's with added attempt to get responses   Shoulder Instructions      Home Living Family/patient expects to be discharged to:: Private residence Living Arrangements: Children Available Help at Discharge: Family;Available PRN/intermittently Type of Home: House Home Access: Ramped entrance     Home Layout: One level     Bathroom Shower/Tub: Tub/shower unit     Bathroom Accessibility: Yes   Home Equipment: Walker - 2 wheels;Cane - single point;Bedside commode   Additional Comments: Obtained from the chart, 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      Prior Functioning/Environment Level of Independence: Independent        Comments: Tourist information centre manager, drives (obtained from the chart, 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, pt unable)        OT Problem List: Decreased strength;Decreased range of motion;Decreased activity tolerance;Impaired balance (sitting and/or standing);Impaired vision/perception;Decreased cognition;Decreased safety awareness;Impaired UE functional use;Decreased coordination;Decreased knowledge of precautions;Increased edema      OT Treatment/Interventions: Self-care/ADL training;Therapeutic exercise;Energy conservation;DME and/or AE instruction;Therapeutic activities;Cognitive remediation/compensation;Balance training;Visual/perceptual  remediation/compensation;Patient/family education;Neuromuscular education    OT Goals(Current goals can be found in the care plan section) Acute Rehab OT Goals Patient Stated Goal: none stated OT Goal Formulation: Patient unable to participate in goal setting Time For Goal Achievement: 05/15/20 Potential to Achieve Goals: Good  OT Frequency: Min 2X/week   Barriers to D/C:            Co-evaluation PT/OT/SLP Co-Evaluation/Treatment: Yes Reason for Co-Treatment: Complexity of the patient's impairments (multi-system involvement);Necessary to address cognition/behavior during functional activity;For patient/therapist safety;To address functional/ADL transfers   OT goals addressed during session: ADL's and self-care      AM-PAC OT "6 Clicks" Daily Activity     Outcome Measure Help from another person eating meals?: Total Help from another person taking care of personal grooming?: Total Help from another person toileting, which includes using toliet, bedpan, or urinal?: Total Help from another person bathing (including washing, rinsing, drying)?: Total Help from another person to put on and taking off regular upper body clothing?: Total Help from another person to put on and taking off regular lower body clothing?: Total 6 Click Score: 6   End of Session Nurse Communication: Mobility status  Activity Tolerance: Patient tolerated treatment well Patient left: in bed;with call bell/phone within reach;with restraints reapplied  OT Visit Diagnosis: Cognitive communication deficit (R41.841);Other abnormalities of gait and mobility (  R26.89);Other symptoms and signs involving the nervous system (R29.898)                Time: 0929-5747 OT Time Calculation (min): 23 min Charges:  OT General Charges $OT Visit: 1 Visit OT Evaluation $OT Eval Moderate Complexity: 1 Mod  Marcy Siren, OT Acute Rehabilitation Services Pager [phone] Office [phone]   Orlando Penner 05/01/2020, 11:42 AM

## 2020-05-02 LAB — BASIC METABOLIC PANEL
Anion gap: 9 (ref 5–15)
BUN: 28 mg/dL — ABNORMAL HIGH (ref 8–23)
CO2: 28 mmol/L (ref 22–32)
Calcium: 9 mg/dL (ref 8.9–10.3)
Chloride: 97 mmol/L — ABNORMAL LOW (ref 98–111)
Creatinine, Ser: 0.91 mg/dL (ref 0.61–1.24)
GFR, Estimated: >60 mL/min (ref >60)
Glucose, Bld: 154 mg/dL — ABNORMAL HIGH (ref 70–99)
Potassium: 4.1 mmol/L (ref 3.5–5.1)
Sodium: 134 mmol/L — ABNORMAL LOW (ref 135–145)

## 2020-05-02 LAB — GLUCOSE, CAPILLARY
Glucose-Capillary: 159 mg/dL — ABNORMAL HIGH (ref 70–99)
Glucose-Capillary: 166 mg/dL — ABNORMAL HIGH (ref 70–99)
Glucose-Capillary: 170 mg/dL — ABNORMAL HIGH (ref 70–99)
Glucose-Capillary: 171 mg/dL — ABNORMAL HIGH (ref 70–99)
Glucose-Capillary: 178 mg/dL — ABNORMAL HIGH (ref 70–99)
Glucose-Capillary: 180 mg/dL — ABNORMAL HIGH (ref 70–99)
Glucose-Capillary: 184 mg/dL — ABNORMAL HIGH (ref 70–99)

## 2020-05-02 LAB — CBC
HCT: 40.3 % (ref 39.0–52.0)
Hemoglobin: 13 g/dL (ref 13.0–17.0)
MCH: 29 pg (ref 26.0–34.0)
MCHC: 32.3 g/dL (ref 30.0–36.0)
MCV: 89.8 fL (ref 80.0–100.0)
Platelets: 176 10*3/uL (ref 150–400)
RBC: 4.49 MIL/uL (ref 4.22–5.81)
RDW: 14.3 % (ref 11.5–15.5)
WBC: 9.2 10*3/uL (ref 4.0–10.5)
nRBC: 0 % (ref 0.0–0.2)

## 2020-05-02 LAB — CULTURE, BLOOD (SINGLE)
Culture: NO GROWTH
Special Requests: ADEQUATE

## 2020-05-02 NOTE — Progress Notes (Signed)
Pt's son Aneta Mins states that he would like to take pt home if he is not a candidate for CIR. I will ask Case Management to talk to him about options.

## 2020-05-02 NOTE — Progress Notes (Signed)
Foley catheter placed per retention protocol after inability to urinate following I and O caths q 6 hr times 3. Provider notified.

## 2020-05-02 NOTE — Progress Notes (Signed)
Patient ID: Luke Crosby, male   DOB: 1941-01-03, 79 y.o.   MRN: [id number] BP 120/88   Pulse (!) 103   Temp 98.7 F (37.1 C) (Axillary)   Resp 16   Ht 5\' 6"  (1.676 m)   Wt 74.3 kg   SpO2 97%   BMI 26.44 kg/m  Not following commands Perrl, symmetric grimace Will move extremities Son would like to take him home if no CIR. I have no problems with that.

## 2020-05-03 DIAGNOSIS — I10 Essential (primary) hypertension: Secondary | ICD-10-CM

## 2020-05-03 DIAGNOSIS — Z7901 Long term (current) use of anticoagulants: Secondary | ICD-10-CM

## 2020-05-03 DIAGNOSIS — I2699 Other pulmonary embolism without acute cor pulmonale: Secondary | ICD-10-CM

## 2020-05-03 DIAGNOSIS — E871 Hypo-osmolality and hyponatremia: Secondary | ICD-10-CM

## 2020-05-03 DIAGNOSIS — R4182 Altered mental status, unspecified: Secondary | ICD-10-CM

## 2020-05-03 DIAGNOSIS — I480 Paroxysmal atrial fibrillation: Secondary | ICD-10-CM

## 2020-05-03 LAB — BASIC METABOLIC PANEL
Anion gap: 7 (ref 5–15)
BUN: 27 mg/dL — ABNORMAL HIGH (ref 8–23)
CO2: 29 mmol/L (ref 22–32)
Calcium: 9.1 mg/dL (ref 8.9–10.3)
Chloride: 98 mmol/L (ref 98–111)
Creatinine, Ser: 0.94 mg/dL (ref 0.61–1.24)
GFR, Estimated: >60 mL/min (ref >60)
Glucose, Bld: 154 mg/dL — ABNORMAL HIGH (ref 70–99)
Potassium: 4.4 mmol/L (ref 3.5–5.1)
Sodium: 134 mmol/L — ABNORMAL LOW (ref 135–145)

## 2020-05-03 LAB — GLUCOSE, CAPILLARY
Glucose-Capillary: 175 mg/dL — ABNORMAL HIGH (ref 70–99)
Glucose-Capillary: 175 mg/dL — ABNORMAL HIGH (ref 70–99)
Glucose-Capillary: 176 mg/dL — ABNORMAL HIGH (ref 70–99)
Glucose-Capillary: 175 mg/dL — ABNORMAL HIGH (ref 70–99)
Glucose-Capillary: 138 mg/dL — ABNORMAL HIGH (ref 70–99)
Glucose-Capillary: 159 mg/dL — ABNORMAL HIGH (ref 70–99)

## 2020-05-03 LAB — CBC
HCT: 39.6 % (ref 39.0–52.0)
Hemoglobin: 12.9 g/dL — ABNORMAL LOW (ref 13.0–17.0)
MCH: 29.5 pg (ref 26.0–34.0)
MCHC: 32.6 g/dL (ref 30.0–36.0)
MCV: 90.4 fL (ref 80.0–100.0)
Platelets: 190 10*3/uL (ref 150–400)
RBC: 4.38 MIL/uL (ref 4.22–5.81)
RDW: 14.6 % (ref 11.5–15.5)
WBC: 7.6 10*3/uL (ref 4.0–10.5)
nRBC: 0 % (ref 0.0–0.2)

## 2020-05-03 MED ORDER — BETHANECHOL CHLORIDE 10 MG PO TABS
10.0000 mg | ORAL_TABLET | Freq: Three times a day (TID) | ORAL | Status: DC
  Administered 2020-05-03: 10 mg via ORAL
  Filled 2020-05-03 (×2): qty 1

## 2020-05-03 NOTE — Progress Notes (Signed)
Rehab Admissions Coordinator Note:  Per PT and OT recommendation, this patient was screened by Cheri Rous for appropriateness for an Inpatient Acute Rehab Consult.  At this time, we will follow for improvement in command following and tolerance. If pt shows progress, will request CIR consult.   Cheri Rous 05/03/2020, 9:27 AM  I can be reached at [phone].

## 2020-05-03 NOTE — Consult Note (Addendum)
Physical Medicine and Rehabilitation Consult Reason for Consult: Altered mental status Referring Physician: Dr. Franky Machoabbell   HPI: Luke Crosby is a 79 y.o. right-handed male with history of bilateral pulmonary emboli with extensive DVT on Eliquis, PAF with pacemaker, hypertension, gout.  History taken from chart review due to cognition.  Patient lives with his son.  Reportedly independent driving prior to admission.  1 level home with ramped entrance.  Intermittent supervision provided by family.  He presented on 04/26/2020 with gradual decline.  He was recently diagnosed with pulmonary emboli in September with increasing confusion.  CT of the head showed a 5.8 cm left frontal intraparenchymal hemorrhage with a 4 mm rightward midline shift.  Multifocal left frontal hyperdensity some of which demonstrate internal fluid fluid levels suspicious for septic emboli.  CT angiogram of head and neck with no large vessel occlusion dissection or aneurysm.  His Eliquis was [place] with [place].  Lower extremity Dopplers negative for DVT.  Admission chemistries unremarkable glucose 118, hemoglobin 12.6, hemoglobin A1c 5.8.  Echocardiogram with ejection fraction of 55%, no wall abnormalities.  EEG suggestive of cortical dysfunction in left frontal region, consistent with underlying ICH.  Moderate diffuse encephalopathy.  No seizure.  Neurosurgery neurosurgery consulted, Dr. Franky Machoabbell, recommending conservative care with no surgical intervention.  He is currently n.p.o. with alternative means of nutritional support.  He was cleared to begin subcutaneous heparin for DVT prophylaxis 04/28/2020.  Therapy evaluations completed with recommendations of physical medicine rehab consult.  Review of Systems  Unable to perform ROS: Mental acuity   Past Medical History:  Diagnosis Date  . Altered mental status 05/29/2015  . DVT (deep vein thrombosis) in pregnancy   . Hypertension   . Pulmonary embolism (HCC)   . Sinus  node dysfunction (HCC)    With pacemaker   Past Surgical History:  Procedure Laterality Date  . INSERT / REPLACE / REMOVE PACEMAKER  ~ 2009   Medtronic Versa VEDro1  . LAPAROSCOPIC CHOLECYSTECTOMY     Family History  Problem Relation Age of Onset  . Hypertension Sister    Social History:  reports that he has never smoked. He has quit using smokeless tobacco.  His smokeless tobacco use included chew. He reports that he does not drink alcohol and does not use drugs. Allergies: No Known Allergies Medications Prior to Admission  Medication Sig Dispense Refill  . amoxicillin (AMOXIL) 500 MG capsule Take 500 mg by mouth 3 (three) times daily.    Marland Kitchen. apixaban (ELIQUIS) 5 MG TABS tablet Take 1 tablet (5 mg total) by mouth 2 (two) times daily. 60 tablet 2  . colchicine 0.6 MG tablet Take 1 tablet (0.6 mg total) by mouth 2 (two) times daily. 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 Transfers Overall transfer level: Needs assistance Equipment used: 2 person hand held assist Transfers: Sit to/from  Stand Sit to Stand: Total assist, +2 physical assistance, +2 safety/equipment General transfer comment: very minimal initiation from pt to self assist (<25%)       ADL: ADL Overall ADL's : Needs assistance/impaired Eating/Feeding: NPO General ADL Comments: pt currently totalA for ADL at this time. attempted hand over hand to engage in familiar/basic ADL task (combing hair), pt holding onto comb but with no attempts to carry over task, somewhat resistive to OT attempting hand over hand to perform task   Cognition: Cognition Overall Cognitive Status: No family/caregiver present to determine baseline cognitive functioning Orientation Level: Oriented to person, Disoriented to place, Disoriented to time, Disoriented to situation Cognition Arousal/Alertness: Awake/alert Behavior During Therapy: Flat affect Overall Cognitive Status: No family/caregiver present to determine baseline cognitive functioning General Comments: pt showed no signs of understanding any commands, he does respond to name call but only maintianing focus for few seconds. noted no other attempts/responses to attempt command follow   Blood pressure 126/89, pulse 89, temperature 98.8 F (37.1 C), temperature source Axillary, resp. rate 20, height 5\' 6"  (1.676 m), weight 72.8 kg, SpO2 97 %. Physical Exam Vitals reviewed.  Constitutional:      General: He is not in acute distress. HENT:     Head: Normocephalic and atraumatic.     Comments: + NG    Nose: Nose normal.  Eyes:     General:        Right eye: No discharge.        Left eye: No discharge.  Cardiovascular:     Comments: Irregularly irregular Pulmonary:     Effort: Pulmonary effort is normal. No respiratory distress.     Breath sounds: Normal breath sounds.  Abdominal:     General: Abdomen is flat. Bowel sounds are normal. There is no distension.  Musculoskeletal:     Cervical back: Normal range of motion and neck supple.     Comments: No edema or  tenderness in extremities  Skin:    General: Skin is warm and dry.  Neurological:     Mental Status: He is alert.     Comments: Alert Motor: Limited due to participation, not following any commands.  Psychiatric:     Comments: Unable to assess due to mentation/nonverbal.     Results for orders placed or performed during the hospital encounter of 04/26/20 (from the past 24 hour(s))  Glucose, capillary     Status: Abnormal   Collection Time: 05/02/20  3:48 PM  Result Value Ref Range   Glucose-Capillary 166 (H) 70 - 99 mg/dL  Glucose, capillary     Status: Abnormal   Collection Time: 05/02/20  7:30 PM  Result Value Ref Range   Glucose-Capillary 178 (H) 70 - 99 mg/dL  Glucose, capillary     Status: Abnormal   Collection Time: 05/02/20 11:13 PM  Result Value Ref Range   Glucose-Capillary 170 (H) 70 - 99 mg/dL  CBC     Status: Abnormal   Collection Time: 05/03/20  1:08 AM  Result Value Ref Range   WBC 7.6 4.0 - 10.5 K/uL   RBC 4.38 4.22 - 5.81 MIL/uL   Hemoglobin 12.9 (L) 13.0 - 17.0 g/dL   HCT 13/10/21 39 - 52 %   MCV 90.4 80.0 - 100.0 fL   MCH 29.5 26.0 - 34.0 pg   MCHC 32.6 30.0 - 36.0  g/dL   RDW 38.1 01.7 - 51.0 %   Platelets 190 150 - 400 K/uL   nRBC 0.0 0.0 - 0.2 %  Basic metabolic panel     Status: Abnormal   Collection Time: 05/03/20  1:08 AM  Result Value Ref Range   Sodium 134 (L) 135 - 145 mmol/L   Potassium 4.4 3.5 - 5.1 mmol/L   Chloride 98 98 - 111 mmol/L   CO2 29 22 - 32 mmol/L   Glucose, Bld 154 (H) 70 - 99 mg/dL   BUN 27 (H) 8 - 23 mg/dL   Creatinine, Ser 2.58 0.61 - 1.24 mg/dL   Calcium 9.1 8.9 - 52.7 mg/dL   GFR, Estimated >78 >24 mL/min   Anion gap 7 5 - 15  Glucose, capillary     Status: Abnormal   Collection Time: 05/03/20  3:14 AM  Result Value Ref Range   Glucose-Capillary 175 (H) 70 - 99 mg/dL  Glucose, capillary     Status: Abnormal   Collection Time: 05/03/20  8:03 AM  Result Value Ref Range   Glucose-Capillary 175 (H) 70 - 99 mg/dL   Glucose, capillary     Status: Abnormal   Collection Time: 05/03/20 12:27 PM  Result Value Ref Range   Glucose-Capillary 176 (H) 70 - 99 mg/dL   Comment 1 Document in Chart    No results found.  Assessment/Plan: Diagnosis: Left frontal intraparenchymal hemorrhage  Stroke: Continue secondary stroke prophylaxis and Risk Factor Modification listed below:   Blood Pressure Management:  Continue current medication with prn's with permisive HTN per primary team ?hemiparesis: fit for orthosis to prevent contractures (resting hand splint for day, wrist cock up splint at night, PRAFO, etc) if necessary. PT/OT for mobility, ADL training  Motor recovery: Fluoxetine Labs independently reviewed.  Records reviewed and summated above.  1. Does the need for close, 24 hr/day medical supervision in concert with the patient's rehab needs make it unreasonable for this patient to be served in a less intensive setting? Yes  2. Co-Morbidities requiring supervision/potential complications: bilateral pulmonary emboli with extensive DVT (continue Eliquis), PAF (monitor with increased exertion), HTN (monitor and provide prns in accordance with increased physical exertion and pain),, gout, hyponatremia (repeat labs, treat if necessary), fevers (repeat labs, cont to monitor for signs and symptoms of infection, further workup if indicated) 3. Due to bladder management, bowel management, safety, skin/wound care, disease management, medication administration, pain management and patient education, does the patient require 24 hr/day rehab nursing? Yes 4. Does the patient require coordinated care of a physician, rehab nurse, therapy disciplines of PT/OT/SLP to address physical and functional deficits in the context of the above medical diagnosis(es)? Yes Addressing deficits in the following areas: balance, endurance, locomotion, strength, transferring, bowel/bladder control, bathing, dressing, feeding, grooming, toileting,  cognition, speech, language, swallowing and psychosocial support 5. Can the patient actively participate in an intensive therapy program of at least 3 hrs of therapy per day at least 5 days per week? Potentially 6. The potential for patient to make measurable gains while on inpatient rehab is good 7. Anticipated functional outcomes upon discharge from inpatient rehab are mod assist  with PT, mod assist with OT, mod assist with SLP. 8. Estimated rehab length of stay to reach the above functional goals is: 28-33 days. 9. Anticipated discharge destination: Home 10. Overall Rehab/Functional Prognosis: good  RECOMMENDATIONS: This patient's condition is appropriate for continued rehabilitative care in the following setting: CIR if adequate caregiver support available upon discharge.  Patient has agreed to participate in recommended program. Potentially Note that insurance prior authorization may be required for reimbursement for recommended care.  Comment: Rehab Admissions Coordinator to follow up.  I have personally performed a face to face diagnostic evaluation, including, but not limited to relevant history and physical exam findings, of this patient and developed relevant assessment and plan.  Additionally, I have reviewed and concur with the physician assistant's documentation above.   Maryla Morrow, MD, ABPMR Mcarthur Rossetti Angiulli, PA-C 05/03/2020

## 2020-05-03 NOTE — Progress Notes (Signed)
Patient ID: Luke Crosby, male   DOB: 05/17/1941, 79 y.o.   MRN: [id number] BP 123/79   Pulse 91   Temp (!) 100.7 F (38.2 C) (Axillary)   Resp 19   Ht 5\' 6"  (1.676 m)   Wt 72.8 kg   SpO2 95%   BMI 25.90 kg/m  Will open eyes, not following commands Moving all extremities Awaiting placement Exam is at baseline since admission

## 2020-05-03 NOTE — Progress Notes (Signed)
Physical Therapy Treatment Patient Details Name: Luke Crosby MRN: [id number] DOB: Oct 23, 1940 Today's Date: 05/03/2020    History of Present Illness 79 year old male with recent history of bilateral PE with extensive RLE DVT 02/2020 on Eliquis, UTI, PAF, sinus dysfunction s/p medtronic pacemaker, HTN, and gout who presented to [facility] on 11/3 for increasing confusion.  Imaging showed 5.8 cm Left frontal intraparenchymal hemorrhage with rightward midline shift.    PT Comments    Pt generally low responsiveness.  Still tracks, but appears to not understand verbal cues.  Pt now progressively showing small generalized responses to mobilizing.  Emphasis on warm up ROM, transitions to sit, sitting balance with stretching and activities to elicit truncal responses, sit to stand and pivot transfer to the chair.    Follow Up Recommendations  Other (comment);CIR (assuming pt improves mentation and response)     Equipment Recommendations  Other (comment) (TBA)    Recommendations for Other Services       Precautions / Restrictions Precautions Precautions: Fall    Mobility  Bed Mobility Overal bed mobility: Needs Assistance Bed Mobility: Supine to Sit;Sit to Supine     Supine to sit: Total assist;+2 for physical assistance Sit to supine: Total assist;+2 for physical assistance   General bed mobility comments: some generalized movements as response to transtion to EOB,  not yet directed enough to assist.  Transfers Overall transfer level: Needs assistance   Transfers: Sit to/from Stand;Stand Pivot Transfers Sit to Stand: Total assist;+2 physical assistance;+2 safety/equipment;Max assist Stand pivot transfers: Max assist;Total assist;+2 physical assistance       General transfer comment: as the session progress, pt assist became incrementally more directed.  pt followed the physical direction to come forward to stand.  Once boost began his assist minimally to apright stance and time  given for automatic attempts to help pivot to chair.  Ambulation/Gait                 Stairs             Wheelchair Mobility    Modified Rankin (Stroke Patients Only) Modified Rankin (Stroke Patients Only) Pre-Morbid Rankin Score: No symptoms Modified Rankin: Severe disability     Balance Overall balance assessment: Needs assistance   Sitting balance-Leahy Scale: Poor Sitting balance - Comments: sat EOB approx 10 min. working on forward flexion/stretching given increased tightness throughout.  pt slow show minimal activation to facilitation to sit upright.     Standing balance-Leahy Scale: Poor                              Cognition Arousal/Alertness: Awake/alert Behavior During Therapy: Flat affect Overall Cognitive Status: No family/caregiver present to determine baseline cognitive functioning                                 General Comments: Pt made a couple appropriate responses verbally and reactions to treatment      Exercises Other Exercises Other Exercises: Warm up PROM to all 4's with added attempt to get responses    General Comments        Pertinent Vitals/Pain Pain Assessment: Faces Faces Pain Scale: No hurt    Home Living Family/patient expects to be discharged to:: Private residence Living Arrangements: Children Available Help at Discharge: Family;Available PRN/intermittently Type of Home: House Home Access: Ramped entrance   Home Layout: One  level Home Equipment: Walker - 2 wheels;Cane - single point;Bedside commode Additional Comments: Obtained from the chart, 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    Prior Function Level of Independence: Independent      Comments: Tourist information centre manager, drives (obtained from the chart, 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, pt unable)   PT Goals (current goals can now be found in the care plan section) Acute Rehab PT Goals Patient Stated Goal: none stated PT Goal Formulation: Patient unable to  participate in goal setting Time For Goal Achievement: 05/13/20 Potential to Achieve Goals: Fair Progress towards PT goals: Progressing toward goals    Frequency    Min 3X/week      PT Plan Current plan remains appropriate    Co-evaluation              AM-PAC PT "6 Clicks" Mobility   Outcome Measure  Help needed turning from your back to your side while in a flat bed without using bedrails?: Total Help needed moving from lying on your back to sitting on the side of a flat bed without using bedrails?: Total Help needed moving to and from a bed to a chair (including a wheelchair)?: Total Help needed standing up from a chair using your arms (e.g., wheelchair or bedside chair)?: Total Help needed to walk in hospital room?: Total Help needed climbing 3-5 steps with a railing? : Total 6 Click Score: 6    End of Session   Activity Tolerance: Patient tolerated treatment well Patient left: with call bell/phone within reach;in chair;with chair alarm set Nurse Communication: Mobility status PT Visit Diagnosis: Other abnormalities of gait and mobility (R26.89);Other symptoms and signs involving the nervous system (R29.898)     Time: 9735-3299 PT Time Calculation (min) (ACUTE ONLY): 23 min  Charges:  $Therapeutic Activity: 8-22 mins $Neuromuscular Re-education: 8-22 mins                     05/03/2020  Jacinto Halim., PT Acute Rehabilitation Services [phone]  (pager) [phone]  (office)   [name] 05/03/2020, 5:15 PM

## 2020-05-03 NOTE — Progress Notes (Signed)
  Speech Language Pathology Treatment: Dysphagia  Patient Details Name: Luke Crosby MRN: [id number] DOB: 1940/09/17 Today's Date: 05/03/2020 Time: 9326-7124 SLP Time Calculation (min) (ACUTE ONLY): 16 min  Assessment / Plan / Recommendation Clinical Impression  Pt was not as interactive with PO trials today compared to initial evaluation. He needed Max cues to initiate intake, starting with SLP siphoning a small amount of water via straw and faded to additional straw sips and bites of puree. There is more oral holding and intermittent coughing noted. Overall, he takes in very little. Would continue to try to offer a few single pieces of ice after oral care to increase use of swallow musculature and provide moisture.    HPI HPI: Pt is a 79 year old male with hx of PE and DVT/Afib on Eliquis who presented with progressive to [facility] for confusion and was found to have left frontal intraparenchymal hemorrhage s/p reversal with Kcentra and vitamin K. Pt had recent E. Coli UTI and hospitalization  9/27- 10/1 for gout and cellulitis. Since, he has had a progressive decline, per H&P, pt had not been eating well and stopped speaking on date of admission.  He was transferred to [facility] for further evaluation. EEG showed no electrographic seizures. CT head 11/4: Left frontal lobe intraparenchymal hemorrhages appear more confluent and more numerous as compared to the exam of 04/26/2020      SLP Plan  Continue with current plan of care       Recommendations  Diet recommendations: NPO Medication Administration: Via alternative means                Oral Care Recommendations: Oral care QID Follow up Recommendations: Inpatient Rehab SLP Visit Diagnosis: Dysphagia, unspecified (R13.10) Plan: Continue with current plan of care       GO                Mahala Menghini., M.A. CCC-SLP Acute Rehabilitation Services Pager [phone] Office [phone]  05/03/2020, 2:44 PM

## 2020-05-03 NOTE — TOC Initial Note (Signed)
Transition of Care [facility]) - Initial/Assessment Note    Patient Details  Name: Luke Crosby MRN: [id number] Date of Birth: 10/19/1940  [facility] CM/SW Contact:    Lorri Frederick, LCSW Phone Number: 05/03/2020, 3:34 PM  Clinical Narrative:    CSW spoke with son, Loistine Chance, regarding discharge plan. Son aware of CIR recommendation and still interested in this possibility.  Son is not interested in a SNF placement and if CIR is not an option would take pt home with [facility] services.  Son reports pt does have 24 hours care at home.  Pt has equipment at home: hospital bed, walker, wheelchair, shower chair, bedside commode.  Pt is fully vaccinated.  PCP in place.  Will continue to follow for discharge needs.                Expected Discharge Plan: Home w Home Health Services Barriers to Discharge: Continued Medical Work up   Patient Goals and CMS Choice        Expected Discharge Plan and Services Expected Discharge Plan: Home w Home Health Services     Post Acute Care Choice: Home Health Living arrangements for the past 2 months: Single Family Home                                      Prior Living Arrangements/Services Living arrangements for the past 2 months: Single Family Home Lives with:: Adult Children          Need for Family Participation in Patient Care: Yes (Comment) Care giver support system in place?: Yes (comment) (son Loistine Chance) Current home services: Other (comment) (none) Criminal Activity/Legal Involvement Pertinent to Current Situation/Hospitalization: No - Comment as needed  Activities of Daily Living      Permission Sought/Granted                  Emotional Assessment Appearance::  (assessment done by phone) Attitude/Demeanor/Rapport: Unable to Assess Affect (typically observed): Unable to Assess Orientation: : Oriented to Self Alcohol / Substance Use: Not Applicable Psych Involvement: No (comment)  Admission diagnosis:   Anticoagulated [Z79.01] Altered mental status, unspecified altered mental status type [R41.82] Intraparenchymal hematoma of brain (HCC) [S06.360A] Intraparenchymal hematoma of brain, left, without loss of consciousness, initial encounter (HCC) [S06.350A] AMS (altered mental status) [R41.82] ICH (intracerebral hemorrhage) (HCC) [I61.9] Patient Active Problem List   Diagnosis Date Noted  . Intraparenchymal hematoma of brain (HCC) 04/26/2020  . ICH (intracerebral hemorrhage) (HCC) 04/26/2020  . Left leg cellulitis 03/21/2020  . Gout 03/21/2020  . Acute deep vein thrombosis (DVT) of femoral vein of right lower extremity (HCC) 03/21/2020  . Thrombocytopenia (HCC) 03/21/2020  . Gout attack 03/21/2020  . Pulmonary embolism (HCC) 03/07/2020  . PE (pulmonary thromboembolism) (HCC) 03/06/2020  . Constipation 03/06/2020  . Essential hypertension 03/06/2020  . Hypokalemia 03/06/2020  . Elevated serum creatinine 03/06/2020  . Elevated Crosby 03/06/2020  . Serum total bilirubin elevated 03/06/2020  . Leukocytosis 03/06/2020  . UTI (urinary tract infection) 03/06/2020  . Dehydration 03/06/2020  . TIA (transient ischemic attack) 05/30/2015  . Altered mental status 05/29/2015  . Elevated blood pressure 06/03/2013  . 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 Syncope 04/26/2011  . Atrial fibrillation (HCC) 04/12/2010  . Sinoatrial node dysfunction (HCC) 04/14/2009   PCP:  Gareth Morgan, MD Pharmacy:   [id number] - [place], [place] - [address]  [place] [address] Phone: [phone] Fax: [phone]     Social Determinants of Health (SDOH) Interventions    Readmission Risk Interventions No flowsheet data found.

## 2020-05-04 LAB — GLUCOSE, CAPILLARY
Glucose-Capillary: 168 mg/dL — ABNORMAL HIGH (ref 70–99)
Glucose-Capillary: 163 mg/dL — ABNORMAL HIGH (ref 70–99)
Glucose-Capillary: 126 mg/dL — ABNORMAL HIGH (ref 70–99)
Glucose-Capillary: 151 mg/dL — ABNORMAL HIGH (ref 70–99)
Glucose-Capillary: 152 mg/dL — ABNORMAL HIGH (ref 70–99)
Glucose-Capillary: 151 mg/dL — ABNORMAL HIGH (ref 70–99)

## 2020-05-04 MED ORDER — BETHANECHOL CHLORIDE 10 MG PO TABS
10.0000 mg | ORAL_TABLET | Freq: Three times a day (TID) | ORAL | Status: DC
  Administered 2020-05-04 – 2020-05-13 (×28): 10 mg
  Filled 2020-05-04 (×28): qty 1

## 2020-05-04 MED ORDER — MAGNESIUM CITRATE PO SOLN
1.0000 | Freq: Once | ORAL | Status: AC
  Administered 2020-05-04: 1

## 2020-05-04 MED ORDER — MAGNESIUM CITRATE PO SOLN
1.0000 | Freq: Once | ORAL | Status: DC
  Filled 2020-05-04: qty 296

## 2020-05-04 NOTE — Progress Notes (Signed)
Nutrition Follow-up  DOCUMENTATION CODES:   Not applicable  INTERVENTION:   Tube feeding via Cortrak tube: Osmolite 1.2 at 65 ml/h (1560 ml per day)  Provides 1872 kcal, 87 gm protein, 1264 ml free water daily  Consider adjusting BM regimen   NUTRITION DIAGNOSIS:   Inadequate oral intake related to lethargy/confusion, inability to eat as evidenced by NPO status.  Ongoing.   GOAL:   Patient will meet greater than or equal to 90% of their needs  Met with TF.    MONITOR:   TF tolerance, Labs, Weight trends  REASON FOR ASSESSMENT:   Consult Enteral/tube feeding initiation and management  ASSESSMENT:   78 yo male admitted after hitting his head post fall with L frontal ICH. PMH includes HTN, PE/DVT  Pt being followed by CIR for rehab with plan for d/c home after.  Pt may need permanent feeding access.    11/5 cortrak placed; tip gastric  Medications and labs reviewed   Diet Order:   Diet Order            Diet NPO time specified  Diet effective now                 EDUCATION NEEDS:   Not appropriate for education at this time  Skin:  Skin Assessment: Reviewed RN Assessment  Last BM:  PTA  Height:   Ht Readings from Last 1 Encounters:  04/26/20 5' 6" (1.676 m)    Weight:   Wt Readings from Last 1 Encounters:  05/03/20 72.8 kg    Ideal Body Weight:     BMI:  Body mass index is 25.9 kg/m.  Estimated Nutritional Needs:   Kcal:  1800-2000 kcals  Protein:  85-100 g  Fluid:  >/= 1.9 L   P., RD, LDN, CNSC See AMiON for contact information   

## 2020-05-04 NOTE — Progress Notes (Signed)
Inpatient Rehabilitation Admissions Coordinator  Met patient at bedside for assessment. I have left his son a voicemail to contact me to discuss his rehab venue options. Noted son has declined SNF. I also await further progress with therapy participation before pursuing insurance approval for Cir.  Danne Baxter, RN, MSN Rehab Admissions Coordinator [phone] 05/04/2020 11:45 AM

## 2020-05-04 NOTE — Progress Notes (Signed)
Patient ID: Luke Crosby, male   DOB: Apr 30, 1941, 79 y.o.   MRN: [id number] BP 103/82 (BP Location: Right Arm)   Pulse 63   Temp 98.1 F (36.7 C) (Oral)   Resp (!) 23   Ht 5\' 6"  (1.676 m)   Wt 72.8 kg   SpO2 95%   BMI 25.90 kg/m  Speaking today, will move extremities Peerly, full eom Symmetric facies Brighter today, more responsive

## 2020-05-05 LAB — GLUCOSE, CAPILLARY
Glucose-Capillary: 113 mg/dL — ABNORMAL HIGH (ref 70–99)
Glucose-Capillary: 134 mg/dL — ABNORMAL HIGH (ref 70–99)
Glucose-Capillary: 138 mg/dL — ABNORMAL HIGH (ref 70–99)
Glucose-Capillary: 148 mg/dL — ABNORMAL HIGH (ref 70–99)
Glucose-Capillary: 159 mg/dL — ABNORMAL HIGH (ref 70–99)
Glucose-Capillary: 167 mg/dL — ABNORMAL HIGH (ref 70–99)

## 2020-05-05 MED ORDER — DILTIAZEM 12 MG/ML ORAL SUSPENSION
90.0000 mg | Freq: Four times a day (QID) | ORAL | Status: DC
  Administered 2020-05-05 – 2020-05-13 (×30): 90 mg
  Filled 2020-05-05 (×33): qty 9

## 2020-05-05 NOTE — Progress Notes (Signed)
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  Imaging showed 5.8 cm Left frontal intraparenchymal hemorrhage with rightward midline shift.    PT Comments    Pt alert, but with minimal responses to questions/commands or stimuli.  Needs multimodal cuing as Verbal commands are not effective.  Emphasis on transitions to EOB, work on sitting balance, moving into/out of midline, sit to standing and transfers to the chair.    Follow Up Recommendations  SNF;Supervision/Assistance - 24 hour     Equipment Recommendations  Other (comment) (TBD)    Recommendations for Other Services       Precautions / Restrictions Precautions Precautions: Fall    Mobility  Bed Mobility Overal bed mobility: Needs Assistance Bed Mobility: Supine to Sit     Supine to sit: Total assist;+2 for physical assistance     General bed mobility comments: Pt does not follow directional commands without addition of tactile cues.  He is generally stiff and mildly resistant to changes in position, making it difficult to come forward to upright sitting.  Transfers Overall transfer level: Needs assistance   Transfers: Sit to/from Stand;Stand Pivot Transfers Sit to Stand: Total assist;+2 physical assistance;+2 safety/equipment;Max assist Stand pivot transfers: Max assist;Total assist;+2 safety/equipment       General transfer comment: Attempted to assist forward and boost slow enough for patient to follow tactile cues but he can come out of the squat position without significant assist.  Attempted x2, once with chair for UE support.  Ambulation/Gait             General Gait Details:  unable   Stairs             Wheelchair Mobility    Modified Rankin (Stroke Patients Only) Modified Rankin (Stroke Patients Only) Pre-Morbid Rankin Score: No symptoms Modified Rankin: Severe disability     Balance Overall balance assessment: Needs assistance Sitting-balance support: Feet supported;Single extremity supported;No upper extremity supported Sitting balance-Leahy Scale: Poor Sitting balance - Comments: sat EOB approx 10 min. working on forward flexion/stretching given increased tightness throughout.  Pt eventually able to sit without assist and close min guard.  Pt will start falling posteriorly if focus withdrawn from the task.     Standing balance-Leahy Scale: Poor (to zero) Standing balance comment: total assist to come forward and boost.  He ceases to assist at about 1/2 standing, needing significantly more boost.                            Cognition Arousal/Alertness: Awake/alert Behavior During Therapy: Flat affect Overall Cognitive Status: No family/caregiver present to determine baseline cognitive functioning                                 General Comments: pt following therapist visually around the room, Localizes when his name is called, does not follow commands, vocalized potentially to question in nonsensical speech.      Exercises      General Comments General comments (skin integrity, edema, etc.): vss      Pertinent Vitals/Pain Pain Assessment: Faces Faces Pain Scale: No hurt  Pain Intervention(s): Monitored during session    Home Living     Available Help at Discharge: Family;Available 24 hours/day (son, niece and other family members can provide 24/7 assist)           Additional Comments: verified by son    Prior Function        Comments: Tourist information centre manager, drives   PT Goals (current goals can now be found in the care plan section) Acute Rehab PT Goals PT Goal Formulation: Patient unable to  participate in goal setting Time For Goal Achievement: 05/13/20 Potential to Achieve Goals: Fair Progress towards PT goals: Progressing toward goals (very slow changes, not functional in nature)    Frequency    Min 3X/week      PT Plan Discharge plan needs to be updated    Co-evaluation PT/OT/SLP Co-Evaluation/Treatment: Yes Reason for Co-Treatment: Complexity of the patient's impairments (multi-system involvement);For patient/therapist safety PT goals addressed during session: Mobility/safety with mobility        AM-PAC PT "6 Clicks" Mobility   Outcome Measure  Help needed turning from your back to your side while in a flat bed without using bedrails?: Total Help needed moving from lying on your back to sitting on the side of a flat bed without using bedrails?: Total Help needed moving to and from a bed to a chair (including a wheelchair)?: Total Help needed standing up from a chair using your arms (e.g., wheelchair or bedside chair)?: Total Help needed to walk in hospital room?: Total Help needed climbing 3-5 steps with a railing? : Total 6 Click Score: 6    End of Session   Activity Tolerance: Patient tolerated treatment well;Patient limited by fatigue Patient left: in chair;with call bell/phone within reach;with chair alarm set Nurse Communication: Mobility status PT Visit Diagnosis: Other abnormalities of gait and mobility (R26.89);Other symptoms and signs involving the nervous system (R29.898)     Time: 1962-2297 PT Time Calculation (min) (ACUTE ONLY): 26 min  Charges:  $Therapeutic Activity: 8-22 mins                     05/05/2020  Jacinto Halim., PT Acute Rehabilitation Services [phone]  (pager) [phone]  (office)   Eliseo Gum Buena Boehm 05/05/2020, 3:03 PM

## 2020-05-05 NOTE — Progress Notes (Signed)
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  Imaging showed 5.8 cm Left frontal intraparenchymal hemorrhage with rightward midline shift.   OT comments  This 79 yo male admitted with above seen today in conjunction with PT to work on sitting balance, standing, and grooming. With increased time and work on leaning forward pt was able to sit EOB with min guard A.Pt will continue to benefit from acute OT with follow OT now recommended at SNF.  Follow Up Recommendations  SNF;Supervision/Assistance - 24 hour    Equipment Recommendations  Other (comment) (TBD next venue)       Precautions / Restrictions Precautions Precautions: Fall Restrictions Weight Bearing Restrictions: No       Mobility Bed Mobility Overal bed mobility: Needs Assistance Bed Mobility: Supine to Sit     Supine to sit: Total assist;+2 for physical assistance     General bed mobility comments: Pt does not follow directional commands without addition of tactile cues.  He is generally stiff and mildly resistant to changes in position, making it difficult to come forward to upright sitting.  Transfers Overall transfer level: Needs assistance   Transfers: Sit to/from Stand;Stand Pivot Transfers Sit to Stand: Total assist;+2 physical assistance;+2 safety/equipment;Max assist Stand pivot transfers: Max assist;Total assist;+2 safety/equipment       General transfer comment: Attempted to assist forward and boost slow enough for patient to follow tactile cues but he can come out of the squat position without significant assist.  Attempted x2, once with chair for UE support.    Balance  Overall balance assessment: Needs assistance Sitting-balance support: Feet supported;Single extremity supported;No upper extremity supported Sitting balance-Leahy Scale: Poor Sitting balance - Comments: sat EOB approx 10 min. working on forward flexion/stretching given increased tightness throughout.  Pt eventually able to sit without assist and close min guard.  Pt will start falling posteriorly if focus withdrawn from the task.     Standing balance-Leahy Scale: Poor (to zero) Standing balance comment: total assist to come forward and boost.  He ceases to assist at about 1/2 standing, needing significantly more boost.                           ADL either performed or assessed with clinical judgement   ADL Overall ADL's : Needs assistance/impaired     Grooming: Wash/dry face;Total assistance;Sitting Grooming Details (indicate cue type and reason): EOB (total-min guard A sitting EOB)                                     Vision   Additional Comments: Pt with eyes closed 50% of session, when they were open he did not regard much visually          Cognition Arousal/Alertness: Awake/alert Behavior During Therapy: Flat affect Overall Cognitive Status: No family/caregiver present to determine baseline cognitive functioning                                 General Comments: pt following therapist visually around the room,  Localizes when his name is called, does not follow commands, vocalized potentially to question in nonsensical speech.              General Comments vss    Pertinent Vitals/ Pain       Pain Assessment: No/denies pain Faces Pain Scale: No hurt Pain Intervention(s): Monitored during session  Home Living     Available Help at Discharge: Family;Available 24 hours/day (son, niece and other family members can provide 24/7 assist)                   Bathroom Toilet: Standard         Additional Comments: verified by  son  Lives With: Son    Prior Functioning/Environment          Comments: Tourist information centre manager, drives   Frequency  Min 2X/week        Progress Toward Goals  OT Goals(current goals can now be found in the care plan section)  Progress towards OT goals: Progressing toward goals     Plan Discharge plan needs to be updated    Co-evaluation    PT/OT/SLP Co-Evaluation/Treatment: Yes Reason for Co-Treatment: Complexity of the patient's impairments (multi-system involvement);For patient/therapist safety;To address functional/ADL transfers PT goals addressed during session: Mobility/safety with mobility        AM-PAC OT "6 Clicks" Daily Activity     Outcome Measure   Help from another person eating meals?: Total Help from another person taking care of personal grooming?: Total Help from another person toileting, which includes using toliet, bedpan, or urinal?: Total Help from another person bathing (including washing, rinsing, drying)?: Total Help from another person to put on and taking off regular upper body clothing?: Total Help from another person to put on and taking off regular lower body clothing?: Total 6 Click Score: 6    End of Session    OT Visit Diagnosis: Cognitive communication deficit (R41.841);Other abnormalities of gait and mobility (R26.89);Other symptoms and signs involving cognitive function   Activity Tolerance Patient tolerated treatment well   Patient Left in chair;with call bell/phone within reach;with chair alarm set   Nurse Communication Mobility status;Need for lift equipment (lift pad left under patient)        Time: 9211-9417 OT Time Calculation (min): 26 min  Charges: OT General Charges $OT Visit: 1 Visit OT Treatments $Self Care/Home Management : 8-22 mins  Ignacia Palma, OTR/L Acute [employer] Pager [phone] Office [phone]      Evette Georges 05/05/2020, 3:36 PM

## 2020-05-05 NOTE — Care Management Important Message (Signed)
Important Message  Patient Details  Name: Luke Crosby MRN: [id number] Date of Birth: 1940-10-13   Medicare Important Message Given:  Yes     Dorena Bodo 05/05/2020, 3:52 PM

## 2020-05-05 NOTE — Progress Notes (Signed)
Inpatient Rehabilitation Admissions Coordinator  I spoke with pt's son by phone at [phone]. We discussed goals and expectations of a possible Cir admit. He would like to pursue CIR, not SNF. I will begin insurance authorization with [facility] medicare for a possible admit Monday pending their approval and bed availability.  Ottie Glazier, RN, MSN Rehab Admissions Coordinator [phone] 05/05/2020 11:54 AM

## 2020-05-05 NOTE — Progress Notes (Signed)
  Speech Language Pathology Treatment: Dysphagia  Patient Details Name: Luke Crosby MRN: [id number] DOB: 03/03/1941 Today's Date: 05/05/2020 Time: 3818-2993 SLP Time Calculation (min) (ACUTE ONLY): 11 min  Assessment / Plan / Recommendation Clinical Impression  Pt was awake but lethargic upon therapist's arrival.  He remained largely nonverbal throughout therapy session but did have some spontaneous verbalizations that were largely unintelligible.  Therapist facilitated the session with trials of thin liquids following oral care to continue working towards initiation of PO diet.  Pt was attentive to trials of water via teaspoon and cup sips for short periods of time but quickly fatigued.  Pt presented with no overt s/s of aspiration with trials of thin liquids via teaspoon or cup sips.  His oral phase was significant for anterior loss of boluses with cup sips of thin liquids.  At this time, pt's cognitive status continues to be a barrier precluding safe PO intake.  As a result, I continue to recommend NPO with alternative means of nutrition for now.  Pt was left in bed with bed alarm set and call bell within reach.  Continue per current plan of care.    HPI HPI: Pt is a 79 year old male with hx of PE and DVT/Afib on Eliquis who presented with progressive to [facility] for confusion and was found to have left frontal intraparenchymal hemorrhage s/p reversal with Kcentra and vitamin K. Pt had recent E. Coli UTI and hospitalization  9/27- 10/1 for gout and cellulitis. Since, he has had a progressive decline, per H&P, pt had not been eating well and stopped speaking on date of admission.  He was transferred to [facility] for further evaluation. EEG showed no electrographic seizures. CT head 11/4: Left frontal lobe intraparenchymal hemorrhages appear more confluent and more numerous as compared to the exam of 04/26/2020      SLP Plan  Continue with current plan of care       Recommendations   Diet recommendations: NPO Medication Administration: Via alternative means                Oral Care Recommendations: Oral care QID Follow up Recommendations: Inpatient Rehab SLP Visit Diagnosis: Dysphagia, unspecified (R13.10) Plan: Continue with current plan of care       GO                PageMelanee Spry 05/05/2020, 9:58 AM

## 2020-05-05 NOTE — Progress Notes (Signed)
Patient ID: Luke Crosby, male   DOB: 07-Feb-1941, 79 y.o.   MRN: [id number] BP 121/86 (BP Location: Right Arm)   Pulse 100   Temp 98.8 F (37.1 C) (Oral)   Resp (!) 22   Ht 5\' 6"  (1.676 m)   Wt 72.8 kg   SpO2 94%   BMI 25.90 kg/m  Alert, +/- following some commands Continues to mumble Moving extremities Placement being arranged

## 2020-05-05 NOTE — Progress Notes (Signed)
Inpatient Rehabilitation Admissions Coordinator  I have left voicemail yesterday and this morning for patient's son, Aneta Mins, to contact me to discuss patient's rehab options. No return call yet. I have attempted to contact Sharlette Dense, also listed as a contact, with no connection made. Until I speak with son, I am not pursuing Cir admit.  Ottie Glazier, RN, MSN Rehab Admissions Coordinator [phone] 05/05/2020 11:30 AM

## 2020-05-06 LAB — GLUCOSE, CAPILLARY
Glucose-Capillary: 152 mg/dL — ABNORMAL HIGH (ref 70–99)
Glucose-Capillary: 148 mg/dL — ABNORMAL HIGH (ref 70–99)
Glucose-Capillary: 147 mg/dL — ABNORMAL HIGH (ref 70–99)
Glucose-Capillary: 160 mg/dL — ABNORMAL HIGH (ref 70–99)
Glucose-Capillary: 149 mg/dL — ABNORMAL HIGH (ref 70–99)

## 2020-05-06 NOTE — Progress Notes (Signed)
Subjective: No acute events overnight  Objective: Vital signs in last 24 hours: Temp:  [98.8 F (37.1 C)-99.5 F (37.5 C)] 99.5 F (37.5 C) (11/13 0940) Pulse Rate:  [85-117] 99 (11/13 0940) Resp:  [14-29] 29 (11/13 0940) BP: (118-133)/(73-98) 120/98 (11/13 0940) SpO2:  [93 %-97 %] 95 % (11/13 0940) Weight:  [72.6 kg] 72.6 kg (11/13 0500)  Intake/Output from previous day: 11/12 0701 - 11/13 0700 In: 1365 [NG/GT:1365] Out: 650 [Urine:650] Intake/Output this shift: No intake/output data recorded.  No acute distress NG tube in place Eyes open spontaneously Mumbling speech Follows commands on the left, some movement, though not antigravity, on right to command  Lab Results: No results for input(s): WBC, HGB, HCT, PLT in the last 72 hours. BMET No results for input(s): NA, K, CL, CO2, GLUCOSE, BUN, CREATININE, CALCIUM in the last 72 hours.  Studies/Results: No results found.  Assessment/Plan: 79 year old man with hemorrhagic stroke -Awaiting placement   Luke Crosby 05/06/2020, 11:46 AM

## 2020-05-07 LAB — GLUCOSE, CAPILLARY
Glucose-Capillary: 146 mg/dL — ABNORMAL HIGH (ref 70–99)
Glucose-Capillary: 157 mg/dL — ABNORMAL HIGH (ref 70–99)
Glucose-Capillary: 164 mg/dL — ABNORMAL HIGH (ref 70–99)
Glucose-Capillary: 169 mg/dL — ABNORMAL HIGH (ref 70–99)
Glucose-Capillary: 175 mg/dL — ABNORMAL HIGH (ref 70–99)
Glucose-Capillary: 177 mg/dL — ABNORMAL HIGH (ref 70–99)

## 2020-05-07 NOTE — Progress Notes (Signed)
Patient ID: Luke Crosby, male   DOB: [birth year], 79 y.o.   MRN: [id number] BP 121/86 (BP Location: Right Arm)   Pulse 93   Temp 97.6 F (36.4 C)   Resp 19   Ht 5\' 6"  (1.676 m)   Wt 74.2 kg   SpO2 94%   BMI 26.40 kg/m  Alert and following some commands Mumbles Moving all extremities Awaiting placement

## 2020-05-08 ENCOUNTER — Inpatient Hospital Stay (HOSPITAL_COMMUNITY): Payer: Medicare HMO

## 2020-05-08 LAB — GLUCOSE, CAPILLARY
Glucose-Capillary: 140 mg/dL — ABNORMAL HIGH (ref 70–99)
Glucose-Capillary: 150 mg/dL — ABNORMAL HIGH (ref 70–99)
Glucose-Capillary: 152 mg/dL — ABNORMAL HIGH (ref 70–99)
Glucose-Capillary: 157 mg/dL — ABNORMAL HIGH (ref 70–99)
Glucose-Capillary: 161 mg/dL — ABNORMAL HIGH (ref 70–99)
Glucose-Capillary: 164 mg/dL — ABNORMAL HIGH (ref 70–99)
Glucose-Capillary: 174 mg/dL — ABNORMAL HIGH (ref 70–99)

## 2020-05-08 MED ORDER — LEVETIRACETAM 500 MG PO TABS
500.0000 mg | ORAL_TABLET | Freq: Two times a day (BID) | ORAL | Status: DC
  Filled 2020-05-08: qty 1

## 2020-05-08 MED ORDER — LEVETIRACETAM 100 MG/ML PO SOLN
500.0000 mg | Freq: Two times a day (BID) | ORAL | Status: DC
  Administered 2020-05-08 – 2020-05-13 (×10): 500 mg
  Filled 2020-05-08 (×9): qty 5

## 2020-05-08 NOTE — Progress Notes (Signed)
External catheters used to collect urine appears not ideal at this time.Urine output will not be recorded accurately. Will monitor for incontinence episode (s).

## 2020-05-08 NOTE — Progress Notes (Signed)
Inpatient Rehabilitation Admissions Coordinator  I await determination form [employer] of approval for CIR vs SNF.  Ottie Glazier, RN, MSN Rehab Admissions Coordinator [phone] 05/08/2020 8:36 AM

## 2020-05-08 NOTE — Progress Notes (Signed)
RN called into room by Speech Therapy. 2 SLPs said that they visualized the pt shake vigorously on the right side and that it appeared seizure-like. Pt had previously been able to state name and converse with some difficulty and is now silent and seems unable to respond at all. Pt also previously following commands this am and is not anymore. This was witnessed by 2 RNs and 2 SLPs. Pt still awake, but is still with eyes in symmetrical slits. Nail beds pressed with no response. Sternum rubbed, and pt smiled. No change in vitals at this time. [facility] was called and a message was left.  Dr. Franky Macho returned call at [phone]. He says that the pts right side shakes at baseline and seems unconcerned but ordered a routine head CT.   Robina Ade, RN

## 2020-05-08 NOTE — Progress Notes (Signed)
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  Imaging showed 5.8 cm Left frontal intraparenchymal hemorrhage with rightward midline shift.    PT Comments    The pt was unable to progress with PT goals and mobility during today's session to increased frequency and force of shaking episodes that made it unsafe to attempt OOB activity. The pt was received in bed, able to verbalize his name and deny pain, but was unable to answer any other questions or follow any commands. While supine in bed, the pt had 2 shaking episodes (one with just therapists present, and second with RN present) that started with RUE movement but progressed to large movements in all 4 extremities. Due to frequency of full-body episodes, we will defer OOB mobility to later session for safety. Due to significant level of assist needed in prior PT sessions (max-totalA of 2 for all bed mobility and OOB transfers) and decreased safety with mobility today, I continue to recommend SNF placement following d/c. However, from communication with other providers, it appears the pt's family would like him to return home, and therefore will need max HH services and DME below for safest possible transition home.     Follow Up Recommendations  SNF;Supervision/Assistance - 24 hour     Equipment Recommendations   (likely WC, hoyer lift, hospital bed if returning home)    Recommendations for Other Services       Precautions / Restrictions Precautions Precautions: Fall Precaution Comments: full-body shaking episodes (x2 during 20 min session) Restrictions Weight Bearing Restrictions: No    Mobility  Bed  Mobility Overal bed mobility: Needs Assistance Bed Mobility: Rolling Rolling: +2 for physical assistance;Total assist         General bed mobility comments: Pt does not follow commands or multimodal cues, totalA to roll for cleaning  Transfers                 General transfer comment: deferred due to safety concerns due to increased frequency of moderate-severe full-body shaking/convulsions. The episodes lasted for 10-20 seconds and occured x2 in 20 min session. Unsafe at this time to mobilize due to velocity of the pt's movement in all 4 extremities.    Modified Rankin (Stroke Patients Only) Modified Rankin (Stroke Patients Only) Pre-Morbid Rankin Score: No symptoms Modified Rankin: Severe disability        Cognition Arousal/Alertness: Awake/alert Behavior During Therapy: Flat affect Overall Cognitive Status: No family/caregiver present to determine baseline cognitive functioning                                 General Comments: Pt followed PT visually when cued. Did not follow commands during this session, answered ~20% of questions, able to state his name is Luke Crosby, and deny pain.       Exercises      General Comments General comments (skin integrity, edema, etc.): HR to 115 and pt visably with increased work of breathing with shaking episode. HR <100 with supine position in bed      Pertinent Vitals/Pain Pain Assessment: No/denies pain Faces Pain Scale: No hurt Pain Intervention(s): Monitored during session;Repositioned  PT Goals (current goals can now be found in the care plan section) Acute Rehab PT Goals Patient Stated Goal: none stated PT Goal Formulation: Patient unable to participate in goal setting Time For Goal Achievement: 05/13/20 Potential to Achieve Goals: Fair Progress towards PT goals: Not progressing toward goals - comment (unsafe to mobilize OOB due to shaking)    Frequency    Min 3X/week      PT Plan  Current plan remains appropriate       AM-PAC PT "6 Clicks" Mobility   Outcome Measure  Help needed turning from your back to your side while in a flat bed without using bedrails?: Total Help needed moving from lying on your back to sitting on the side of a flat bed without using bedrails?: Total Help needed moving to and from a bed to a chair (including a wheelchair)?: Total Help needed standing up from a chair using your arms (e.g., wheelchair or bedside chair)?: Total Help needed to walk in hospital room?: Total Help needed climbing 3-5 steps with a railing? : Total 6 Click Score: 6    End of Session   Activity Tolerance: Patient limited by fatigue;Treatment limited secondary to medical complications (Comment) (unsafe to progress, no movement initiated by pt) Patient left: in bed;with call bell/phone within reach;with bed alarm set;with nursing/sitter in room;with SCD's reapplied Nurse Communication: Mobility status PT Visit Diagnosis: Other abnormalities of gait and mobility (R26.89);Other symptoms and signs involving the nervous system (I45.809)     Time: 9833-8250 PT Time Calculation (min) (ACUTE ONLY): 29 min  Charges:  $Therapeutic Activity: 23-37 mins                     Rolm Baptise, PT, DPT   Acute Rehabilitation Department Pager #: [phone]   Gaetana Michaelis 05/08/2020, 2:16 PM

## 2020-05-08 NOTE — Progress Notes (Signed)
Patient ID: Luke Crosby, male   DOB: 01/18/1941, 79 y.o.   MRN: [id number] Head ct shows evolving hematoma. No new bleeding Has had repeated episodes of controlled/uncontrolled movement in the right lower extremity. Is speaking once more Following some commands Son would like to take him home.

## 2020-05-08 NOTE — Progress Notes (Signed)
Inpatient Rehabilitation Admissions Coordinator  I have received a denial for Cir admit from his [facility] Medicare. They will approve SNF. I contacted his son, Loistine Chance, by phone and he wishes to pursue home with Home Health . He does not want SNF. I have alerted acute team and TOC. We will sign off at this time.  Ottie Glazier, RN, MSN Rehab Admissions Coordinator [phone] 05/08/2020 10:35 AM

## 2020-05-08 NOTE — Progress Notes (Signed)
This RN spoke to Dr. Franky Macho at the end of the shift. Pt continuing to have episodes of convulsions/vigorous shaking - sometimes confined to right side, sometimes bilaterally. New orders for Keppra. Pt's son would like update.   Robina Ade, RN

## 2020-05-08 NOTE — Progress Notes (Signed)
  Speech Language Pathology Treatment: Dysphagia  Patient Details Name: Luke Crosby MRN: [id number] DOB: Mar 13, 1941 Today's Date: 05/08/2020 Time: 0850-0901 SLP Time Calculation (min) (ACUTE ONLY): 11 min  Assessment / Plan / Recommendation Clinical Impression  Pt was awake but lethargic upon arrival. His verbal expression was limited, but answered a few simple questions appropriately. Pt was observed with POs of ice chips and demonstrated decreased awareness and oral holding. He was unable to initiate a swallow and the ice chip had to be removed from the oral cavity. During the session, pt began shaking vigorously on the R side of his body, however, he remained alert and reported that he was okay. RN was immediately notified and the session was discontinued. SLP will continue to f/u with current plan of care.    HPI HPI: Pt is a 79 year old male with hx of PE and DVT/Afib on Eliquis who presented with progressive to [facility] for confusion and was found to have left frontal intraparenchymal hemorrhage s/p reversal with Kcentra and vitamin K. Pt had recent E. Coli UTI and hospitalization  9/27- 10/1 for gout and cellulitis. Since, he has had a progressive decline, per H&P, pt had not been eating well and stopped speaking on date of admission.  He was transferred to [facility] for further evaluation. EEG showed no electrographic seizures. CT head 11/4: Left frontal lobe intraparenchymal hemorrhages appear more confluent and more numerous as compared to the exam of 04/26/2020      SLP Plan  Continue with current plan of care       Recommendations  Diet recommendations: NPO Medication Administration: Via alternative means                Oral Care Recommendations: Oral care QID Follow up Recommendations: Inpatient Rehab SLP Visit Diagnosis: Dysphagia, unspecified (R13.10) Plan: Continue with current plan of care       GO                Royetta Crochet 05/08/2020, 1:22  PM

## 2020-05-09 LAB — GLUCOSE, CAPILLARY
Glucose-Capillary: 145 mg/dL — ABNORMAL HIGH (ref 70–99)
Glucose-Capillary: 181 mg/dL — ABNORMAL HIGH (ref 70–99)
Glucose-Capillary: 195 mg/dL — ABNORMAL HIGH (ref 70–99)
Glucose-Capillary: 148 mg/dL — ABNORMAL HIGH (ref 70–99)
Glucose-Capillary: 144 mg/dL — ABNORMAL HIGH (ref 70–99)
Glucose-Capillary: 175 mg/dL — ABNORMAL HIGH (ref 70–99)
Glucose-Capillary: 151 mg/dL — ABNORMAL HIGH (ref 70–99)
Glucose-Capillary: 111 mg/dL — ABNORMAL HIGH (ref 70–99)

## 2020-05-09 NOTE — Progress Notes (Signed)
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  Imaging showed 5.8 cm Left frontal intraparenchymal hemorrhage with rightward midline shift.    PT Comments    Patient progressing very slowly towards PT goals. Pt's alertness waxes and wanes despite attempts at auditory stimuli (music) to help with alertness. Requires constant stimulus to stay awake. Pt continues to be Max-total A for all mobility. Able to initiate LE there ex with max multi modal cues and repetition. Worked on trunk/thoracic extension/rotation, stretches to cervical spine and activating core. Pt with 1 episode of shaking all over especially right side of body prior to returning to supine. Pt with minimal verbalizations during session. Will continue to follow.    Follow Up Recommendations  SNF;Supervision/Assistance - 24 hour     Equipment Recommendations  Wheelchair cushion (measurements PT);Wheelchair (measurements PT);Other (comment);Hospital bed (hoyer lift and above if returning home)    Recommendations for Other Services       Precautions / Restrictions Precautions Precautions: Fall Precaution Comments: recent episodes of full-body shaking Restrictions Weight Bearing Restrictions: No    Mobility  Bed Mobility Overal bed mobility: Needs Assistance Bed Mobility: Supine to Sit;Sit to Supine     Supine to sit: Max assist;Total assist;+2 for physical assistance;+2 for safety/equipment Sit to supine: Total assist;+2 for physical assistance;+2 for safety/equipment   General bed mobility comments: hand over hand assist to initiate moving LUE towards bedrail, with cues pt grasping bedrail but  difficult maintaining grasp during transitions; assist for LEs fully over EOB And trunk elevation  Transfers                 General transfer comment: deferred  Ambulation/Gait             General Gait Details: unable   Stairs             Wheelchair Mobility    Modified Rankin (Stroke Patients Only) Modified Rankin (Stroke Patients Only) Pre-Morbid Rankin Score: No symptoms Modified Rankin: Severe disability     Balance Overall balance assessment: Needs assistance Sitting-balance support: Feet supported Sitting balance-Leahy Scale: Zero Sitting balance - Comments: max-totalA EOB. Sat EOB at least 10 min working on thoracic extensor/trunk rotation and cervical AROM. Positioned in posterior pelvic tilt. Postural control: Posterior lean                                  Cognition Arousal/Alertness: Awake/alert;Lethargic Behavior During Therapy: Flat affect Overall Cognitive Status: No family/caregiver present to determine baseline cognitive functioning                                 General Comments: waxing/waning levels of arousal. pt followed few commands (<25%), verbally responds to some questions (mostly yes, no) though inconsistently, he did verbalize "keep them on" when asked about keeping socks on end of session. pt initiating LLE movements given max multimodal cues to do so. use of auditory stimuli (music) to attempt to increase focus/engagement during session       Exercises General Exercises - Lower Extremity Long Arc Quad: AAROM;PROM;Both;5 reps;Seated  Other Exercises Other Exercises: working on cervical rotation/stretching Other Exercises: worked on trunk strengthening/stretching including flexion, extension and rotation to L/R  Other Exercises: forward reaching bil UE - AA/PROM while incorporating truncal movements     General Comments General comments (skin integrity, edema, etc.): VSS during session; pt with 1  episode of full body shaking just before returning to supine, otherwise none during EOB activities.      Pertinent Vitals/Pain Pain Assessment: Faces Faces Pain Scale: No hurt    Home Living                      Prior Function            PT Goals (current goals can now be found in the care plan section) Acute Rehab PT Goals Patient Stated Goal: none stated Progress towards PT goals: Progressing toward goals (slowly)    Frequency    Min 3X/week      PT Plan Current plan remains appropriate    Co-evaluation PT/OT/SLP Co-Evaluation/Treatment: Yes Reason for Co-Treatment: Complexity of the patient's impairments (multi-system involvement);Necessary to address cognition/behavior during functional activity;For patient/therapist safety;To address functional/ADL transfers PT goals addressed during session: Mobility/safety with mobility;Balance OT goals addressed during session: Strengthening/ROM      AM-PAC PT "6 Clicks" Mobility   Outcome Measure  Help needed turning from your back to your side while in a flat bed without using bedrails?: Total Help needed moving from lying on your back to sitting on the side of a flat bed without using bedrails?: Total Help needed moving to and from a bed to a chair (including a wheelchair)?: Total Help needed standing up from a chair using your arms (e.g., wheelchair or bedside chair)?: Total Help needed to walk in hospital room?: Total Help needed climbing 3-5 steps with a railing? : Total 6 Click Score: 6    End of Session   Activity Tolerance: Patient limited by lethargy Patient left: in bed;with call bell/phone within reach;with bed alarm set Nurse Communication: Mobility status PT Visit Diagnosis: Other abnormalities of gait and mobility (R26.89);Other symptoms and signs involving the nervous system (R29.898)     Time: 4098-1191 PT Time Calculation (min) (ACUTE ONLY): 23 min  Charges:  $Therapeutic Activity: 8-22  mins                     Vale Haven, PT, DPT Acute Rehabilitation Services Pager [phone] Office [phone]       [name] 05/09/2020, 3:36 PM

## 2020-05-09 NOTE — Progress Notes (Signed)
  Speech Language Pathology Treatment: Dysphagia  Patient Details Name: Luke Crosby MRN: [id number] DOB: [birth year] Today's Date: 05/09/2020 Time: 6789-3810 SLP Time Calculation (min) (ACUTE ONLY): 15 min  Assessment / Plan / Recommendation Clinical Impression  Pt was awake and overall more alert as compared to yesterday's session. However, he demonstrated intermittent lethargy and his level of alertness varied throughout the session. His verbal expression was increased today, but his speech was often unintelligible. Pt was observed with POs of ice chips, puree, and thin liquid. When pt was alert and accepting POs, he demonstrated increased awareness and a more timely swallow initiation with ice chips and puree. With thin liquid, he demonstrated an immediate cough response. Given pt's increased alertness and awareness of POs, instrumental testing is recommended to further assess swallow function. SLP will plan MBS for next date.   HPI HPI: Pt is a 79 year old male with hx of PE and DVT/Afib on Eliquis who presented with progressive to [facility] for confusion and was found to have left frontal intraparenchymal hemorrhage s/p reversal with Kcentra and vitamin K. Pt had recent E. Coli UTI and hospitalization  9/27- 10/1 for gout and cellulitis. Since, he has had a progressive decline, per H&P, pt had not been eating well and stopped speaking on date of admission.  He was transferred to [facility] for further evaluation. EEG showed no electrographic seizures. CT head 11/4: Left frontal lobe intraparenchymal hemorrhages appear more confluent and more numerous as compared to the exam of 04/26/2020      SLP Plan          Recommendations  Diet recommendations: NPO Medication Administration: Via alternative means                Oral Care Recommendations: Oral care QID Follow up Recommendations: Inpatient Rehab SLP Visit Diagnosis: Dysphagia, unspecified (R13.10)       GO                 Royetta Crochet 05/09/2020, 11:56 AM

## 2020-05-09 NOTE — Progress Notes (Signed)
Patient ID: Luke Crosby, male   DOB: 30-Oct-1940, 79 y.o.   MRN: [id number] BP 104/72 (BP Location: Right Arm)   Pulse 85   Temp 98.5 F (36.9 C) (Oral)   Resp (!) 23   Ht 5\' 6"  (1.676 m)   Wt 70.3 kg   SpO2 95%   BMI 25.01 kg/m  Alert, will speak, speech is not clear, but his son understands him.  Moving all extremities Has had more episodes of the right lower extremity tremors. Do not believe it is a seizure, but did start keppra yesterday.  Will be scheduled for a swallowing exam.  Son is prepared to bring him home.

## 2020-05-09 NOTE — Progress Notes (Addendum)
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  Imaging showed 5.8 cm Left frontal intraparenchymal hemorrhage with rightward midline shift.   OT comments  Pt with slow progress towards OT goals. He remains with cognitive and communication deficits; pt does demonstrate few verbalizations during session and intermittently following/initiating following basic commands (remains <25%). Much of session focus on activity seated EOB to ellicit trunk strengthening/stretching. Pt tolerating session well and with VSS. Noted x1 instance of full body shaking just right before/after returning to supine in bed. Noted per chart pt's son is wishing to take him home at time of discharge. Pt very much remains appropriate for SNF level therapies at this time. If family is to refuse SNF and take pt home recommend he receive max HH services and equipment recommended below. Acute OT to continue to follow.   Follow Up Recommendations  SNF;Supervision/Assistance - 24 hour    Equipment Recommendations  3 in 1 bedside commode;Wheelchair (measurements OT);Wheelchair cushion (measurements OT);Hospital bed;Other (comment) (hoyer)          Precautions / Restrictions Precautions Precautions: Fall Precaution Comments: recent episodes of full-body shaking Restrictions Weight Bearing Restrictions: No       Mobility Bed Mobility Overal bed mobility: Needs Assistance Bed Mobility: Supine to Sit;Sit to Supine     Supine to sit: Max assist;Total assist;+2 for physical assistance;+2 for safety/equipment Sit to supine: Total assist;+2 for physical assistance;+2 for safety/equipment   General bed  mobility comments: hand over hand assist to initiate moving LUE towards bedrail, with cues pt grasping bedrail but difficult maintaining grasp during transitions; assist for LEs fully over EOB And trunk elevation  Transfers                 General transfer comment: deferred    Balance Overall balance assessment: Needs assistance Sitting-balance support: Feet supported Sitting balance-Leahy Scale: Zero Sitting balance - Comments: max-totalA EOB. Sat EOB at least 10 min                                   ADL either performed or assessed with clinical judgement   ADL Overall ADL's : Needs assistance/impaired                                       General ADL Comments: remains totalA for ADL                       Cognition Arousal/Alertness: Awake/alert;Lethargic Behavior During Therapy: Flat affect Overall Cognitive Status: No family/caregiver present to determine baseline cognitive functioning                                 General Comments: waxing/waning levels of arousal. pt followed few commands (<25%), verbally responds to some questions (mostly yes, no) though inconsistently, he did verbalize "keep them on" when asked about keeping socks on end of session. pt initiating LLE movements given max multimodal cues to do so. use of  auditory stimuli (music) to attempt to increase focus/engagement during session         Exercises Exercises: Other exercises;General Lower Extremity;General Upper Extremity General Exercises - Lower Extremity Long Arc Quad: AAROM;PROM;Both;5 reps;Seated Other Exercises Other Exercises: working on cervical rotation/stretching Other Exercises: worked on trunk strengthening/stretching including flexion, extension and rotation to L/R  Other Exercises: forward reaching bil UE - AA/PROM while incorporating truncal movements    Shoulder Instructions       General Comments VSS seated EOB, pt with  x1 episode of fully body shaking just before returning to supine - otherwise none noted while seated EOB     Pertinent Vitals/ Pain       Pain Assessment: Faces Faces Pain Scale: No hurt  Home Living                                          Prior Functioning/Environment              Frequency  Min 2X/week        Progress Toward Goals  OT Goals(current goals can now be found in the care plan section)  Progress towards OT goals: Progressing toward goals  Acute Rehab OT Goals Patient Stated Goal: none stated OT Goal Formulation: Patient unable to participate in goal setting Time For Goal Achievement: 05/15/20 Potential to Achieve Goals: Good ADL Goals Pt Will Perform Grooming: with mod assist;sitting Additional ADL Goal #1: Pt will follow 1 step commands with 50% accuracy as precursor to ADL. Additional ADL Goal #2: Pt will maintain static balance EOB >5 min with modA as precursor to ADL. Additional ADL Goal #3: Pt will sustain attention to functional task >3 min with no more than mod multimodal cues.  Plan Discharge plan remains appropriate    Co-evaluation    PT/OT/SLP Co-Evaluation/Treatment: Yes Reason for Co-Treatment: Complexity of the patient's impairments (multi-system involvement);For patient/therapist safety;To address functional/ADL transfers;Necessary to address cognition/behavior during functional activity   OT goals addressed during session: Strengthening/ROM      AM-PAC OT "6 Clicks" Daily Activity     Outcome Measure   Help from another person eating meals?: Total Help from another person taking care of personal grooming?: Total Help from another person toileting, which includes using toliet, bedpan, or urinal?: Total Help from another person bathing (including washing, rinsing, drying)?: Total Help from another person to put on and taking off regular upper body clothing?: Total Help from another person to put on and taking off  regular lower body clothing?: Total 6 Click Score: 6    End of Session    OT Visit Diagnosis: Cognitive communication deficit (R41.841);Other abnormalities of gait and mobility (R26.89);Other symptoms and signs involving cognitive function   Activity Tolerance Patient tolerated treatment well   Patient Left in bed;with call bell/phone within reach;with bed alarm set;with restraints reapplied   Nurse Communication Mobility status        Time: 3532-9924 OT Time Calculation (min): 23 min  Charges: OT General Charges $OT Visit: 1 Visit OT Treatments $Therapeutic Activity: 8-22 mins  Marcy Siren, OT Acute Rehabilitation Services Pager [phone] Office [phone]   Orlando Penner 05/09/2020, 3:06 PM

## 2020-05-10 ENCOUNTER — Inpatient Hospital Stay (HOSPITAL_COMMUNITY): Payer: Medicare HMO

## 2020-05-10 LAB — GLUCOSE, CAPILLARY
Glucose-Capillary: 188 mg/dL — ABNORMAL HIGH (ref 70–99)
Glucose-Capillary: 136 mg/dL — ABNORMAL HIGH (ref 70–99)
Glucose-Capillary: 143 mg/dL — ABNORMAL HIGH (ref 70–99)
Glucose-Capillary: 171 mg/dL — ABNORMAL HIGH (ref 70–99)
Glucose-Capillary: 122 mg/dL — ABNORMAL HIGH (ref 70–99)
Glucose-Capillary: 161 mg/dL — ABNORMAL HIGH (ref 70–99)

## 2020-05-10 MED ORDER — BISACODYL 10 MG RE SUPP
10.0000 mg | Freq: Once | RECTAL | Status: AC
  Administered 2020-05-10: 10 mg via RECTAL
  Filled 2020-05-10: qty 1

## 2020-05-10 NOTE — Progress Notes (Signed)
Modified Barium Swallow Progress Note  Patient Details  Name: Luke Crosby MRN: [id number] Date of Birth: 06/10/1941  Today's Date: 05/10/2020  Modified Barium Swallow completed.  Full report located under Chart Review in the Imaging Section.  Brief recommendations include the following:  Clinical Impression  Pt was seen for MBS which revealed a mild oropharyngeal dysphagia characterized by delayed oral transit, premature spillage, and pharyngeal residue. Pt intermitently demonstrated delayed oral transit of POs, primarily with thin and nectar thick liquids. He also demonstrated premature spillage of liquids that fell to the level of the pyriforms before the swallow was initiated. No instances of penetration or aspiration were observed. Mild pharyngeal residue of puree was observed in the valleculae after the swallow. Recommend pt start dys 1 diet with thin liquids. Crush meds. Pt will require assist with feeding. SLP will f/u acutely for diet toleration.    Swallow Evaluation Recommendations       SLP Diet Recommendations: Dysphagia 1 (Puree) solids;Thin liquid   Liquid Administration via: Straw;Cup   Medication Administration: Crushed with puree   Supervision: Staff to assist with self feeding       Postural Changes: Seated upright at 90 degrees   Oral Care Recommendations: Oral care BID        Royetta Crochet 05/10/2020,1:00 PM

## 2020-05-10 NOTE — Progress Notes (Signed)
Pt last documented bowel movement 11/3. Pts bowel sounds hypoactive and abdomen taut. Pt passing gas. This RN gave pt PRN Miralax 11/17 0642. Pt also sustaining HR in 120's-130's. Monitor reading ST elevation. On call provider notified. Provider ordered Dulcolax suppository, CBC, BMET and advised the RN to administer Cardizem early.

## 2020-05-10 NOTE — Progress Notes (Signed)
Patient ID: Luke Crosby, male   DOB: 12-12-1940, 79 y.o.   MRN: [id number] BP 107/89 (BP Location: Right Arm)   Pulse 100   Temp 98.6 F (37 C) (Axillary)   Resp 20   Ht 5\' 6"  (1.676 m)   Wt 73 kg   SpO2 94%   BMI 25.98 kg/m  Has passed the swallow study. Dysphagia l, thin liquids.  No other change in his exam.  Son would like to take him home

## 2020-05-10 NOTE — TOC Progression Note (Signed)
Transition of Care (TOC) - Progression Note  Donn Pierini RN,BSN Transitions of Care Unit 4NP (non trauma) - RN Case Manager See Treatment Team for direct Phone #   Patient Details  Name: Luke Crosby MRN: [id number] Date of Birth: 02/07/1941  Transition of Care [facility]) CM/SW Contact  Zenda Alpers Lenn Sink, RN Phone Number: 05/10/2020, 2:04 PM  Clinical Narrative:    Insurance has denied INPT rehab admission, CM reached out to son to discuss transition plans- noted in chart son is not interested in SNF- wants to take pt home.  Per TC with son- confirmed plan is to take pt home with [facility]- son states he and his niece is going to care for pt at home. Per son they have a hospital bed, w/c, bsc, walker already in the home- son states they have all needed DME at this time. Discussed HH services- and will leave list of [facility] choices Per CMS guidelines from medicare.gov website with star ratings (copy placed in shadow chart) at the bedside for son to review when he comes to the hospital later today. CM will touch base again with son tomorrow for [facility] choice.  Also spoke about transportation home- will plan to transport via [facility]- son agreeable as pt is total care at this time.   TOC to follow.    Expected Discharge Plan: IP Rehab Facility Barriers to Discharge: Continued Medical Work up  Expected Discharge Plan and Services Expected Discharge Plan: IP Rehab Facility   Discharge Planning Services: CM Consult Post Acute Care Choice: Home Health Living arrangements for the past 2 months: Single Family Home                                       Social Determinants of Health (SDOH) Interventions    Readmission Risk Interventions No flowsheet data found.

## 2020-05-11 LAB — BASIC METABOLIC PANEL
Anion gap: 13 (ref 5–15)
BUN: 111 mg/dL — ABNORMAL HIGH (ref 8–23)
CO2: 25 mmol/L (ref 22–32)
Calcium: 10.1 mg/dL (ref 8.9–10.3)
Chloride: 108 mmol/L (ref 98–111)
Creatinine, Ser: 3.79 mg/dL — ABNORMAL HIGH (ref 0.61–1.24)
GFR, Estimated: 15 mL/min — ABNORMAL LOW (ref >60)
Glucose, Bld: 185 mg/dL — ABNORMAL HIGH (ref 70–99)
Potassium: 5.2 mmol/L — ABNORMAL HIGH (ref 3.5–5.1)
Sodium: 146 mmol/L — ABNORMAL HIGH (ref 135–145)

## 2020-05-11 LAB — CBC
HCT: 43.7 % (ref 39.0–52.0)
Hemoglobin: 14.1 g/dL (ref 13.0–17.0)
MCH: 29.6 pg (ref 26.0–34.0)
MCHC: 32.3 g/dL (ref 30.0–36.0)
MCV: 91.6 fL (ref 80.0–100.0)
Platelets: 281 10*3/uL (ref 150–400)
RBC: 4.77 MIL/uL (ref 4.22–5.81)
RDW: 15.9 % — ABNORMAL HIGH (ref 11.5–15.5)
WBC: 10.6 10*3/uL — ABNORMAL HIGH (ref 4.0–10.5)
nRBC: 0 % (ref 0.0–0.2)

## 2020-05-11 LAB — GLUCOSE, CAPILLARY
Glucose-Capillary: 126 mg/dL — ABNORMAL HIGH (ref 70–99)
Glucose-Capillary: 133 mg/dL — ABNORMAL HIGH (ref 70–99)
Glucose-Capillary: 149 mg/dL — ABNORMAL HIGH (ref 70–99)
Glucose-Capillary: 153 mg/dL — ABNORMAL HIGH (ref 70–99)
Glucose-Capillary: 170 mg/dL — ABNORMAL HIGH (ref 70–99)
Glucose-Capillary: 182 mg/dL — ABNORMAL HIGH (ref 70–99)
Glucose-Capillary: 189 mg/dL — ABNORMAL HIGH (ref 70–99)

## 2020-05-11 MED ORDER — ENSURE ENLIVE PO LIQD
237.0000 mL | Freq: Two times a day (BID) | ORAL | Status: DC
  Administered 2020-05-12 – 2020-05-13 (×2): 237 mL via ORAL

## 2020-05-11 NOTE — Progress Notes (Signed)
Nutrition Follow-up  DOCUMENTATION CODES:   Not applicable  INTERVENTION:  Provide Ensure Enlive po BID, each supplement provides 350 kcal and 20 grams of protein.  Encourage adequate PO intake.  Discontinue tube feeds as po intake has been adequate.   NUTRITION DIAGNOSIS:   Inadequate oral intake related to lethargy/confusion, inability to eat as evidenced by NPO status; ongoing  GOAL:   Patient will meet greater than or equal to 90% of their needs; progressing  MONITOR:   PO intake, Supplement acceptance, Skin, Weight trends, Labs, I & O's  REASON FOR ASSESSMENT:   Consult Enteral/tube feeding initiation and management  ASSESSMENT:   79 yo male admitted after hitting his head post fall with L frontal ICH. PMH includes HTN, PE/DVT   11/5 cortrak placed; tip gastric  Diet advanced to a dysphagia 1 diet with thin liquids. Meal completion 100% today. Per RN, pt tolerating his po at meals. Noted pt continues to have tube feeding infusing at goal rate. Pt reports fullness. As po intake has been adequate, plans to discontinue tube feeds. Discontinuation of the  Cortrak NGT to be made upon MD and care team discretion. RD to order nutritional supplements to aid in caloric and protein needs.   Labs and medications reviewed.   Diet Order:   Diet Order            DIET - DYS 1 Room service appropriate? Yes; Fluid consistency: Thin  Diet effective now                 EDUCATION NEEDS:   Not appropriate for education at this time  Skin:  Skin Assessment: Reviewed RN Assessment  Last BM:  11/17  Height:   Ht Readings from Last 1 Encounters:  04/26/20 5\' 6"  (1.676 m)    Weight:   Wt Readings from Last 1 Encounters:  05/11/20 70.5 kg   BMI:  Body mass index is 25.09 kg/m.  Estimated Nutritional Needs:   Kcal:  1800-2000 kcals  Protein:  85-100 g  Fluid:  >/= 1.9 L  05/13/20, MS, RD, LDN RD pager number/after hours weekend pager number on  Amion.

## 2020-05-11 NOTE — Progress Notes (Signed)
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  Imaging showed 5.8 cm Left frontal intraparenchymal hemorrhage with rightward midline shift.   OT comments  Patient continues to make minimal progress towards goals in skilled OT session. Patient's session encompassed attempts to complete standing and sitting balance, as well as following commands and ADLs. Pt remains total A for all ADLs, and remains unable to follow commands. Of note, after repositioning to in chair to attempt a sit<>stand pt began full body shaking episode, with HR up to 150s with RR of 37 for about 20 seconds. Due to this, and minimal command following, standing was deferred. Discharge remains appropriate, will continue to follow acutely.    Follow Up Recommendations  SNF;Supervision/Assistance - 24 hour    Equipment Recommendations  3 in 1 bedside commode;Wheelchair (measurements OT);Wheelchair cushion (measurements OT);Hospital bed;Other (comment) Michiel Sites)    Recommendations for Other Services      Precautions / Restrictions Precautions Precautions: Fall Precaution Comments: recent episodes of full-body shaking Restrictions Weight Bearing Restrictions: No       Mobility Bed Mobility Overal bed mobility: Needs Assistance             General bed mobility comments: pt OOB in recliner during session, would need totalA for bed mobiltiy as pt required totalA of 2 to pull to sit up without back resting on recliner  Transfers                 General transfer comment: deferred at this time due to episode of shaking and lack of command following    Balance Overall balance assessment:  Needs assistance Sitting-balance support: Feet supported Sitting balance-Leahy Scale: Zero Sitting balance - Comments: maxA/totalA to pull from recliner to sit unsupported then maxA to maintain  Postural control: Posterior lean                                 ADL either performed or assessed with clinical judgement   ADL Overall ADL's : Needs assistance/impaired Eating/Feeding: Total assistance;Cueing for safety;Cueing for sequencing Eating/Feeding Details (indicate cue type and reason): Attempted self feeding, unable to complete even with support from therapist                                 Functional mobility during ADLs: +2 for physical assistance;+2 for safety/equipment;Total assistance;Cueing for safety;Cueing for sequencing General ADL Comments: remains totalA for ADL     Vision       Perception     Praxis      Cognition Arousal/Alertness: Awake/alert;Lethargic Behavior During Therapy: Flat affect Overall Cognitive Status: No family/caregiver present to determine baseline cognitive functioning                                 General Comments: waxing/waning levels of arousal. pt followed few commands (<25%), through session. when he does follow, requires significant increased time and max cues        Exercises Exercises: Other exercises;General Lower Extremity;General Upper Extremity General Exercises -  Lower Extremity Long Arc Quad: PROM;Both;5 reps;Seated   Shoulder Instructions       General Comments HR 115 to 140 with sitting in recliner, high of 154 bpm during shaking episode with RR to 37.     Pertinent Vitals/ Pain       Pain Assessment: Faces Faces Pain Scale: Hurts a little bit Pain Location: generalized Pain Descriptors / Indicators: Grimacing Pain Intervention(s): Limited activity within patient's tolerance;Monitored during session  Home Living                                           Prior Functioning/Environment              Frequency  Min 2X/week        Progress Toward Goals  OT Goals(current goals can now be found in the care plan section)  Progress towards OT goals: Not progressing toward goals - comment (session limited due to full body shaking)  Acute Rehab OT Goals Patient Stated Goal: none stated OT Goal Formulation: Patient unable to participate in goal setting Time For Goal Achievement: 05/15/20 Potential to Achieve Goals: Fair  Plan Discharge plan remains appropriate    Co-evaluation      Reason for Co-Treatment: Complexity of the patient's impairments (multi-system involvement);Necessary to address cognition/behavior during functional activity;For patient/therapist safety PT goals addressed during session: Mobility/safety with mobility;Balance OT goals addressed during session: ADL's and self-care;Strengthening/ROM      AM-PAC OT "6 Clicks" Daily Activity     Outcome Measure   Help from another person eating meals?: Total Help from another person taking care of personal grooming?: Total Help from another person toileting, which includes using toliet, bedpan, or urinal?: Total Help from another person bathing (including washing, rinsing, drying)?: Total Help from another person to put on and taking off regular upper body clothing?: Total Help from another person to put on and taking off regular lower body clothing?: Total 6 Click Score: 6    End of Session    OT Visit Diagnosis: Cognitive communication deficit (R41.841);Other abnormalities of gait and mobility (R26.89);Other symptoms and signs involving cognitive function   Activity Tolerance Patient limited by lethargy;Patient limited by fatigue   Patient Left in chair;with call bell/phone within reach;with chair alarm set;with restraints reapplied (L mitt applied)   Nurse Communication Mobility status        Time: 6606-3016 OT Time Calculation (min): 30 min  Charges:  OT General Charges $OT Visit: 1 Visit OT Treatments $Self Care/Home Management : 8-22 mins  Pollyann Glen E. Sharnetta Gielow, COTA/L Acute Rehabilitation Services [phone]   Cherlyn Cushing 05/11/2020, 4:14 PM

## 2020-05-11 NOTE — Progress Notes (Signed)
Patient ID: Luke Crosby, male   DOB: May 17, 1941, 79 y.o.   MRN: [id number] BP 124/85 (BP Location: Right Arm)   Pulse (!) 108   Temp 99.2 F (37.3 C) (Axillary)   Resp 19   Ht 5\' 6"  (1.676 m)   Wt 70.5 kg   SpO2 96%   BMI 25.09 kg/m  Lethargic, mumbling still Moving extremities Ate well today

## 2020-05-11 NOTE — Progress Notes (Signed)
Pt ate nearly 100% of his breakfast, per NT. NT also stated that pt held his belly and said he was full. Pt tube feeds turned off for now. This RN just made contact with Roslyn Smiling, dietician, who confirms to leave the tube feed off for now and said she will follow up with the pt today.   Robina Ade, RN

## 2020-05-11 NOTE — Progress Notes (Addendum)
Pt was started on dys 1 diet. Pt ate 25% of lunch. Pt son came and fed him 25% of dinner. Pt tolerated well. Nt noted pt lower abd hard. Nt reported when she applied small amount of pressure pt urinated. Mayford Knife RN

## 2020-05-11 NOTE — TOC Progression Note (Signed)
Transition of Care (TOC) - Progression Note  Donn Pierini RN,BSN Transitions of Care Unit 4NP (non trauma) - RN Case Manager See Treatment Team for direct Phone #   Patient Details  Name: ALHASSAN EVERINGHAM MRN: [id number] Date of Birth: Dec 15, 1940  Transition of Care [facility]) CM/SW Contact  Zenda Alpers Lenn Sink, RN Phone Number: 05/11/2020, 3:23 PM  Clinical Narrative:    Follow up done with son today regarding HH choice- TC made to son- and per son they have selected [facility] as first choice- [facility] as backup. - confirmed with son that they do not have hoyer lift at home and would need that for home. Order placed for hoyer lift and [employer] called for DME need.  Will reach out to [facility] for [facility] referral- will need to have HH orders placed prior to discharge for [facility] needs.   Expected Discharge Plan: IP Rehab Facility Barriers to Discharge: Continued Medical Work up  Expected Discharge Plan and Services Expected Discharge Plan: IP Rehab Facility   Discharge Planning Services: CM Consult Post Acute Care Choice: Home Health Living arrangements for the past 2 months: Single Family Home                 DME Arranged: Other see comment (hoyer lift) DME Agency: [employer]                   Social Determinants of Health (SDOH) Interventions    Readmission Risk Interventions No flowsheet data found.

## 2020-05-11 NOTE — Plan of Care (Signed)
  Problem: Education: Goal: Knowledge of secondary prevention will improve Outcome: Progressing Goal: Knowledge of patient specific risk factors addressed and post discharge goals established will improve Outcome: Progressing Goal: Individualized Educational Video(s) Outcome: Progressing   Problem: Self-Care: Goal: Verbalization of feelings and concerns over difficulty with self-care will improve Outcome: Progressing Goal: Ability to communicate needs accurately will improve Outcome: Progressing   Problem: Nutrition: Goal: Dietary intake will improve Outcome: Progressing   

## 2020-05-11 NOTE — Progress Notes (Signed)
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  Imaging showed 5.8 cm Left frontal intraparenchymal hemorrhage with rightward midline shift.    PT Comments    The pt was up in recliner upon arrival of PT/OT. The pt consistently opened his eyes to auditory stimulation, but only attempted to answer a question or otherwise respond with communication twice during today's session. The pt required totalA of 2 to pull to sit unsupported at the edge of the chair, and required maxA to maintain this position until support could be provided. The pt followed one command to lift his LLE, but was unable to replicate through remainder of session. The pt did have another generalized shaking episode today with HR to 154 bpm and RR to 37, during which he started to slide out of the recliner and required totalA of 2 to reposition safely. The pt will continue to benefit from skilled PT, continue to recommend SNF for pt safety following d/c due to unpredictable movements that pose risk to the pt and his caregivers without proper training and equipment.     SNF;Supervision/Assistance - 24 hour     Equipment Recommendations  Wheelchair cushion (measurements PT);Wheelchair (measurements PT);Other (comment);Hospital bed (hoyer lift and above if going home)    Recommendations for Other Services       Precautions / Restrictions Precautions Precautions: Fall Precaution Comments: recent episodes of full-body shaking Restrictions Weight Bearing Restrictions: No    Mobility  Bed Mobility Overal bed mobility: Needs Assistance             General bed mobility comments: pt OOB in recliner  during session, would need totalA for bed mobiltiy as pt required totalA of 2 to pull to sit up without back resting on recliner  Transfers                 General transfer comment: deferred at this time due to episode of shaking and lack of command following   Modified Rankin (Stroke Patients Only) Modified Rankin (Stroke Patients Only) Pre-Morbid Rankin Score: No symptoms Modified Rankin: Severe disability     Balance Overall balance assessment: Needs assistance Sitting-balance support: Feet supported Sitting balance-Leahy Scale: Zero Sitting balance - Comments: maxA/totalA to pull from recliner to sit unsupported then maxA to maintain  Postural control: Posterior lean                                  Cognition Arousal/Alertness: Awake/alert;Lethargic Behavior During Therapy: Flat affect Overall Cognitive Status: No family/caregiver present to determine baseline cognitive functioning                                 General Comments: waxing/waning levels of arousal. pt followed few commands (<25%), through session. when he does follow, requires significant increased time and max cues      Exercises General Exercises - Lower Extremity Long Arc Quad: PROM;Both;5 reps;Seated    General Comments General comments (skin integrity, edema, etc.): HR 115 to 140 with sitting in recliner, high of 154 bpm during shaking episode with RR to  37.       Pertinent Vitals/Pain Pain Assessment: Faces Faces Pain Scale: Hurts a little bit Pain Location: generalized Pain Descriptors / Indicators: Grimacing Pain Intervention(s): Limited activity within patient's tolerance;Monitored during session           PT Goals (current goals can now be found in the care plan section) Acute Rehab PT Goals Patient Stated Goal: none stated PT Goal Formulation: Patient unable to participate in goal setting Time For Goal Achievement: 05/13/20 Potential to Achieve  Goals: Fair Progress towards PT goals: Not progressing toward goals - comment (limited by poor command following)    Frequency    Min 3X/week      PT Plan Current plan remains appropriate    Co-evaluation PT/OT/SLP Co-Evaluation/Treatment: Yes Reason for Co-Treatment: Complexity of the patient's impairments (multi-system involvement);Necessary to address cognition/behavior during functional activity;For patient/therapist safety PT goals addressed during session: Mobility/safety with mobility;Balance        AM-PAC PT "6 Clicks" Mobility   Outcome Measure  Help needed turning from your back to your side while in a flat bed without using bedrails?: Total Help needed moving from lying on your back to sitting on the side of a flat bed without using bedrails?: Total Help needed moving to and from a bed to a chair (including a wheelchair)?: Total Help needed standing up from a chair using your arms (e.g., wheelchair or bedside chair)?: Total Help needed to walk in hospital room?: Total Help needed climbing 3-5 steps with a railing? : Total 6 Click Score: 6    End of Session   Activity Tolerance: Patient limited by lethargy Patient left: in bed;with call bell/phone within reach;with bed alarm set Nurse Communication: Mobility status PT Visit Diagnosis: Other abnormalities of gait and mobility (R26.89);Other symptoms and signs involving the nervous system (O03.704)     Time: 8889-1694 PT Time Calculation (min) (ACUTE ONLY): 30 min  Charges:  $Therapeutic Activity: 8-22 mins                     Rolm Baptise, PT, DPT   Acute Rehabilitation Department Pager #: [phone]   Gaetana Michaelis 05/11/2020, 1:40 PM

## 2020-05-11 NOTE — Progress Notes (Signed)
Verbal order from Dr. Franky Macho to remove pt's coretrak, as he has been eating adequately today. Coretrak removed with no problems.   Robina Ade, RN

## 2020-05-12 LAB — GLUCOSE, CAPILLARY
Glucose-Capillary: 157 mg/dL — ABNORMAL HIGH (ref 70–99)
Glucose-Capillary: 109 mg/dL — ABNORMAL HIGH (ref 70–99)
Glucose-Capillary: 119 mg/dL — ABNORMAL HIGH (ref 70–99)
Glucose-Capillary: 122 mg/dL — ABNORMAL HIGH (ref 70–99)
Glucose-Capillary: 133 mg/dL — ABNORMAL HIGH (ref 70–99)

## 2020-05-12 NOTE — Progress Notes (Signed)
Nurse tech, Sunny Schlein, stated that pts abdomen felt firm and she pressed on his bladder while holding a urinal and got 400 mL urine out.   Robina Ade, RN

## 2020-05-12 NOTE — Progress Notes (Signed)
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  Imaging showed 5.8 cm Left frontal intraparenchymal hemorrhage with rightward midline shift.    PT Comments    The pt was lying in bed awake upon arrival of PT, alert and tracking therapist and NT movements in room. The pt did not follow any commands or attempt to assist with mobility today despite increased time and repeated multimodal cues. The pt was transferred to the recliner to allow for improved positioning and safety with feeding (totalA from NT), but the pt was too fatigued following lift transfer to allow for eating at this time. The pt will continue to benefit from skilled PT to progress functional activity tolerance for transfers and to reduce caregiver burden.   Follow Up Recommendations  SNF;Supervision/Assistance - 24 hour     Equipment Recommendations  Wheelchair cushion (measurements PT);Wheelchair (measurements PT);Other (comment);Hospital bed (hoyer lift and above if going home)    Recommendations for Other Services       Precautions / Restrictions Precautions Precautions: Fall Precaution Comments: recent episodes of full-body shaking Restrictions Weight Bearing Restrictions: No    Mobility  Bed Mobility Overal bed mobility: Needs Assistance Bed Mobility: Rolling Rolling: +2 for physical assistance;Total assist         General bed mobility comments: totalA to complete rolling in bed despite increased time and cues.   Transfers Overall transfer level: Needs assistance Equipment used: Ambulation equipment used Transfers: Stand Pivot Transfers   Stand pivot transfers: Total assist;+2  safety/equipment       General transfer comment: totalA with use of maxisky for pt and PT safety     Balance Overall balance assessment: Needs assistance Sitting-balance support: Feet supported Sitting balance-Leahy Scale: Zero Sitting balance - Comments: maxA/totalA to pull from recliner to sit unsupported then maxA to maintain  Postural control: Posterior lean                                  Cognition Arousal/Alertness: Awake/alert;Lethargic Behavior During Therapy: Flat affect Overall Cognitive Status: No family/caregiver present to determine baseline cognitive functioning                                 General Comments: waxing/waning levels of arousal. pt followed no commands in today's session. when he does follow, requires significant increased time and max cues      Exercises      General Comments General comments (skin integrity, edema, etc.): HR to 150 bpm in bed with rolling, sustaining 130s with supine and seated position      Pertinent Vitals/Pain Pain Assessment: Faces Faces Pain Scale: No hurt Pain Intervention(s): Monitored during session           PT Goals (current goals can now be found in the care plan section) Acute Rehab PT Goals Patient Stated Goal: none stated PT Goal Formulation: Patient unable to participate in goal setting Time For Goal Achievement: 05/13/20 Potential to Achieve Goals: Fair Progress towards PT goals: Not progressing toward goals - comment (no communication or command following today)  Frequency    Min 3X/week      PT Plan Current plan remains appropriate       AM-PAC PT "6 Clicks" Mobility   Outcome Measure  Help needed turning from your back to your side while in a flat bed without using bedrails?: Total Help needed moving from lying on your back to sitting on the side of a flat bed without using bedrails?: Total Help needed moving to and from a bed to a chair (including a  wheelchair)?: Total Help needed standing up from a chair using your arms (e.g., wheelchair or bedside chair)?: Total Help needed to walk in hospital room?: Total Help needed climbing 3-5 steps with a railing? : Total 6 Click Score: 6    End of Session Equipment Utilized During Treatment:  (lift) Activity Tolerance: Patient limited by lethargy Patient left: in chair;with call bell/phone within reach;with chair alarm set;with nursing/sitter in room Nurse Communication: Mobility status;Need for lift equipment PT Visit Diagnosis: Other abnormalities of gait and mobility (R26.89);Other symptoms and signs involving the nervous system (D98.338)     Time: 2505-3976 PT Time Calculation (min) (ACUTE ONLY): 16 min  Charges:  $Therapeutic Activity: 8-22 mins                     Rolm Baptise, PT, DPT   Acute Rehabilitation Department Pager #: [phone]   Gaetana Michaelis 05/12/2020, 4:34 PM

## 2020-05-12 NOTE — Progress Notes (Signed)
   05/12/20 1600  Assess: MEWS Score  Temp 99 F (37.2 C)  BP 111/89  Pulse Rate (!) 129  ECG Heart Rate (!) 126  Resp (!) 21  Level of Consciousness Alert  SpO2 95 %  O2 Device Room Air  Assess: MEWS Score  MEWS Temp 0  MEWS Systolic 0  MEWS Pulse 2  MEWS RR 1  MEWS LOC 0  MEWS Score 3  MEWS Score Color Yellow  Assess: if the MEWS score is Yellow or Red  Were vital signs taken at a resting state? Yes  Focused Assessment No change from prior assessment  Early Detection of Sepsis Score *See Row Information* Low  MEWS guidelines implemented *See Row Information* No, other (Comment) (Pt hx of a fib and in a fib rhythm causing increased HR )

## 2020-05-12 NOTE — Progress Notes (Signed)
  Speech Language Pathology Treatment: Dysphagia  Patient Details Name: Luke Crosby MRN: [id number] DOB: 1940-08-29 Today's Date: 05/12/2020 Time: 9242-6834 SLP Time Calculation (min) (ACUTE ONLY): 19 min  Assessment / Plan / Recommendation Clinical Impression  Pt was drowsy and initially not reactive to POs, but with cueing and persistent efforts, he consumed purees and thin liquids from his breakfast tray. It takes a prolonged amount of time for him to clear purees from his mouth, facilitated the most by SLP providing liquid washes. SLP still utilized the yankauer to remove a small-moderate amount of oral residue at the end of the session. Current diet remains the most appropriate. It would likely be beneficial to offer POs as much as possible throughout the day, since it does seem to take him so long to get through a meal.    HPI HPI: Pt is a 79 year old male with hx of PE and DVT/Afib on Eliquis who presented with progressive to [facility] for confusion and was found to have left frontal intraparenchymal hemorrhage s/p reversal with Kcentra and vitamin K. Pt had recent E. Coli UTI and hospitalization  9/27- 10/1 for gout and cellulitis. Since, he has had a progressive decline, per H&P, pt had not been eating well and stopped speaking on date of admission.  He was transferred to [facility] for further evaluation. EEG showed no electrographic seizures. CT head 11/4: Left frontal lobe intraparenchymal hemorrhages appear more confluent and more numerous as compared to the exam of 04/26/2020      SLP Plan  Continue with current plan of care       Recommendations  Diet recommendations: Dysphagia 1 (puree);Thin liquid Liquids provided via: Straw;Cup Medication Administration: Crushed with puree Supervision: Staff to assist with self feeding;Full supervision/cueing for compensatory strategies Compensations: Minimize environmental distractions;Slow rate;Small sips/bites Postural Changes  and/or Swallow Maneuvers: Seated upright 90 degrees                Oral Care Recommendations: Oral care BID Follow up Recommendations: Inpatient Rehab SLP Visit Diagnosis: Dysphagia, oropharyngeal phase (R13.12) Plan: Continue with current plan of care       GO                Mahala Menghini., M.A. CCC-SLP Acute Rehabilitation Services Pager [phone] Office [phone]  05/12/2020, 12:21 PM

## 2020-05-12 NOTE — Discharge Summary (Signed)
  Physician Discharge Summary  Patient ID: Luke Crosby MRN: [id number] DOB/AGE: Apr 16, 1941 79 y.o.  Admit date: 04/26/2020 Discharge date: 05/12/2020  Admission Diagnoses:Intracerebral hematoma  Discharge Diagnoses: same Active Problems:   Intraparenchymal hematoma of brain (HCC)   ICH (intracerebral hemorrhage) (HCC)   Anticoagulated   PAF (paroxysmal atrial fibrillation) (HCC)   Hyponatremia   Discharged Condition: fair  Hospital Course: Mr. Mandigo was admitted for a left frontal intracerebral hematoma. I did not need to operate, I did discontinue his anticoagulation(hx of atrial fibrillation). He remained stable, would occasionally follow commands, level of consciousness would wax and wane. He will be discharged home to his son. Insurance would not cover a stay in inpatient rehabilitation.at discharge he is lethargic. Will mumble, will move his extremities. He was eating at discharge.  Treatments: observation  Discharge Exam: Blood pressure 111/89, pulse (!) 129, temperature 99 F (37.2 C), temperature source Axillary, resp. rate (!) 21, height 5\' 6"  (1.676 m), weight 70.5 kg, SpO2 95 %. General appearance: appears older than stated age  Disposition:  * No surgery found *    Follow-up Information    [facility] Follow up.   Why: hoyer lift arranged- to be delivered to the home Contact information: [address] [place] [phone]        Care, [facility] Follow up.   Specialty: Home Health Services Why: HHPT/OT/aide arranged- they will contact you post discharge to set up home visits Contact information: [address] [place] [phone]               Signed: [phone] 05/12/2020, 7:44 PM

## 2020-05-12 NOTE — TOC Progression Note (Signed)
Transition of Care (TOC) - Progression Note  Donn Pierini RN,BSN Transitions of Care Unit 4NP (non trauma) - RN Case Manager See Treatment Team for direct Phone #   Patient Details  Name: Luke Crosby MRN: [id number] Date of Birth: 12/20/1940  Transition of Care [facility]) CM/SW Contact  Zenda Alpers, Lenn Sink, RN Phone Number: 05/12/2020, 1:12 PM  Clinical Narrative:    Noted HH orders have been placed PT/OT/aide- call made to Kensey with [facility] for [facility] referral per son's choice- per Kensey they are unable to accept in the [place] area at this time due to staffing- call then made to alternate choice [facility]- spoke with Kandee Keen who is able to accept referral for needed services- [facility]OT[facility] will follow for start of care.   Transition pending medical clearance per MD   Expected Discharge Plan: IP Rehab Facility Barriers to Discharge: Continued Medical Work up  Expected Discharge Plan and Services Expected Discharge Plan: IP Rehab Facility   Discharge Planning Services: CM Consult Post Acute Care Choice: Home Health Living arrangements for the past 2 months: Single Family Home                 DME Arranged: Other see comment (hoyer lift) DME Agency: [employer] Date DME Agency Contacted: 05/11/20 Time DME Agency Contacted: [phone] Representative spoke with at DME Agency: [facility] Arranged: PT, OT, Nurse's Aide HH Agency: [facility] Date [facility] Agency Contacted: 05/12/20 Time HH Agency Contacted: [phone] Representative spoke with at [facility] Agency: Kandee Keen   Social Determinants of Health (SDOH) Interventions    Readmission Risk Interventions No flowsheet data found.

## 2020-05-13 LAB — GLUCOSE, CAPILLARY
Glucose-Capillary: 133 mg/dL — ABNORMAL HIGH (ref 70–99)
Glucose-Capillary: 135 mg/dL — ABNORMAL HIGH (ref 70–99)
Glucose-Capillary: 146 mg/dL — ABNORMAL HIGH (ref 70–99)

## 2020-05-13 NOTE — Progress Notes (Signed)
   05/12/20 2112  Assess: MEWS Score  Temp (!) 101.2 F (38.4 C)  BP 119/70  Pulse Rate (!) 130  ECG Heart Rate (!) 128  Resp 20  SpO2 95 %  O2 Device Room Air  Assess: MEWS Score  MEWS Temp 1  MEWS Systolic 0  MEWS Pulse 2  MEWS RR 0  MEWS LOC 0  MEWS Score 3  MEWS Score Color Yellow  Assess: if the MEWS score is Yellow or Red  Were vital signs taken at a resting state? Yes  Focused Assessment No change from prior assessment  Early Detection of Sepsis Score *See Row Information* Medium  MEWS guidelines implemented *See Row Information* No, previously yellow, continue vital signs every 4 hours

## 2020-05-14 ENCOUNTER — Emergency Department (HOSPITAL_COMMUNITY): Payer: Medicare HMO

## 2020-05-14 ENCOUNTER — Inpatient Hospital Stay (HOSPITAL_COMMUNITY)
Admission: EM | Admit: 2020-05-14 | Discharge: 2020-05-20 | DRG: 871 | Disposition: A | Discharge status: Hospice | Payer: Medicare HMO | Attending: Family Medicine | Admitting: Family Medicine

## 2020-05-14 ENCOUNTER — Other Ambulatory Visit: Payer: Self-pay

## 2020-05-14 ENCOUNTER — Encounter (HOSPITAL_COMMUNITY): Payer: Self-pay | Admitting: Emergency Medicine

## 2020-05-14 DIAGNOSIS — E872 Acidosis, unspecified: Secondary | ICD-10-CM | POA: Diagnosis present

## 2020-05-14 DIAGNOSIS — B952 Enterococcus as the cause of diseases classified elsewhere: Secondary | ICD-10-CM | POA: Diagnosis present

## 2020-05-14 DIAGNOSIS — Z66 Do not resuscitate: Secondary | ICD-10-CM | POA: Diagnosis present

## 2020-05-14 DIAGNOSIS — R778 Other specified abnormalities of plasma proteins: Secondary | ICD-10-CM | POA: Diagnosis present

## 2020-05-14 DIAGNOSIS — I248 Other forms of acute ischemic heart disease: Secondary | ICD-10-CM | POA: Diagnosis present

## 2020-05-14 DIAGNOSIS — I10 Essential (primary) hypertension: Secondary | ICD-10-CM | POA: Diagnosis present

## 2020-05-14 DIAGNOSIS — R945 Abnormal results of liver function studies: Secondary | ICD-10-CM | POA: Diagnosis present

## 2020-05-14 DIAGNOSIS — A419 Sepsis, unspecified organism: Secondary | ICD-10-CM | POA: Diagnosis present

## 2020-05-14 DIAGNOSIS — Z20822 Contact with and (suspected) exposure to covid-19: Secondary | ICD-10-CM | POA: Diagnosis present

## 2020-05-14 DIAGNOSIS — M109 Gout, unspecified: Secondary | ICD-10-CM | POA: Diagnosis present

## 2020-05-14 DIAGNOSIS — Z79899 Other long term (current) drug therapy: Secondary | ICD-10-CM | POA: Diagnosis not present

## 2020-05-14 DIAGNOSIS — E870 Hyperosmolality and hypernatremia: Secondary | ICD-10-CM | POA: Diagnosis present

## 2020-05-14 DIAGNOSIS — E860 Dehydration: Secondary | ICD-10-CM | POA: Diagnosis present

## 2020-05-14 DIAGNOSIS — Z87891 Personal history of nicotine dependence: Secondary | ICD-10-CM

## 2020-05-14 DIAGNOSIS — Z8249 Family history of ischemic heart disease and other diseases of the circulatory system: Secondary | ICD-10-CM

## 2020-05-14 DIAGNOSIS — G9341 Metabolic encephalopathy: Secondary | ICD-10-CM | POA: Diagnosis present

## 2020-05-14 DIAGNOSIS — I495 Sick sinus syndrome: Secondary | ICD-10-CM | POA: Diagnosis present

## 2020-05-14 DIAGNOSIS — I611 Nontraumatic intracerebral hemorrhage in hemisphere, cortical: Secondary | ICD-10-CM | POA: Diagnosis not present

## 2020-05-14 DIAGNOSIS — R7989 Other specified abnormal findings of blood chemistry: Secondary | ICD-10-CM | POA: Diagnosis present

## 2020-05-14 DIAGNOSIS — E876 Hypokalemia: Secondary | ICD-10-CM | POA: Diagnosis present

## 2020-05-14 DIAGNOSIS — I480 Paroxysmal atrial fibrillation: Secondary | ICD-10-CM | POA: Diagnosis present

## 2020-05-14 DIAGNOSIS — Z87820 Personal history of traumatic brain injury: Secondary | ICD-10-CM

## 2020-05-14 DIAGNOSIS — N390 Urinary tract infection, site not specified: Secondary | ICD-10-CM | POA: Diagnosis present

## 2020-05-14 DIAGNOSIS — Z950 Presence of cardiac pacemaker: Secondary | ICD-10-CM

## 2020-05-14 DIAGNOSIS — Z7189 Other specified counseling: Secondary | ICD-10-CM | POA: Diagnosis not present

## 2020-05-14 DIAGNOSIS — K7200 Acute and subacute hepatic failure without coma: Secondary | ICD-10-CM | POA: Diagnosis present

## 2020-05-14 DIAGNOSIS — Z86711 Personal history of pulmonary embolism: Secondary | ICD-10-CM

## 2020-05-14 DIAGNOSIS — R6520 Severe sepsis without septic shock: Secondary | ICD-10-CM | POA: Diagnosis present

## 2020-05-14 DIAGNOSIS — R54 Age-related physical debility: Secondary | ICD-10-CM | POA: Diagnosis present

## 2020-05-14 DIAGNOSIS — I619 Nontraumatic intracerebral hemorrhage, unspecified: Secondary | ICD-10-CM | POA: Diagnosis present

## 2020-05-14 DIAGNOSIS — N179 Acute kidney failure, unspecified: Secondary | ICD-10-CM | POA: Diagnosis present

## 2020-05-14 DIAGNOSIS — Z1621 Resistance to vancomycin: Secondary | ICD-10-CM | POA: Diagnosis present

## 2020-05-14 DIAGNOSIS — E878 Other disorders of electrolyte and fluid balance, not elsewhere classified: Secondary | ICD-10-CM | POA: Diagnosis present

## 2020-05-14 DIAGNOSIS — Z515 Encounter for palliative care: Secondary | ICD-10-CM

## 2020-05-14 DIAGNOSIS — Z86718 Personal history of other venous thrombosis and embolism: Secondary | ICD-10-CM

## 2020-05-14 LAB — URINALYSIS, ROUTINE W REFLEX MICROSCOPIC
Bilirubin Urine: NEGATIVE
Glucose, UA: NEGATIVE mg/dL
Hgb urine dipstick: NEGATIVE
Ketones, ur: NEGATIVE mg/dL
Leukocytes,Ua: NEGATIVE
Nitrite: NEGATIVE
Protein, ur: NEGATIVE mg/dL
Specific Gravity, Urine: 1.014 (ref 1.005–1.030)
pH: 5 (ref 5.0–8.0)

## 2020-05-14 LAB — COMPREHENSIVE METABOLIC PANEL
ALT: 116 U/L — ABNORMAL HIGH (ref 0–44)
AST: 64 U/L — ABNORMAL HIGH (ref 15–41)
Albumin: 3.6 g/dL (ref 3.5–5.0)
Alkaline Phosphatase: 112 U/L (ref 38–126)
BUN: 132 mg/dL — ABNORMAL HIGH (ref 8–23)
CO2: 22 mmol/L (ref 22–32)
Calcium: 10.9 mg/dL — ABNORMAL HIGH (ref 8.9–10.3)
Chloride: >130 mmol/L (ref 98–111)
Creatinine, Ser: 4.09 mg/dL — ABNORMAL HIGH (ref 0.61–1.24)
GFR, Estimated: 14 mL/min — ABNORMAL LOW (ref >60)
Glucose, Bld: 135 mg/dL — ABNORMAL HIGH (ref 70–99)
Potassium: 4.5 mmol/L (ref 3.5–5.1)
Sodium: 171 mmol/L (ref 135–145)
Total Bilirubin: 1.8 mg/dL — ABNORMAL HIGH (ref 0.3–1.2)
Total Protein: 8 g/dL (ref 6.5–8.1)

## 2020-05-14 LAB — CBC WITH DIFFERENTIAL/PLATELET
Abs Immature Granulocytes: 0.13 10*3/uL — ABNORMAL HIGH (ref 0.00–0.07)
Basophils Absolute: 0.1 10*3/uL (ref 0.0–0.1)
Basophils Relative: 0 %
Eosinophils Absolute: 0 10*3/uL (ref 0.0–0.5)
Eosinophils Relative: 0 %
HCT: 59.2 % — ABNORMAL HIGH (ref 39.0–52.0)
Hemoglobin: 17.5 g/dL — ABNORMAL HIGH (ref 13.0–17.0)
Immature Granulocytes: 1 %
Lymphocytes Relative: 6 %
Lymphs Abs: 0.8 10*3/uL (ref 0.7–4.0)
MCH: 30.2 pg (ref 26.0–34.0)
MCHC: 29.6 g/dL — ABNORMAL LOW (ref 30.0–36.0)
MCV: 102.2 fL — ABNORMAL HIGH (ref 80.0–100.0)
Monocytes Absolute: 1.2 10*3/uL — ABNORMAL HIGH (ref 0.1–1.0)
Monocytes Relative: 9 %
Neutro Abs: 11.3 10*3/uL — ABNORMAL HIGH (ref 1.7–7.7)
Neutrophils Relative %: 84 %
Platelets: 269 10*3/uL (ref 150–400)
RBC: 5.79 MIL/uL (ref 4.22–5.81)
RDW: 17.9 % — ABNORMAL HIGH (ref 11.5–15.5)
WBC: 13.6 10*3/uL — ABNORMAL HIGH (ref 4.0–10.5)
nRBC: 0.5 % — ABNORMAL HIGH (ref 0.0–0.2)

## 2020-05-14 LAB — BASIC METABOLIC PANEL
BUN: 130 mg/dL — ABNORMAL HIGH (ref 8–23)
CO2: 24 mmol/L (ref 22–32)
Calcium: 10 mg/dL (ref 8.9–10.3)
Chloride: >130 mmol/L (ref 98–111)
Creatinine, Ser: 3.84 mg/dL — ABNORMAL HIGH (ref 0.61–1.24)
GFR, Estimated: 15 mL/min — ABNORMAL LOW (ref >60)
Glucose, Bld: 132 mg/dL — ABNORMAL HIGH (ref 70–99)
Potassium: 4.6 mmol/L (ref 3.5–5.1)
Sodium: 168 mmol/L (ref 135–145)

## 2020-05-14 LAB — APTT: aPTT: 35 seconds (ref 24–36)

## 2020-05-14 LAB — PROTIME-INR
INR: 1.5 — ABNORMAL HIGH (ref 0.8–1.2)
Prothrombin Time: 17.7 seconds — ABNORMAL HIGH (ref 11.4–15.2)

## 2020-05-14 LAB — LACTIC ACID, PLASMA
Lactic Acid, Venous: 3.4 mmol/L (ref 0.5–1.9)
Lactic Acid, Venous: 3.9 mmol/L (ref 0.5–1.9)

## 2020-05-14 LAB — TROPONIN I (HIGH SENSITIVITY)
Troponin I (High Sensitivity): 217 ng/L (ref <18)
Troponin I (High Sensitivity): 264 ng/L (ref <18)

## 2020-05-14 LAB — RESP PANEL BY RT-PCR (FLU A&B, COVID) ARPGX2
Influenza A by PCR: NEGATIVE
Influenza B by PCR: NEGATIVE
SARS Coronavirus 2 by RT PCR: NEGATIVE

## 2020-05-14 LAB — LIPASE, BLOOD: Lipase: 59 U/L — ABNORMAL HIGH (ref 11–51)

## 2020-05-14 LAB — BRAIN NATRIURETIC PEPTIDE: B Natriuretic Peptide: 76 pg/mL (ref 0.0–100.0)

## 2020-05-14 LAB — BLOOD GAS, VENOUS
Acid-base deficit: 2.1 mmol/L — ABNORMAL HIGH (ref 0.0–2.0)
Bicarbonate: 22.3 mmol/L (ref 20.0–28.0)
FIO2: 32
O2 Saturation: 80.6 %
Patient temperature: 37.8
pCO2, Ven: 40.2 mmHg — ABNORMAL LOW (ref 44.0–60.0)
pH, Ven: 7.367 (ref 7.250–7.430)
pO2, Ven: 55.4 mmHg — ABNORMAL HIGH (ref 32.0–45.0)

## 2020-05-14 LAB — CK: Total CK: 49 U/L (ref 49–397)

## 2020-05-14 LAB — CBG MONITORING, ED: Glucose-Capillary: 118 mg/dL — ABNORMAL HIGH (ref 70–99)

## 2020-05-14 MED ORDER — ACETAMINOPHEN 650 MG RE SUPP
650.0000 mg | Freq: Once | RECTAL | Status: AC
  Administered 2020-05-14: 650 mg via RECTAL
  Filled 2020-05-14: qty 1

## 2020-05-14 MED ORDER — LACTATED RINGERS IV BOLUS (SEPSIS)
250.0000 mL | Freq: Once | INTRAVENOUS | Status: AC
  Administered 2020-05-14: 250 mL via INTRAVENOUS

## 2020-05-14 MED ORDER — VANCOMYCIN HCL IN DEXTROSE 1-5 GM/200ML-% IV SOLN: 1000.0000 mg | Freq: Once | INTRAVENOUS | Status: DC

## 2020-05-14 MED ORDER — SODIUM CHLORIDE 0.9 % IV SOLN
2.0000 g | Freq: Once | INTRAVENOUS | Status: AC
  Administered 2020-05-14: 2 g via INTRAVENOUS
  Filled 2020-05-14: qty 2

## 2020-05-14 MED ORDER — ACETAMINOPHEN 325 MG PO TABS: 650.0000 mg | ORAL_TABLET | Freq: Four times a day (QID) | ORAL | Status: DC | PRN

## 2020-05-14 MED ORDER — SODIUM CHLORIDE 0.9 % IV SOLN
2.0000 g | INTRAVENOUS | Status: DC
  Administered 2020-05-15 – 2020-05-16 (×2): 2 g via INTRAVENOUS
  Filled 2020-05-14 (×2): qty 2

## 2020-05-14 MED ORDER — SODIUM CHLORIDE 0.9 % IV SOLN
3.0000 g | Freq: Four times a day (QID) | INTRAVENOUS | Status: DC
  Administered 2020-05-14: 3 g via INTRAVENOUS
  Filled 2020-05-14: qty 8

## 2020-05-14 MED ORDER — VANCOMYCIN HCL 1500 MG/300ML IV SOLN
1500.0000 mg | Freq: Once | INTRAVENOUS | Status: AC
  Administered 2020-05-14: 1500 mg via INTRAVENOUS
  Filled 2020-05-14: qty 300

## 2020-05-14 MED ORDER — LACTATED RINGERS IV BOLUS (SEPSIS)
1000.0000 mL | Freq: Once | INTRAVENOUS | Status: AC
  Administered 2020-05-14: 1000 mL via INTRAVENOUS

## 2020-05-14 MED ORDER — VANCOMYCIN HCL 750 MG/150ML IV SOLN: 750.0000 mg | INTRAVENOUS | Status: DC

## 2020-05-14 MED ORDER — ACETAMINOPHEN 650 MG RE SUPP
650.0000 mg | Freq: Four times a day (QID) | RECTAL | Status: DC | PRN
  Administered 2020-05-16: 650 mg via RECTAL
  Filled 2020-05-14: qty 1

## 2020-05-14 MED ORDER — SODIUM CHLORIDE 0.9 % IV SOLN
3.0000 g | INTRAVENOUS | Status: DC
  Filled 2020-05-14 (×2): qty 8

## 2020-05-14 MED ORDER — LACTATED RINGERS IV BOLUS
1000.0000 mL | Freq: Once | INTRAVENOUS | Status: AC
  Administered 2020-05-14: 1000 mL via INTRAVENOUS

## 2020-05-14 MED ORDER — ONDANSETRON HCL 4 MG/2ML IJ SOLN: 4.0000 mg | Freq: Four times a day (QID) | INTRAMUSCULAR | Status: DC | PRN

## 2020-05-14 MED ORDER — ONDANSETRON HCL 4 MG PO TABS: 4.0000 mg | ORAL_TABLET | Freq: Four times a day (QID) | ORAL | Status: DC | PRN

## 2020-05-14 MED ORDER — LACTATED RINGERS IV SOLN: INTRAVENOUS | Status: DC

## 2020-05-14 NOTE — ED Notes (Signed)
Family at bedside,  son and son in law.

## 2020-05-14 NOTE — ED Notes (Signed)
CRITICAL VALUE ALERT  Critical Value:  Lactic 3.4  Date & Time Notied:  05/14/20 1840 Provider Notified: Dr. Deretha Emory  Orders Received/Actions taken: see chart

## 2020-05-14 NOTE — Progress Notes (Signed)
Notified provider of need to order repeat lactic acid. ° °

## 2020-05-14 NOTE — ED Triage Notes (Signed)
Pt in from home after recent discharge from [facility]   Had cerebral bleed after a fall as UTI  Sent home then here today after decreased LOC as well as hot to touch as well as AMS  Family told EMS they did not wish full code for pt   No paperwork of same in w patient

## 2020-05-14 NOTE — ED Notes (Addendum)
Critical values from lab  Sodium:  168  Troponin 264  Chloride greater than 130  Dr Herma Carson and Camelia Eng RN notified

## 2020-05-14 NOTE — Progress Notes (Signed)
PHARMACY NOTE:  ANTIMICROBIAL RENAL DOSAGE ADJUSTMENT  Current antimicrobial regimen includes a mismatch between antimicrobial dosage and estimated renal function.  As per policy approved by the Pharmacy & Therapeutics and Medical Executive Committees, the antimicrobial dosage will be adjusted accordingly.  Current antimicrobial dosage:  Cefepime 1gm IV q12h  Indication: unknown source of infection  Renal Function:  Estimated Creatinine Clearance: 15 mL/min (A) (by C-G formula based on SCr of 3.84 mg/dL (H)). []      On intermittent HD, scheduled: []      On CRRT    Antimicrobial dosage has been changed to:  Cefepime 2gm IV q24h  Additional comments:   Thank you for allowing pharmacy to be a part of this patient's care.  RPh 05/14/2020, 11:32 PM

## 2020-05-14 NOTE — ED Notes (Signed)
Date and time results received: 05/14/20 trop 217 (use smartphrase ".now" to insert current time)  Test: trop Critical Value:217  Name of Provider Notified: Dr. Ian Malkin  Orders Received: yes

## 2020-05-14 NOTE — ED Provider Notes (Addendum)
[facility] EMERGENCY DEPARTMENT Provider Note   CSN: [id number] Arrival date & time: 05/14/20  1721     History Chief Complaint  Patient presents with   Altered Mental Status    SIDDHARTHA HOBACK is a 79 y.o. male.  Patient brought in by EMS from home.  Patient nonverbal.  Patient very ill-appearing.  EMS stated that patient had just been discharged from the hospital at [facility] yesterday.  Patient had a prolonged and complicated course related to an intracerebral bleed.  Patient with longstanding history of atrial fib had been on blood thinners now off obviously.  Patient also has a pacemaker family states that it is also a defibrillator.  Patient today was very out of it.  He when he left the hospital he was talkative could use his arms fairly well and was able to eat pured food.  No family members currently with him at this time but they are on their way.  Also EMS related that family had stated that patient wished to be a DNR.  We do not have any official paperwork will discuss with family.        Past Medical History:  Diagnosis Date   Altered mental status 05/29/2015   DVT (deep vein thrombosis) in pregnancy    Hypertension    Pulmonary embolism (HCC)    Sinus node dysfunction ([place])    With pacemaker    Patient Active Problem List   Diagnosis Date Noted   Anticoagulated    PAF (paroxysmal atrial fibrillation) ([place])    Hyponatremia    Intraparenchymal hematoma of brain ([place]) 04/26/2020   ICH (intracerebral hemorrhage) ([place]) 04/26/2020   Left leg cellulitis 03/21/2020   Gout 03/21/2020   Acute deep vein thrombosis (DVT) of femoral vein of right lower extremity ([place]) 03/21/2020   Thrombocytopenia ([place]) 03/21/2020   Gout attack 03/21/2020   Pulmonary embolism ([place]) 03/07/2020   PE (pulmonary thromboembolism) ([place]) 03/06/2020   Constipation 03/06/2020   Essential hypertension 03/06/2020   Hypokalemia 03/06/2020   Elevated serum creatinine  03/06/2020   Elevated BUN 03/06/2020   Serum total bilirubin elevated 03/06/2020   Leukocytosis 03/06/2020   UTI (urinary tract infection) 03/06/2020   Dehydration 03/06/2020   TIA (transient ischemic attack) 05/30/2015   Altered mental status 05/29/2015   Elevated blood pressure 06/03/2013   Pacemaker-medtronic 04/26/2011   Syncope 04/26/2011   Atrial fibrillation ([place]) 04/12/2010   Sinoatrial node dysfunction ([place]) 04/14/2009    Past Surgical History:  Procedure Laterality Date   INSERT / REPLACE / REMOVE PACEMAKER  ~ 2009   Medtronic Versa VEDro1   LAPAROSCOPIC CHOLECYSTECTOMY         Family History  Problem Relation Age of Onset   Hypertension Sister     Social History   Tobacco Use   Smoking status: Never Smoker   Smokeless tobacco: Former Systems developer    Types: Chew   Tobacco comment: 05/30/2015 "chewed tobacco for 40 yrs; quit early 2000's"  Substance Use Topics   Alcohol use: No   Drug use: No    Home Medications Prior to Admission medications   Medication Sig Start Date End Date Taking? Authorizing Provider  amoxicillin (AMOXIL) 500 MG capsule Take 500 mg by mouth 3 (three) times daily. 04/17/20   [provider]  colchicine 0.6 MG tablet Take 1 tablet (0.6 mg total) by mouth 2 (two) times daily. 03/24/20   Oswald Hillock, MD    Allergies    Patient has no known allergies.  Review of Systems   Review of Systems  Unable to perform ROS: Patient nonverbal    Physical Exam Updated Vital Signs BP (!) 169/149    Pulse 94    Temp 99.3 F (37.4 C)    Resp (!) 27    Ht 1.829 m (6')    Wt 68 kg    SpO2 99%    BMI 20.33 kg/m   Physical Exam Vitals and nursing note reviewed.  Constitutional:      General: He is in acute distress.     Appearance: Normal appearance. He is well-developed. He is ill-appearing. He is not diaphoretic.  HENT:     Head: Normocephalic and atraumatic.     Mouth/Throat:     Mouth: Mucous membranes are dry.      Comments: Mucous membranes are extremely dry. Eyes:     Extraocular Movements: Extraocular movements intact.     Conjunctiva/sclera: Conjunctivae normal.     Pupils: Pupils are equal, round, and reactive to light.  Cardiovascular:     Rate and Rhythm: Normal rate and regular rhythm.     Heart sounds: No murmur heard.   Pulmonary:     Effort: Pulmonary effort is normal. No respiratory distress.     Breath sounds: Normal breath sounds.  Abdominal:     General: There is no distension.     Palpations: Abdomen is soft.     Tenderness: There is no abdominal tenderness.     Comments: Patient's abdomen is concave.  Musculoskeletal:        General: No swelling.     Cervical back: No rigidity.  Skin:    General: Skin is warm and dry.     Capillary Refill: Capillary refill takes less than 2 seconds.  Neurological:     Mental Status: He is alert.     Comments: Patient nonverbal is looking around not moving extremities     ED Results / Procedures / Treatments   Labs (all labs ordered are listed, but only abnormal results are displayed) Labs Reviewed  COMPREHENSIVE METABOLIC PANEL - Abnormal; Notable for the following components:      Result Value   Sodium 171 (*)    Chloride >130 (*)    Glucose, Bld 135 (*)    BUN 132 (*)    Creatinine, Ser 4.09 (*)    Calcium 10.9 (*)    AST 64 (*)    ALT 116 (*)    Total Bilirubin 1.8 (*)    GFR, Estimated 14 (*)    All other components within normal limits  LACTIC ACID, PLASMA - Abnormal; Notable for the following components:   Lactic Acid, Venous 3.4 (*)    All other components within normal limits  LACTIC ACID, PLASMA - Abnormal; Notable for the following components:   Lactic Acid, Venous 3.9 (*)    All other components within normal limits  CBC WITH DIFFERENTIAL/PLATELET - Abnormal; Notable for the following components:   WBC 13.6 (*)    Hemoglobin 17.5 (*)    HCT 59.2 (*)    MCV 102.2 (*)    MCHC 29.6 (*)    RDW 17.9 (*)    nRBC  0.5 (*)    Neutro Abs 11.3 (*)    Monocytes Absolute 1.2 (*)    Abs Immature Granulocytes 0.13 (*)    All other components within normal limits  PROTIME-INR - Abnormal; Notable for the following components:   Prothrombin Time 17.7 (*)    INR 1.5 (*)  All other components within normal limits  URINALYSIS, ROUTINE W REFLEX MICROSCOPIC - Abnormal; Notable for the following components:   Color, Urine AMBER (*)    All other components within normal limits  LIPASE, BLOOD - Abnormal; Notable for the following components:   Lipase 59 (*)    All other components within normal limits  BLOOD GAS, VENOUS - Abnormal; Notable for the following components:   pCO2, Ven 40.2 (*)    pO2, Ven 55.4 (*)    Acid-base deficit 2.1 (*)    All other components within normal limits  CBG MONITORING, ED - Abnormal; Notable for the following components:   Glucose-Capillary 118 (*)    All other components within normal limits  CULTURE, BLOOD (ROUTINE X 2)  RESP PANEL BY RT-PCR (FLU A&B, COVID) ARPGX2  CULTURE, BLOOD (ROUTINE X 2)  URINE CULTURE  APTT  BRAIN NATRIURETIC PEPTIDE  URINALYSIS, ROUTINE W REFLEX MICROSCOPIC  CREATININE, SERUM  TROPONIN I (HIGH SENSITIVITY)  TROPONIN I (HIGH SENSITIVITY)    EKG EKG Interpretation  Date/Time:  Sunday May 14 2020 17:25:47 EST Ventricular Rate:  170 PR Interval:    QRS Duration: 112 QT Interval:  290 QTC Calculation: 488 R Axis:   -4 Text Interpretation: Supraventricular tachycardia Multiple premature complexes, vent & supraven IRBBB and LPFB Low voltage, extremity leads Probable posterior infarct, acute No significant change since last tracing Other than ST depression Anteriorly AVR without significant change Reconfirmed by Fredia Sorrow [id number]) on 05/14/2020 5:39:08 PM   Radiology DG Chest Port 1 View  Result Date: 05/14/2020 CLINICAL DATA:  Change in mental status EXAM: PORTABLE CHEST 1 VIEW COMPARISON:  April 26, 2020 FINDINGS: The heart  size and mediastinal contours are within normal limits. There is prominence of the central pulmonary vasculature. No large airspace consolidation or pleural effusion. A left-sided pacemaker seen with the lead tips in the right atrium and right ventricle. The visualized skeletal structures are unremarkable. IMPRESSION: Mild pulmonary vascular congestion. Electronically Signed   By: Prudencio Pair M.D.   On: 05/14/2020 18:11    Procedures Procedures (including critical care time)  CRITICAL CARE Performed by: Fredia Sorrow Total critical care time: 60 minutes Critical care time was exclusive of separately billable procedures and treating other patients. Critical care was necessary to treat or prevent imminent or life-threatening deterioration. Critical care was time spent personally by me on the following activities: development of treatment plan with patient and/or surrogate as well as nursing, discussions with consultants, evaluation of patient's response to treatment, examination of patient, obtaining history from patient or surrogate, ordering and performing treatments and interventions, ordering and review of laboratory studies, ordering and review of radiographic studies, pulse oximetry and re-evaluation of patient's condition.   Medications Ordered in ED Medications  lactated ringers infusion ( Intravenous New Bag/Given 05/14/20 1812)  Ampicillin-Sulbactam (UNASYN) 3 g in sodium chloride 0.9 % 100 mL IVPB (has no administration in time range)  vancomycin (VANCOREADY) IVPB 1500 mg/300 mL (1,500 mg Intravenous New Bag/Given 05/14/20 1804)  vancomycin (VANCOREADY) IVPB 750 mg/150 mL (has no administration in time range)  acetaminophen (TYLENOL) suppository 650 mg (650 mg Rectal Given 05/14/20 1736)  ceFEPIme (MAXIPIME) 2 g in sodium chloride 0.9 % 100 mL IVPB (0 g Intravenous Stopped 05/14/20 1854)  lactated ringers bolus 1,000 mL (0 mLs Intravenous Stopped 05/14/20 1854)    And  lactated  ringers bolus 1,000 mL (0 mLs Intravenous Stopped 05/14/20 1933)    And  lactated ringers bolus 250 mL (250  mLs Intravenous New Bag/Given 05/14/20 1941)    ED Course  I have reviewed the triage vital signs and the nursing notes.  Pertinent labs & imaging results that were available during my care of the patient were reviewed by me and considered in my medical decision making (see chart for details).    MDM Rules/Calculators/A&P                          Patient upon arrival would have met criteria for intubation.  Due to the altered mental status.  Also patient clinically looking very dehydrated.  Patient with a known history of the prior intercerebral bleed.  Did talk to family members.  They made quite clear that the patient did not want to be intubated.  And only wanted fluids antibiotics and did not want to have CPR.  They were willing to put him on BiPAP if necessary  Patient's work-up proceeded with sepsis work-up due to the significant fever.  Patient received lactated Ringer's as part of the resuscitation.  Patient sodium came back extremely high at 171.  On November 18 his sodium was 146.  Had been checked since.  Venous blood gas was reassuring with a pH of 7.36 and PCO2 at 40.  Lactic acids were elevated in the 3 range.  Repeat was at 3.9.  Troponin also significantly elevated.  That will have to be dealt trended.  Patient seemed to improve with the IV fluids.  The lactated Ringer's seem to perk him up started looking around and following folks in the room and saw reconnecting with family members trying to talk some but still was nonverbal.  X-ray negative.  Elevated lactic acids elevated white blood cell count.  Patient's urine was very concentrated.  But no hematuria.  Head CT without acute changes.  Chest x-ray negative.  But patient so dry certainly at risk for aspiration pneumonia.  Patient just symptoms could all be related to the fever and the sodium of 171.  We will continue  fluids.  His blood pressure currently 102.  Heart rate has remained in the 130s.  At probably atrial fibrillation.  Patient no longer on blood thinners obviously due to the intracerebral hemorrhage.  Patient's blood pressure at the lowest was may be in the upper 80s.  Patient making good fluids.  Cussed with the hospitalist who will see patient for admission.  Again patient is a DNR.    Final Clinical Impression(s) / ED Diagnoses Final diagnoses:  Sepsis, due to unspecified organism, unspecified whether acute organ dysfunction present [facility])    Rx / DC Orders ED Discharge Orders    None       Fredia Sorrow, MD 05/14/20 [place]    Fredia Sorrow, MD 05/14/20 2152

## 2020-05-14 NOTE — ED Triage Notes (Signed)
Pt is obtunded   Eyes open, mouth open   Does not respond to questions   Cachetic

## 2020-05-14 NOTE — H&P (Signed)
History and Physical    Luke Crosby:[id number] DOB: [birth year] DOA: 05/14/2020  PCP: Gareth Morgan, MD  Patient coming from: Home.  I have personally briefly reviewed patient's old medical records in [facility]  Chief Complaint: Decreased level of consciousness and fever.  HPI: Luke Crosby is a 79 y.o. male with medical history significant of bilateral pulmonary embolism and RLE DVT in September 2021, sinus node dysfunction, history of pacemaker placement, paroxysmal atrial fibrillation, hypertension, gout who was admitted to [facility] from 04/26/2020 until 05/13/2020 (yesterday) due to intracranial hemorrhage and is brought to the [facility] ED due to worsening LOC and possible fever. He is unable to provide further history. The patient's family members stated that he wished to be DNR under these circumstances.  ED Course: Initial vital signs were temperature 104.3 F, pulse 159, respirations 26 and O2 sat 98% on 3 LPM via [place]. The patient was given supplemental oxygen, vigorous IVF resuscitation with at least 3250 mL of LR, followed by LR at 150 mL/h, cefepime 2 g IVPB, ampicillin/sulbactam 3 g IVPB and vancomycin per pharmacy.  Labs: His urinalysis was amber in color, but was otherwise unremarkable. CBC with a white count of 13.6 with 84% neutrophils, 6% lymphocytes and 9% monocytes. Mildly 17.5 g/dL with an MCV of 242 fL and platelets 269. PT 17.7, INR 1.5 and PTT of 35. Lipase is 59 units/L. Lactic acid was 3.4, then 3.9 and then 1.7 mmol/L. BNP is 76.6 pg/mL. Troponin was 217 and then 264 ng/L. Sodium 171, potassium 4.5, chloride more than 130 and CO2 22 mmol/L. Glucose 135, BUN 132, creatinine 4.09 and calcium 10.9 mg/dL. Total protein is 8.0 and albumin 3.6 g/dL. AST was 64 and ALT 116 units/L. Total bilirubin was 1.8 mg/dL. Total CK is 49 units/L.  Imaging: A 1 view chest radiograph showed mild pulmonary vascular congestion. CT head without contrast showed improved intracranial  hemorrhage. Please see images and full regular report for further detail.  Review of Systems: As per HPI otherwise all other systems reviewed and are negative.  Past Medical History:  Diagnosis Date  . Altered mental status 05/29/2015  . DVT (deep vein thrombosis) in pregnancy   . Hypertension   . Pulmonary embolism (HCC)   . Sinus node dysfunction (HCC)    With pacemaker    Past Surgical History:  Procedure Laterality Date  . INSERT / REPLACE / REMOVE PACEMAKER  ~ 2009   Medtronic Versa VEDro1  . LAPAROSCOPIC CHOLECYSTECTOMY      Social History  reports that he has never smoked. He has quit using smokeless tobacco.  His smokeless tobacco use included chew. He reports that he does not drink alcohol and does not use drugs.  No Known Allergies  Family History  Problem Relation Age of Onset  . Hypertension Sister    Prior to Admission medications   Medication Sig Start Date End Date Taking? Authorizing Provider  colchicine 0.6 MG tablet Take 1 tablet (0.6 mg total) by mouth 2 (two) times daily. 03/24/20  Yes Meredeth Ide, MD  amoxicillin (AMOXIL) 500 MG capsule Take 500 mg by mouth 3 (three) times daily. Patient not taking: Reported on 05/14/2020 04/17/20   [provider]    Physical Exam: Vitals:   05/14/20 2030 05/14/20 2130 05/14/20 2200 05/14/20 2230  BP: 102/75 105/85 97/77 108/74  Pulse: (!) 134 (!) 139 (!) 130 (!) 52  Resp: (!) 28 (!) 30 (!) 21 (!) 26  Temp: 99.3 F (37.4  C) 98.4 F (36.9 C) 98.4 F (36.9 C) 98.4 F (36.9 C)  TempSrc:      SpO2: 100% 97% 100% 100%  Weight:      Height:        Constitutional: Looks acutely ill. Eyes: PERRL, lids and conjunctivae mildly injected. ENMT: Mucous membranes are dry. Posterior pharynx clear of any exudate or lesions. Neck: normal, supple, no masses, no thyromegaly Respiratory: Tachypneic in the mid to high 20s.  No accessory muscle use.  No wheezing rhonchi or crackles. Cardiovascular: Tachycardic in  the 130s, no murmurs / rubs / gallops. No extremity edema. 2+ pedal pulses. No carotid bruits.  Abdomen: Nondistended.  BS positive.  No tenderness, no masses palpated. No hepatosplenomegaly. Bowel sounds positive.  Musculoskeletal: no clubbing / cyanosis.  Good ROM, no contractures. Normal muscle tone.  Skin: Decreased some areas of ecchymosis on extremities.  On very limited dermatological examination. Neurologic: CN 2-12 grossly intact. Sensation intact, DTR normal. Strength 5/5 in all 4.  Psychiatric: Normal judgment and insight. Alert and oriented x 3. Normal mood.  Labs on Admission: I have personally reviewed following labs and imaging studies  CBC: Recent Labs  Lab 05/11/20 0053 05/14/20 1733  WBC 10.6* 13.6*  NEUTROABS  --  11.3*  HGB 14.1 17.5*  HCT 43.7 59.2*  MCV 91.6 102.2*  PLT 281 269    Basic Metabolic Panel: Recent Labs  Lab 05/11/20 0053 05/14/20 1733 05/14/20 2101  NA 146* 171* 168*  K 5.2* 4.5 4.6  CL 108 >130* >130*  CO2 25 22 24   GLUCOSE 185* 135* 132*  BUN 111* 132* 130*  CREATININE 3.79* 4.09* 3.84*  CALCIUM 10.1 10.9* 10.0   GFR: Estimated Creatinine Clearance: 15 mL/min (A) (by C-G formula based on SCr of 3.84 mg/dL (H)).  Liver Function Tests: Recent Labs  Lab 05/14/20 1733  AST 64*  ALT 116*  ALKPHOS 112  BILITOT 1.8*  PROT 8.0  ALBUMIN 3.6   Urine analysis:    Component Value Date/Time   COLORURINE AMBER (A) 05/14/2020 1733   APPEARANCEUR CLEAR 05/14/2020 1733   LABSPEC 1.014 05/14/2020 1733   PHURINE 5.0 05/14/2020 1733   GLUCOSEU NEGATIVE 05/14/2020 1733   HGBUR NEGATIVE 05/14/2020 1733   BILIRUBINUR NEGATIVE 05/14/2020 1733   KETONESUR NEGATIVE 05/14/2020 1733   PROTEINUR NEGATIVE 05/14/2020 1733   NITRITE NEGATIVE 05/14/2020 1733   LEUKOCYTESUR NEGATIVE 05/14/2020 1733   Radiological Exams on Admission: CT Head Wo Contrast  Result Date: 05/14/2020 CLINICAL DATA:  Intracranial hemorrhage, follow-up EXAM: CT HEAD  WITHOUT CONTRAST TECHNIQUE: Contiguous axial images were obtained from the base of the skull through the vertex without intravenous contrast. COMPARISON:  05/08/2020 FINDINGS: Brain: Continued evolution of left frontal parenchymal hemorrhage with further decreased size and density. Small volume residual hemorrhage layering in the occipital horns. Stable findings of chronic microvascular ischemic changes and parenchymal volume loss. Chronic right parasagittal frontal infarct. No new hemorrhage or loss of gray-white differentiation. Vascular: There is atherosclerotic calcification at the skull base. Skull: Calvarium is unremarkable. Sinuses/Orbits: No acute finding. Other: None. IMPRESSION: Expected evolution of left frontal parenchymal hemorrhage. Small volume residual intraventricular hemorrhage. No acute findings. Electronically Signed   By: 05/10/2020 M.D.   On: 05/14/2020 20:46   DG Chest Port 1 View  Result Date: 05/14/2020 CLINICAL DATA:  Change in mental status EXAM: PORTABLE CHEST 1 VIEW COMPARISON:  April 26, 2020 FINDINGS: The heart size and mediastinal contours are within normal limits. There is prominence of  the central pulmonary vasculature. No large airspace consolidation or pleural effusion. A left-sided pacemaker seen with the lead tips in the right atrium and right ventricle. The visualized skeletal structures are unremarkable. IMPRESSION: Mild pulmonary vascular congestion. Electronically Signed   By: Jonna Clark M.D.   On: 05/14/2020 18:11   Echocardiogram 04/26/2020   IMPRESSIONS   1. Left ventricular ejection fraction, by estimation, is 55%. The left  ventricle has low normal function. The left ventricle has no regional wall  motion abnormalities. There is mild asymmetric left ventricular  hypertrophy of the septal segment. Left  ventricular diastolic parameters are indeterminate.  2. Right ventricular systolic function is mildly reduced. The right  ventricular size is  mildly enlarged. There is moderately elevated  pulmonary artery systolic pressure. The estimated right ventricular  systolic pressure is 47.3 mmHg.  3. Left atrial size was mildly dilated.  4. The mitral valve is normal in structure. Trivial mitral valve  regurgitation. No evidence of mitral stenosis.  5. Tricuspid valve regurgitation is mild to moderate.  6. The aortic valve is grossly normal. There is mild calcification of the  aortic valve. Aortic valve regurgitation is mild. No aortic stenosis is  present.  7. Aortic dilatation noted. There is mild dilatation of the ascending  aorta, measuring 42 mm.  8. The inferior vena cava is dilated in size with <50% respiratory  variability, suggesting right atrial pressure of 15 mmHg  EKG: Independently reviewed.  Vent. rate 170 BPM PR interval * ms QRS duration 112 ms QT/QTc 290/488 ms P-R-T axes 56 -4 42 Supraventricular tachycardia Multiple premature complexes, vent & supraven IRBBB and LPFB Low voltage, extremity leads Probable posterior infarct, acute No significant change since last tracing Other than ST depression Anteriorly  Assessment/Plan Principal Problem:   Hypernatremia   Pre-renal AKI (acute kidney injury) (HCC) Admit to stepdown/inpatient. Continue LR infusion 150 mL/h. 0.45% NaCl once tachycardia, BP and acidosis better. Encourage oral fluid intake once more alert. Monitor intake and output. Monitor sodium, renal function electrolytes.  Active Problems:   Sepsis due to undetermined organism POA (HCC) VS SIRS (due to severe dehydration/central fever) Continue IV fluids. Continue cefepime per pharmacy. Continue Unasyn per pharmacy. Continue vancomycin per pharmacy. Check procalcitonin. Follow-up blood culture and sensitivity.    Lactic acidosis Continue LR infusion. Continue IV antibiotics. Monitor lactic acid level.    ICH (intracerebral hemorrhage) (HCC) Improving on CT scanning. Neurochecks  every 4 hours.    Elevated troponin Likely due to demand ischemia. Follow-up level in a.m.    Essential hypertension Hold antihypertensives at the moment. Monitor blood pressure.    PAF (paroxysmal atrial fibrillation) (HCC) Off anticoagulation given recent ICH. Not on rate control medication. Continue IV hydration. Use as needed IVP CCB or BB.    Abnormal LFTs Continue IV fluids. Continue sepsis treatment. Follow-up hepatic test in a.m.    History of pulmonary embolus (PE) Anticoagulation has been held. Not a candidate for IVC placement at the moment.   DVT prophylaxis: SCDs. Code Status:   DNR.  Family Communication: Disposition Plan:   Patient is from:  Home.  Anticipated DC to:  TBD.  Anticipated DC date:  05/17/2020.  Anticipated DC barriers: Clinical status.  Consults called: Admission status:  Inpatient/stepdown.  Severity of Illness:  Very high due to altered mental status in the setting of recent intracranial hemorrhage now with hypernatremia, multiorgan dysfunction showing AKI, hyponatremia, hypotension and tachycardia.  I anticipate that the patient will be hospitalized for at  least 2 to 3 days.  Bobette Moavid Manuel Zionna Homewood MD [facility]  How to contact the [facility] Attending or Consulting provider 7A - 7P or covering provider during after hours 7P -7A, for this patient?   1. Check the care team in [facility] and look for a) attending/consulting TRH provider listed and b) the [facility] team listed 2. Log into [email or web address] and use [place]'s universal password to access. If you do not have the password, please contact the hospital operator. 3. Locate the [facility] provider you are looking for under [facility] and page to a number that you can be directly reached. 4. If you still have difficulty reaching the provider, please page the [facility] (Director on Call) for the Hospitalists listed on amion for assistance.  05/14/2020, 11:30 PM   This document was prepared using  Dragon voice recognition software and may contain some unintended transcription errors.

## 2020-05-14 NOTE — Progress Notes (Addendum)
Pharmacy Antibiotic Note  Luke Crosby is a 79 y.o. male admitted on 05/14/2020 with unknown source of infection.  Pharmacy has been consulted for Vancomycin and Cefepime dosing.  Plan: Vancomycin 1500 mg IV x 1 dose. Vancomycin 750 mg IV every 48 hours. Expected AUC 470. Cefepime 2000 mg and Unasyn 3000 mg IV x 1 ordered in ED. Will follow-up with admitting physician about antibiotics Monitor labs, c/s, and vanco level as indicated.  Height: 6' (182.9 cm) Weight: 68 kg (149 lb 14.6 oz) IBW/kg (Calculated) : 77.6  Temp (24hrs), Avg:101.1 F (38.4 C), Min:99.3 F (37.4 C), Max:104.3 F (40.2 C)  Recent Labs  Lab 05/11/20 0053 05/14/20 1733 05/14/20 1851  WBC 10.6* 13.6*  --   CREATININE 3.79* 4.09*  --   LATICACIDVEN  --  3.4* 3.9*    Estimated Creatinine Clearance: 14.1 mL/min (A) (by C-G formula based on SCr of 4.09 mg/dL (H)).    No Known Allergies  Antimicrobials this admission: Vanco 11/21 >>  Cefepime 11/21 x 1 Unasyn 11/21 x 1   Microbiology results: 11/21 BCx: pending 11/21 UCx: pending    Thank you for allowing pharmacy to be a part of this patient's care.  Tad Moore 05/14/2020 8:11 PM

## 2020-05-14 NOTE — ED Notes (Signed)
PT report called to [facility], verbalized complete understanding of Pt plan of care and current condition denies questions at this time. Pt resting on stretcher with rails up x 2. Pt remains nonverbal and only responsive to pain or voice, same given in report. Pt continues on cardiac monitor and [place] @ 4L To be transported with RN to ICU bed.

## 2020-05-14 NOTE — ED Notes (Signed)
CRITICAL VALUE ALERT  Critical Value:  Na 171, CL greater than 130  Date & Time Notied:  05/14/2020, 1837  Provider Notified: Dr. Deretha Emory  Orders Received/Actions taken: see chart

## 2020-05-15 ENCOUNTER — Other Ambulatory Visit: Payer: Self-pay

## 2020-05-15 DIAGNOSIS — Z515 Encounter for palliative care: Secondary | ICD-10-CM

## 2020-05-15 DIAGNOSIS — E870 Hyperosmolality and hypernatremia: Secondary | ICD-10-CM | POA: Diagnosis not present

## 2020-05-15 DIAGNOSIS — Z86711 Personal history of pulmonary embolism: Secondary | ICD-10-CM

## 2020-05-15 DIAGNOSIS — A419 Sepsis, unspecified organism: Secondary | ICD-10-CM | POA: Diagnosis present

## 2020-05-15 LAB — BASIC METABOLIC PANEL
Anion gap: 12 (ref 5–15)
Anion gap: 10 (ref 5–15)
BUN: 125 mg/dL — ABNORMAL HIGH (ref 8–23)
BUN: 120 mg/dL — ABNORMAL HIGH (ref 8–23)
BUN: 106 mg/dL — ABNORMAL HIGH (ref 8–23)
CO2: 23 mmol/L (ref 22–32)
CO2: 24 mmol/L (ref 22–32)
CO2: 23 mmol/L (ref 22–32)
Calcium: 9.8 mg/dL (ref 8.9–10.3)
Calcium: 9.4 mg/dL (ref 8.9–10.3)
Calcium: 9.5 mg/dL (ref 8.9–10.3)
Chloride: 133 mmol/L (ref 98–111)
Chloride: 130 mmol/L — ABNORMAL HIGH (ref 98–111)
Chloride: >130 mmol/L (ref 98–111)
Creatinine, Ser: 3.35 mg/dL — ABNORMAL HIGH (ref 0.61–1.24)
Creatinine, Ser: 2.79 mg/dL — ABNORMAL HIGH (ref 0.61–1.24)
Creatinine, Ser: 2.47 mg/dL — ABNORMAL HIGH (ref 0.61–1.24)
GFR, Estimated: 18 mL/min — ABNORMAL LOW (ref >60)
GFR, Estimated: 22 mL/min — ABNORMAL LOW (ref >60)
GFR, Estimated: 26 mL/min — ABNORMAL LOW (ref >60)
Glucose, Bld: 132 mg/dL — ABNORMAL HIGH (ref 70–99)
Glucose, Bld: 157 mg/dL — ABNORMAL HIGH (ref 70–99)
Glucose, Bld: 126 mg/dL — ABNORMAL HIGH (ref 70–99)
Potassium: 3.8 mmol/L (ref 3.5–5.1)
Potassium: 3.5 mmol/L (ref 3.5–5.1)
Potassium: 3.6 mmol/L (ref 3.5–5.1)
Sodium: 168 mmol/L (ref 135–145)
Sodium: 164 mmol/L (ref 135–145)
Sodium: 164 mmol/L (ref 135–145)

## 2020-05-15 LAB — COMPREHENSIVE METABOLIC PANEL
ALT: 81 U/L — ABNORMAL HIGH (ref 0–44)
AST: 44 U/L — ABNORMAL HIGH (ref 15–41)
Albumin: 2.7 g/dL — ABNORMAL LOW (ref 3.5–5.0)
Alkaline Phosphatase: 78 U/L (ref 38–126)
BUN: 123 mg/dL — ABNORMAL HIGH (ref 8–23)
CO2: 23 mmol/L (ref 22–32)
Calcium: 9.8 mg/dL (ref 8.9–10.3)
Chloride: >130 mmol/L (ref 98–111)
Creatinine, Ser: 3.15 mg/dL — ABNORMAL HIGH (ref 0.61–1.24)
GFR, Estimated: 19 mL/min — ABNORMAL LOW (ref >60)
Glucose, Bld: 119 mg/dL — ABNORMAL HIGH (ref 70–99)
Potassium: 3.8 mmol/L (ref 3.5–5.1)
Sodium: 169 mmol/L (ref 135–145)
Total Bilirubin: 1.3 mg/dL — ABNORMAL HIGH (ref 0.3–1.2)
Total Protein: 5.8 g/dL — ABNORMAL LOW (ref 6.5–8.1)

## 2020-05-15 LAB — CBC
HCT: 48.8 % (ref 39.0–52.0)
Hemoglobin: 13.8 g/dL (ref 13.0–17.0)
MCH: 29.7 pg (ref 26.0–34.0)
MCHC: 28.3 g/dL — ABNORMAL LOW (ref 30.0–36.0)
MCV: 104.9 fL — ABNORMAL HIGH (ref 80.0–100.0)
Platelets: 157 10*3/uL (ref 150–400)
RBC: 4.65 MIL/uL (ref 4.22–5.81)
RDW: 16.6 % — ABNORMAL HIGH (ref 11.5–15.5)
WBC: 13.7 10*3/uL — ABNORMAL HIGH (ref 4.0–10.5)
nRBC: 0.2 % (ref 0.0–0.2)

## 2020-05-15 LAB — LACTIC ACID, PLASMA: Lactic Acid, Venous: 1.7 mmol/L (ref 0.5–1.9)

## 2020-05-15 LAB — PROCALCITONIN: Procalcitonin: 0.71 ng/mL

## 2020-05-15 LAB — GLUCOSE, CAPILLARY
Glucose-Capillary: 105 mg/dL — ABNORMAL HIGH (ref 70–99)
Glucose-Capillary: 105 mg/dL — ABNORMAL HIGH (ref 70–99)

## 2020-05-15 LAB — TROPONIN I (HIGH SENSITIVITY)
Troponin I (High Sensitivity): 350 ng/L (ref <18)
Troponin I (High Sensitivity): 245 ng/L (ref <18)

## 2020-05-15 LAB — MRSA PCR SCREENING: MRSA by PCR: NEGATIVE

## 2020-05-15 MED ORDER — SODIUM CHLORIDE 0.45 % IV SOLN: INTRAVENOUS | Status: DC

## 2020-05-15 MED ORDER — METOPROLOL TARTRATE 5 MG/5ML IV SOLN
2.5000 mg | Freq: Three times a day (TID) | INTRAVENOUS | Status: DC
  Administered 2020-05-15 – 2020-05-16 (×4): 2.5 mg via INTRAVENOUS
  Filled 2020-05-15 (×4): qty 5

## 2020-05-15 MED ORDER — DEXTROSE 5 % IV SOLN: INTRAVENOUS | Status: DC

## 2020-05-15 MED ORDER — ORAL CARE MOUTH RINSE
15.0000 mL | Freq: Two times a day (BID) | OROMUCOSAL | Status: DC
  Administered 2020-05-15 – 2020-05-20 (×10): 15 mL via OROMUCOSAL

## 2020-05-15 MED ORDER — CHLORHEXIDINE GLUCONATE CLOTH 2 % EX PADS
6.0000 | MEDICATED_PAD | Freq: Every day | CUTANEOUS | Status: DC
  Administered 2020-05-15 – 2020-05-19 (×5): 6 via TOPICAL

## 2020-05-15 NOTE — TOC Initial Note (Signed)
Transition of Care [facility]) - Initial/Assessment Note    Patient Details  Name: Luke Crosby MRN: [id number] Date of Birth: [birth year]  [facility] of [facility]) CM/SW Contact:    Elliot Gault, LCSW Phone Number: 05/15/2020, 10:55 AM  Clinical Narrative:                  Pt admitted from home. Pt was discharged from [facility] two days ago. During that hospitalization, pt had been recommended for CIR. Pt's insurance did not approve the CIR admission and pt's family did not want SNF rehab so arrangements were made for family to take pt home with [facility] and family support.  Palliative Care is consulted for goals of care. Awaiting outcome of this consult.   Updated [facility] at [place] of pt's admission. TOC will follow and assist as needed.   Expected Discharge Plan: Skilled Nursing Facility Barriers to Discharge: Continued Medical Work up   Patient Goals and CMS Choice        Expected Discharge Plan and Services Expected Discharge Plan: Skilled Nursing Facility In-house Referral: Clinical Social Work     Living arrangements for the past 2 months: Single Family Home                                      Prior Living Arrangements/Services Living arrangements for the past 2 months: Single Family Home Lives with:: Adult Children Patient language and need for interpreter reviewed:: Yes Do you feel safe going back to the place where you live?: Yes      Need for Family Participation in Patient Care: Yes (Comment) Care giver support system in place?: Yes (comment) Current home services: DME, Home PT Criminal Activity/Legal Involvement Pertinent to Current Situation/Hospitalization: No - Comment as needed  Activities of Daily Living Home Assistive Devices/Equipment: Nurse, adult, Environmental consultant (specify type), Wheelchair, Bedside commode/3-in-1, Medical laboratory scientific officer (specify quad or straight) ADL Screening (condition at time of admission) Patient's cognitive ability adequate to safely complete  daily activities?: No Is the patient deaf or have difficulty hearing?: No Does the patient have difficulty seeing, even when wearing glasses/contacts?: No Does the patient have difficulty concentrating, remembering, or making decisions?: Yes Patient able to express need for assistance with ADLs?: No Does the patient have difficulty dressing or bathing?: Yes Independently performs ADLs?: No Communication: Independent Dressing (OT): Needs assistance Is this a change from baseline?: Pre-admission baseline Grooming: Needs assistance Is this a change from baseline?: Pre-admission baseline Feeding: Needs assistance Is this a change from baseline?: Pre-admission baseline Bathing: Needs assistance Is this a change from baseline?: Pre-admission baseline Toileting: Needs assistance Is this a change from baseline?: Pre-admission baseline In/Out Bed: Needs assistance Is this a change from baseline?: Pre-admission baseline Walks in Home: Needs assistance Is this a change from baseline?: Pre-admission baseline Does the patient have difficulty walking or climbing stairs?: No Weakness of Legs: None Weakness of Arms/Hands: None  Permission Sought/Granted                  Emotional Assessment       Orientation: : Oriented to Self Alcohol / Substance Use: Not Applicable Psych Involvement: No (comment)  Admission diagnosis:  Hypernatremia [E87.0] Sepsis, due to unspecified organism, unspecified whether acute organ dysfunction present [facility]) [A41.9] Patient Active Problem List   Diagnosis Date Noted  . Sepsis due to undetermined organism (HCC) 05/15/2020  . Hypernatremia 05/14/2020  . AKI (acute  kidney injury) (HCC) 05/14/2020  . Abnormal LFTs 05/14/2020  . Lactic acidosis 05/14/2020  . History of pulmonary embolus (PE) 05/14/2020  . Elevated troponin 05/14/2020  . Anticoagulated   . PAF (paroxysmal atrial fibrillation) (HCC)   . Hyponatremia   . Intraparenchymal hematoma of brain  (HCC) 04/26/2020  . ICH (intracerebral hemorrhage) (HCC) 04/26/2020  . Left leg cellulitis 03/21/2020  . Gout 03/21/2020  . Acute deep vein thrombosis (DVT) of femoral vein of right lower extremity (HCC) 03/21/2020  . Thrombocytopenia (HCC) 03/21/2020  . Gout attack 03/21/2020  . Pulmonary embolism (HCC) 03/07/2020  . PE (pulmonary thromboembolism) (HCC) 03/06/2020  . Constipation 03/06/2020  . Essential hypertension 03/06/2020  . Hypokalemia 03/06/2020  . Elevated serum creatinine 03/06/2020  . Elevated BUN 03/06/2020  . Serum total bilirubin elevated 03/06/2020  . Leukocytosis 03/06/2020  . UTI (urinary tract infection) 03/06/2020  . Dehydration 03/06/2020  . TIA (transient ischemic attack) 05/30/2015  . Altered mental status 05/29/2015  . Elevated blood pressure 06/03/2013  . 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 Syncope 04/26/2011  . Atrial fibrillation (HCC) 04/12/2010  . Sinoatrial node dysfunction (HCC) 04/14/2009   PCP:  Gareth Morgan, MD Pharmacy:   [facility] [address] [place] [address] [place] [address] [place] [address] [place] [address] Phone: [phone] Fax: [phone]     Social Determinants of Health (SDOH) Interventions    Readmission Risk Interventions Readmission Risk Prevention Plan 05/15/2020  Transportation Screening Complete  Palliative Care Screening Complete  Medication Review (RN Care Manager) Complete  Some recent data might be hidden

## 2020-05-15 NOTE — Progress Notes (Signed)
CRITICAL VALUE ALERT  Critical Value:  Sodium 164  Date & Time Notied:  05/15/20 1133  Provider Notified: Hampton Abbot MD  Orders Received/Actions taken: awaiting

## 2020-05-15 NOTE — Progress Notes (Signed)
Patient Demographics:    Luke Crosby, is a 79 y.o. male, DOB - 03-31-1941, JSE:[id number]  Admit date - 05/14/2020   Admitting Physician Reubin Milan, MD  Outpatient Primary MD for the patient is Lemmie Evens, MD  LOS - 1   Chief Complaint  Patient presents with  . Altered Mental Status        Subjective:    Luke Crosby today remains very lethargic and unresponsive -Patient son at bedside  Assessment  & Plan :    Principal Problem:   Hypernatremia Active Problems:   Essential hypertension   ICH (intracerebral hemorrhage) (HCC)   PAF (paroxysmal atrial fibrillation) (HCC)   AKI (acute kidney injury) ([place])   Abnormal LFTs   Lactic acidosis   History of pulmonary embolus (PE)   Elevated troponin   Sepsis due to undetermined organism [facility])  Brief Summary:-  79 y.o. male with medical history significant of bilateral pulmonary embolism and RLE DVT in September 2021, fibrillation with sinus node dysfunction, history of pacemaker placement, HTN and  gout who was admitted to [facility] from 04/26/2020 until 05/13/2020 due to intracranial hemorrhage secondary to mechanical fall with head injury while anticoagulated with Eliquis for A. fib and DVT/ PE --- patient was DC'd home on 05/13/2020 with home health services after insurance refused to pay for inpatient rehab and son was not happy with patient going to SNF rehab ---- ---patient returns to  [facility] on 05/14/2020 with worsening LOC and found to be severely dehydrated with a sodium of 171 and chloride over 130 with a creatinine of 4.0  A/p 1)Severe hypernatremia with significant acute metabolic encephalopathy and lethargy -Admission sodium 171, sodium down to 169  -We will change IV fluid to dextrose water -Check BMP every 6 hours and adjust fluid composition and rate accordingly -Avoid over aggressive correction -Suspect significant free  water deficit due to decreased oral intake in the setting of recent intracranial hemorrhage  2)AKI----acute kidney injury due to severe dehydration -  creatinine on admission= 4.0  , baseline creatinine = 0.9 (05/03/20)    ,  creatinine is now= 2.79  ,  renally adjust medications, avoid nephrotoxic agents / dehydration  / hypotension -Continue to hydrate iv  3)Traumatic, Left frontal intraparenchymal hemorrhage, small volume SDH/ SAH----date of injury 04/26/2020 - s/p reversal with Kcentra and vitamin K -Seen and evaluated by neurosurgery, no surgical intervention advised -Repeat CT head on 05/14/2020 noted  4) chronic A. fib with RVR-- CHA2DS2-=VASc score 3, HAS-BLED score 3 --no further anticoagulation due to traumatic ICH  5)PE with extensive RLE DVT--- diagnosed 03/06/2020 -- -- Eliquis discontinued due to traumatic ICH  6)Elevated LFTs--setting of hypotension and possible shock liver, -T bili is down to 1.3 from 1.8 -ALT down to 81 from 116 -AST is down to 44 from 64  7) SIRS without sepsis--- met SIRS criteria on admission, lactic acid was elevated,--- however no infectious source has been identified -Patient's constellation of symptoms is most likely due to severe dehydration and A. fib with RVR --- Patient has not ruled in for sepsis at this time -Currently on Vanco and cefepime pending further culture data  8) acute metabolic encephalopathy--- secondary to #1, #2 #3 above  9)Social/Ethics--plan of care discussed with  patient's son, patient is a DNR -Palliative care consult for goals of care requested  Disposition/Need for in-Hospital Stay- patient unable to be discharged at this time due to --severe hypernatremia and dehydration with AKI as well as  acute metabolic encephalopathy requiring IV fluids until patient is awake enough to take oral liquids  Status is: Inpatient  Remains inpatient appropriate because:severe hypernatremia and dehydration with AKI as well as  acute  metabolic encephalopathy requiring IV fluids until patient is awake enough to take oral liquids   Disposition: The patient is from: Home              Anticipated d/c is to: TBD---?? Hospice Vs SNF              Anticipated d/c date is: > 3 days              Patient currently is not medically stable to d/c. Barriers: Not Clinically Stable- -severe hypernatremia and dehydration with AKI as well as  acute metabolic encephalopathy requiring IV fluids until patient is awake enough to take oral liquids  Code Status : DNR  Family Communication:   Discussed with son   Consults  :  palliative  DVT Prophylaxis  :   - SCDs   Lab Results  Component Value Date   PLT 157 05/15/2020    Inpatient Medications  Scheduled Meds: . Chlorhexidine Gluconate Cloth  6 each Topical Daily   Continuous Infusions: . ampicillin-sulbactam (UNASYN) IV    . ceFEPime (MAXIPIME) IV    . dextrose 140 mL/hr at 05/15/20 0755  . [START ON 05/16/2020] vancomycin     PRN Meds:.acetaminophen **OR** acetaminophen, ondansetron **OR** ondansetron (ZOFRAN) IV    Anti-infectives (From admission, onward)   Start     Dose/Rate Route Frequency Ordered Stop   05/16/20 1800  vancomycin (VANCOREADY) IVPB 750 mg/150 mL        750 mg 150 mL/hr over 60 Minutes Intravenous Every 48 hours 05/14/20 1842     05/15/20 1800  ceFEPIme (MAXIPIME) 2 g in sodium chloride 0.9 % 100 mL IVPB       Note to Pharmacy: Adjust dosage as needed please. TY.   2 g 200 mL/hr over 30 Minutes Intravenous Every 24 hours 05/14/20 2325     05/15/20 1200  Ampicillin-Sulbactam (UNASYN) 3 g in sodium chloride 0.9 % 100 mL IVPB        3 g 200 mL/hr over 30 Minutes Intravenous Every 24 hours 05/14/20 2055 05/15/20 1500   05/14/20 1800  ceFEPIme (MAXIPIME) 2 g in sodium chloride 0.9 % 100 mL IVPB        2 g 200 mL/hr over 30 Minutes Intravenous  Once 05/14/20 1752 05/14/20 1854   05/14/20 1800  Ampicillin-Sulbactam (UNASYN) 3 g in sodium chloride 0.9 %  100 mL IVPB  Status:  Discontinued        3 g 200 mL/hr over 30 Minutes Intravenous Every 6 hours 05/14/20 1752 05/14/20 2055   05/14/20 1800  vancomycin (VANCOCIN) IVPB 1000 mg/200 mL premix  Status:  Discontinued        1,000 mg 200 mL/hr over 60 Minutes Intravenous  Once 05/14/20 1752 05/14/20 1755   05/14/20 1800  vancomycin (VANCOREADY) IVPB 1500 mg/300 mL        1,500 mg 150 mL/hr over 120 Minutes Intravenous  Once 05/14/20 1755 05/14/20 2031        Objective:   Vitals:   05/15/20 0700 05/15/20 0800 05/15/20 0900  05/15/20 1000  BP: 94/67 101/84  92/70  Pulse: (!) 127 (!) 124 (!) 144 (!) 142  Resp: 19 (!) 22 11 (!) 22  Temp: (!) 96.8 F (36 C) (!) 96.4 F (35.8 C) (!) 96.6 F (35.9 C) (!) 96.4 F (35.8 C)  TempSrc:      SpO2: 99% 97% 98% 97%  Weight:      Height:        Wt Readings from Last 3 Encounters:  05/15/20 65.9 kg  05/13/20 68 kg  03/20/20 74 kg     Intake/Output Summary (Last 24 hours) at 05/15/2020 1123 Last data filed at 05/15/2020 0800 Gross per 24 hour  Intake 780.54 ml  Output 625 ml  Net 155.54 ml     Physical Exam  Gen:-Very lethargic, not really arousable Lungs-diminished breath sounds, no wheezing CV- S1, S2 normal, irregularly irregular, tachycardic Abd-  +ve B.Sounds, Abd Soft, No tenderness,    Extremity/Skin:- No  edema, pedal pulses present  Neuro-Psych-very very limited exam due to profound lethargy and unresponsiveness   Data Review:   Micro Results Recent Results (from the past 240 hour(s))  Resp Panel by RT-PCR (Flu A&B, Covid) Nasopharyngeal Swab     Status: None   Collection Time: 05/14/20  5:37 PM   Specimen: Nasopharyngeal Swab; Nasopharyngeal(NP) swabs in vial transport medium  Result Value Ref Range Status   SARS Coronavirus 2 by RT PCR NEGATIVE NEGATIVE Final    Comment: (NOTE) SARS-CoV-2 target nucleic acids are NOT DETECTED.  The SARS-CoV-2 RNA is generally detectable in upper respiratory specimens during  the acute phase of infection. The lowest concentration of SARS-CoV-2 viral copies this assay can detect is 138 copies/mL. A negative result does not preclude SARS-Cov-2 infection and should not be used as the sole basis for treatment or other patient management decisions. A negative result may occur with  improper specimen collection/handling, submission of specimen other than nasopharyngeal swab, presence of viral mutation(s) within the areas targeted by this assay, and inadequate number of viral copies(<138 copies/mL). A negative result must be combined with clinical observations, patient history, and epidemiological information. The expected result is Negative.  Fact Sheet for Patients:  EntrepreneurPulse.com.au  Fact Sheet for Healthcare Providers:  IncredibleEmployment.be  This test is no t yet approved or cleared by the [place] FDA and  has been authorized for detection and/or diagnosis of SARS-CoV-2 by FDA under an Emergency Use Authorization (EUA). This EUA will remain  in effect (meaning this test can be used) for the duration of the COVID-19 declaration under Section 564(b)(1) of the Act, 21 U.S.C.section 360bbb-3(b)(1), unless the authorization is terminated  or revoked sooner.       Influenza A by PCR NEGATIVE NEGATIVE Final   Influenza B by PCR NEGATIVE NEGATIVE Final    Comment: (NOTE) The Xpert Xpress SARS-CoV-2/FLU/RSV plus assay is intended as an aid in the diagnosis of influenza from Nasopharyngeal swab specimens and should not be used as a sole basis for treatment. Nasal washings and aspirates are unacceptable for Xpert Xpress SARS-CoV-2/FLU/RSV testing.  Fact Sheet for Patients: EntrepreneurPulse.com.au  Fact Sheet for Healthcare Providers: IncredibleEmployment.be  This test is not yet approved or cleared by the [place] FDA and has been authorized for detection and/or  diagnosis of SARS-CoV-2 by FDA under an Emergency Use Authorization (EUA). This EUA will remain in effect (meaning this test can be used) for the duration of the COVID-19 declaration under Section 564(b)(1) of the Act, 21 U.S.C. section 360bbb-3(b)(1),  unless the authorization is terminated or revoked.  Performed at [facility], [address], [place], [place] [address]   Culture, blood (Routine x 2)     Status: None (Preliminary result)   Collection Time: 05/14/20  5:38 PM   Specimen: Blood  Result Value Ref Range Status   Specimen Description BLOOD RIGHT HAND  Final   Special Requests   Final    BOTTLES DRAWN AEROBIC AND ANAEROBIC Blood Culture results may not be optimal due to an inadequate volume of blood received in culture bottles Performed at [facility], [address] [place], [place] [address]    Culture PENDING  Incomplete   Report Status PENDING  Incomplete  MRSA PCR Screening     Status: None   Collection Time: 05/15/20 12:08 AM   Specimen: Nasal Mucosa; Nasopharyngeal  Result Value Ref Range Status   MRSA by PCR NEGATIVE NEGATIVE Final    Comment:        The GeneXpert MRSA Assay (FDA approved for NASAL specimens only), is one component of a comprehensive MRSA colonization surveillance program. It is not intended to diagnose MRSA infection nor to guide or monitor treatment for MRSA infections. Performed at [facility], [address] [place], [place] [address]     Radiology Reports EEG  Result Date: 04/26/2020 Lora Havens, MD     04/26/2020  8:41 PM Patient Name: DETROIT FRIEDEN MRN: [id number] Epilepsy Attending: Lora Havens Referring Physician/Provider: Ina Homes, PA Date: 04/26/2020 Duration: 25.30 mins Patient history: 79yo M with left frontal ICH. EEG to evaluate for seizue Level of alertness: Awake AEDs during EEG study: None Technical aspects: This EEG study was done with scalp electrodes positioned according to the 10-20 International  system of electrode placement. Electrical activity was acquired at a sampling rate of $Remov'500Hz'tlTNGH$  and reviewed with a high frequency filter of $RemoveB'70Hz'nfRKpupB$  and a low frequency filter of $RemoveB'1Hz'HGdgahvI$ . EEG data were recorded continuously and digitally stored. Description: No posterior dominant rhythm was seen. EEG showed continuous generalized and maximal left frontal region 3 to 6 Hz theta-delta slowing. Hyperventilation and photic stimulation were not performed.   ABNORMALITY -Continuous slow, generalized and maximal left frontal region IMPRESSION: This study is suggestive of cortical dysfunction in left frontal region consistent with underlying ICH. There is also moderate diffuse encephalopathy, nonspecific etiology. No seizures or epileptiform discharges were seen throughout the recording. Lora Havens   CT ANGIO HEAD W OR WO CONTRAST  Result Date: 04/27/2020 CLINICAL DATA:  Stroke/TIA, determine embolic source; Stroke/TIA, assess intracranial arteries EXAM: CT ANGIOGRAPHY HEAD AND NECK TECHNIQUE: Multidetector CT imaging of the head and neck was performed using the standard protocol during bolus administration of intravenous contrast. Multiplanar CT image reconstructions and MIPs were obtained to evaluate the vascular anatomy. Carotid stenosis measurements (when applicable) are obtained utilizing NASCET criteria, using the distal internal carotid diameter as the denominator. CONTRAST:  53mL OMNIPAQUE IOHEXOL 350 MG/ML SOLN COMPARISON:  04/27/2020 head CT and prior. FINDINGS: CTA NECK FINDINGS Aortic arch: Minimal aortic arch atherosclerotic calcifications. Standard branching. Patent great vessel origins. Right carotid system: Patent. Minimal bifurcation atheromatous disease with 20% proximal ICA luminal narrowing. Left carotid system: Patent. Vertebral arteries: Mild narrowing of the right vertebral artery origin secondary to calcified atheromatous plaque. Tortuous left V1 segment. Patent. Skeleton: No acute or suspicious  osseous abnormalities. Multilevel spondylosis. Other neck: No adenopathy.  No soft tissue mass. Upper chest: Dependent atelectasis.  Trace left pleural effusion. Review of the MIP images  confirms the above findings CTA HEAD FINDINGS Anterior circulation: Patent ICAs. Patent ACAs and MCAs. No significant stenosis, proximal occlusion, aneurysm, or vascular malformation. Posterior circulation: Patent PICA. Patent V4 segments and basilar artery. Fetal origin of the right PCA. Bilateral PCAs and proximal superior cerebellar arteries are patent. No significant stenosis, proximal occlusion, aneurysm, or vascular malformation. Venous sinuses: As permitted by contrast timing, patent. Anatomic variants: None. Left frontal intraparenchymal hemorrhage and minimal rightward midline shift, better evaluated on same day head CT. Review of the MIP images confirms the above findings IMPRESSION: No large vessel occlusion, high-grade narrowing, dissection or aneurysm. 20% proximal right ICA narrowing secondary to atheromatous disease. Mild right vertebral artery origin narrowing. Redemonstration of left frontal intraparenchymal hemorrhage, grossly unchanged. Electronically Signed   By: Stana Bunting M.D.   On: 04/27/2020 16:40   DG Chest 1 View  Result Date: 04/26/2020 CLINICAL DATA:  Altered mental status. EXAM: CHEST  1 VIEW COMPARISON:  03/20/2020. FINDINGS: The heart size and mediastinal contours are stable. Left subclavian approach cardiac rhythm maintenance device. Low lung volumes without focal consolidation. No visible pleural effusions or pneumothorax. No acute osseous abnormality. IMPRESSION: Low lung volumes without evidence of acute cardiopulmonary disease. Electronically Signed   By: Feliberto Harts MD   On: 04/26/2020 08:32   CT Head Wo Contrast  Result Date: 05/14/2020 CLINICAL DATA:  Intracranial hemorrhage, follow-up EXAM: CT HEAD WITHOUT CONTRAST TECHNIQUE: Contiguous axial images were obtained from  the base of the skull through the vertex without intravenous contrast. COMPARISON:  05/08/2020 FINDINGS: Brain: Continued evolution of left frontal parenchymal hemorrhage with further decreased size and density. Small volume residual hemorrhage layering in the occipital horns. Stable findings of chronic microvascular ischemic changes and parenchymal volume loss. Chronic right parasagittal frontal infarct. No new hemorrhage or loss of gray-white differentiation. Vascular: There is atherosclerotic calcification at the skull base. Skull: Calvarium is unremarkable. Sinuses/Orbits: No acute finding. Other: None. IMPRESSION: Expected evolution of left frontal parenchymal hemorrhage. Small volume residual intraventricular hemorrhage. No acute findings. Electronically Signed   By: Guadlupe Spanish M.D.   On: 05/14/2020 20:46   CT HEAD WO CONTRAST  Result Date: 05/08/2020 CLINICAL DATA:  Mental status changes of unknown cause. Previous intracranial hemorrhage. EXAM: CT HEAD WITHOUT CONTRAST TECHNIQUE: Contiguous axial images were obtained from the base of the skull through the vertex without intravenous contrast. COMPARISON:  04/30/2020 FINDINGS: Brain: Background pattern of chronic small vessel ischemic changes throughout the pons, thalami, basal ganglia and cerebral hemispheric white matter. Subacute intraparenchymal hematoma in the left frontal lobe is becoming less dense and contracting. No sign of any new or worsening hemorrhage in that region. Blood layering dependently in the lateral ventricles is becoming less dense and resolving. No sign of developing hydrocephalus. No extra-axial collection. Vascular: There is atherosclerotic calcification of the major vessels at the base of the brain. Skull: Negative Sinuses/Orbits: Clear/normal Other: None IMPRESSION: 1. No new or worsening finding. Subacute intraparenchymal hematoma in the left frontal lobe is becoming less dense and contracting. No sign of any new or  worsening hemorrhage in that region. Blood layering dependently in the lateral ventricles is becoming less dense and resolving. No sign of developing hydrocephalus. 2. Background pattern of chronic small vessel ischemic changes throughout the brain as outlined above. Electronically Signed   By: Paulina Fusi M.D.   On: 05/08/2020 13:22   CT HEAD WO CONTRAST  Result Date: 04/30/2020 CLINICAL DATA:  Intracranial hemorrhage. Follow-up ventricular size. EXAM: CT HEAD WITHOUT CONTRAST  TECHNIQUE: Contiguous axial images were obtained from the base of the skull through the vertex without intravenous contrast. COMPARISON:  CT head 04/27/2020 FINDINGS: Brain: Moderate to large hematoma left frontal lobe unchanged in size. There is mass-effect on the left frontal horn. There is intraventricular hemorrhage in the occipital pole bilaterally without progression. Mild improvement in ventricular blood on the left. Negative for hydrocephalus. 4 mm midline shift to the right unchanged. Extensive diffuse white matter hypodensity throughout both cerebral hemispheres most compatible with chronic microvascular ischemia. Vascular: Negative for hyperdense vessel Skull: Negative Sinuses/Orbits: Mild mucosal edema paranasal sinuses. NG tube in place. Negative orbit Other: None IMPRESSION: Moderate to large left frontal hematoma unchanged in size. Intraventricular hemorrhage slightly improved. No hydrocephalus. 4 mm midline shift to the right is unchanged. Electronically Signed   By: Franchot Gallo M.D.   On: 04/30/2020 13:21   CT HEAD WO CONTRAST  Result Date: 04/27/2020 CLINICAL DATA:  Parenchymal hemorrhage, follow-up left frontal ICH. EXAM: CT HEAD WITHOUT CONTRAST TECHNIQUE: Contiguous axial images were obtained from the base of the skull through the vertex without intravenous contrast. COMPARISON:  Head CT 04/26/2020. FINDINGS: Brain: Again demonstrated is multifocal intraparenchymal hemorrhage within the left frontal lobe. The  hemorrhages appear more confluent and more numerous. In conglomerate, the left frontal parenchymal hemorrhage now measure 6.6 x 2.9 x 4.8 cm (previously 6.6 x 2.9 x 4.7 cm, remeasured on prior). As before, there is partial effacement of the left lateral ventricle with 4 mm rightward midline shift. New from the prior examination, there is small volume intraventricular hemorrhage within the left greater than right occipital horns. No evidence of hydrocephalus. Unchanged scattered small volume subarachnoid hemorrhage, most notably along the anterior frontal lobes and right occipital lobe. Stable background generalized cerebral atrophy and chronic small vessel ischemic disease. No demarcated cortical infarct is identified. Vascular: No hyperdense vessel.  Atherosclerotic calcifications. Skull: Normal. Negative for fracture or focal lesion. Sinuses/Orbits: Visualized orbits show no acute finding. Mild ethmoid sinus mucosal thickening. No significant mastoid effusion. These results will be called to the ordering clinician or representative by the Radiologist Assistant, and communication documented in the PACS or [employer]. IMPRESSION: Left frontal lobe intraparenchymal hemorrhages appear more confluent and more numerous as compared to the exam of 04/26/2020. However, in conglomerate the hemorrhages have not significantly changed in size encompassing an area measuring 6.6 x 2.9 x 4.8 cm. Unchanged mass effect with partial effacement of the left lateral ventricle and 4 mm rightward midline shift. Small volume intraventricular hemorrhage within the left greater than right occipital horns, new from the prior examination. No evidence of hydrocephalus. Unchanged scattered small volume subarachnoid hemorrhage along the bilateral cerebral hemispheres, most notably along the anterior frontal lobes and right occipital lobe. Electronically Signed   By: Kellie Simmering DO   On: 04/27/2020 07:49   CT HEAD WO CONTRAST  Result  Date: 04/26/2020 CLINICAL DATA:  Altered mental status EXAM: CT HEAD WITHOUT CONTRAST TECHNIQUE: Contiguous axial images were obtained from the base of the skull through the vertex without intravenous contrast. COMPARISON:  05/29/2015 head CT and prior. FINDINGS: Brain: New focus of left frontal intraparenchymal hemorrhage measuring 5.8 x 2.9 cm (3:19) with peripheral edema. There is tracking of blood products along the anterior falx and bifrontal sulci. Rightward midline shift of 4 mm. Multiple discrete hyperdensities are seen within the left frontal lobe the largest of which measures 2.5 cm and demonstrates an internal fluid fluid level (3:20). Small volume subarachnoid hemorrhage also overlies  the occipital convexities. No intraventricular extension of hemorrhage. No ventriculomegaly. Mild cerebral atrophy with ex vacuo dilatation. Chronic microvascular ischemic changes. Vascular: No hyperdense vessel. Bilateral carotid siphon atherosclerotic calcifications. Skull: No acute fracture or focal lesion. Sinuses/Orbits: Normal orbits. Clear paranasal sinuses and mastoid air cells. Other: None. IMPRESSION: 5.8 cm left frontal intraparenchymal hemorrhage with 4 mm rightward midline shift. Multifocal left frontal hyperdensities some of which demonstrate internal fluid-fluid levels are suspicious for septic emboli. Consider MRI head with and without contrast for further evaluation. Small volume subdural and subarachnoid hemorrhage. These results were called by telephone at the time of interpretation on 04/26/2020 at 9:27 am to provider [facility] , who verbally acknowledged these results. Electronically Signed   By: Primitivo Gauze M.D.   On: 04/26/2020 09:39   CT ANGIO NECK W OR WO CONTRAST  Result Date: 04/27/2020 CLINICAL DATA:  Stroke/TIA, determine embolic source; Stroke/TIA, assess intracranial arteries EXAM: CT ANGIOGRAPHY HEAD AND NECK TECHNIQUE: Multidetector CT imaging of the head and neck was performed  using the standard protocol during bolus administration of intravenous contrast. Multiplanar CT image reconstructions and MIPs were obtained to evaluate the vascular anatomy. Carotid stenosis measurements (when applicable) are obtained utilizing NASCET criteria, using the distal internal carotid diameter as the denominator. CONTRAST:  52mL OMNIPAQUE IOHEXOL 350 MG/ML SOLN COMPARISON:  04/27/2020 head CT and prior. FINDINGS: CTA NECK FINDINGS Aortic arch: Minimal aortic arch atherosclerotic calcifications. Standard branching. Patent great vessel origins. Right carotid system: Patent. Minimal bifurcation atheromatous disease with 20% proximal ICA luminal narrowing. Left carotid system: Patent. Vertebral arteries: Mild narrowing of the right vertebral artery origin secondary to calcified atheromatous plaque. Tortuous left V1 segment. Patent. Skeleton: No acute or suspicious osseous abnormalities. Multilevel spondylosis. Other neck: No adenopathy.  No soft tissue mass. Upper chest: Dependent atelectasis.  Trace left pleural effusion. Review of the MIP images confirms the above findings CTA HEAD FINDINGS Anterior circulation: Patent ICAs. Patent ACAs and MCAs. No significant stenosis, proximal occlusion, aneurysm, or vascular malformation. Posterior circulation: Patent PICA. Patent V4 segments and basilar artery. Fetal origin of the right PCA. Bilateral PCAs and proximal superior cerebellar arteries are patent. No significant stenosis, proximal occlusion, aneurysm, or vascular malformation. Venous sinuses: As permitted by contrast timing, patent. Anatomic variants: None. Left frontal intraparenchymal hemorrhage and minimal rightward midline shift, better evaluated on same day head CT. Review of the MIP images confirms the above findings IMPRESSION: No large vessel occlusion, high-grade narrowing, dissection or aneurysm. 20% proximal right ICA narrowing secondary to atheromatous disease. Mild right vertebral artery origin  narrowing. Redemonstration of left frontal intraparenchymal hemorrhage, grossly unchanged. Electronically Signed   By: Primitivo Gauze M.D.   On: 04/27/2020 16:40   DG Chest Port 1 View  Result Date: 05/14/2020 CLINICAL DATA:  Change in mental status EXAM: PORTABLE CHEST 1 VIEW COMPARISON:  April 26, 2020 FINDINGS: The heart size and mediastinal contours are within normal limits. There is prominence of the central pulmonary vasculature. No large airspace consolidation or pleural effusion. A left-sided pacemaker seen with the lead tips in the right atrium and right ventricle. The visualized skeletal structures are unremarkable. IMPRESSION: Mild pulmonary vascular congestion. Electronically Signed   By: Prudencio Pair M.D.   On: 05/14/2020 18:11   DG Swallowing Func-Speech Pathology  Result Date: 05/10/2020 Objective Swallowing Evaluation: Type of Study: MBS-Modified Barium Swallow Study  Patient Details Name: ERVAN HEBER MRN: [id number] Date of Birth: 1941-06-03 Today's Date: 05/10/2020 Time: SLP Start Time (ACUTE ONLY): 3790 -SLP  Stop Time (ACUTE ONLY): 1210 SLP Time Calculation (min) (ACUTE ONLY): 15 min Past Medical History: Past Medical History: Diagnosis Date . Altered mental status 05/29/2015 . DVT (deep vein thrombosis) in pregnancy  . Hypertension  . Pulmonary embolism ([place])  . Sinus node dysfunction ([facility])   With pacemaker Past Surgical History: Past Surgical History: Procedure Laterality Date . INSERT / REPLACE / REMOVE PACEMAKER  ~ 2009  [employer] Versa VEDro1 . 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 Pt had recent E. Coli UTI and hospitalization  9/27- 10/1 for gout and cellulitis. Since, he has had a progressive decline, per H&P, pt had not been eating well and stopped  speaking on date of admission.  He was transferred to [facility] for further evaluation. EEG showed no electrographic seizures. CT head 11/4: Left frontal lobe intraparenchymal hemorrhages appear more confluent and more numerous as compared to the exam of 04/26/2020  Subjective: alert but not following commands Assessment / Plan / Recommendation CHL IP CLINICAL IMPRESSIONS 05/10/2020 Clinical Impression Pt was seen for MBS which revealed a mild oropharyngeal dysphagia characterized by delayed oral transit, premature spillage, and pharyngeal residue. Pt was observed to intermitently demonstrate delayed oral transit, primarily with thin and nectar thick liquids. He also demonstrated premature spillage of liquids that fell to the level of the pyriforms before the swallow was initiated. No instances of penetration or aspiration were observed across POs. Mild pharyngeal residue of puree was observed in the valleculae after the swallow. Recommend pt start dys 1 diet with thin liquids. Crush meds. Pt will require assist with feeding. SLP will f/u acutely for diet toleration.  SLP Visit Diagnosis Dysphagia, oropharyngeal phase (R13.12) Attention and concentration deficit following -- Frontal lobe and executive function deficit following -- Impact on safety and function Mild aspiration risk   CHL IP TREATMENT RECOMMENDATION 05/10/2020 Treatment Recommendations Therapy as outlined in treatment plan below   Prognosis 05/10/2020 Prognosis for Safe Diet Advancement Fair Barriers to Reach Goals Cognitive deficits;Severity of deficits Barriers/Prognosis Comment -- CHL IP DIET RECOMMENDATION 05/10/2020 SLP Diet Recommendations Dysphagia 1 (Puree) solids;Thin liquid Liquid Administration via Straw;Cup Medication Administration Crushed with puree Compensations -- Postural Changes Seated upright at 90 degrees   CHL IP OTHER RECOMMENDATIONS 05/10/2020 Recommended Consults -- Oral Care Recommendations Oral care BID Other Recommendations  --   CHL IP FOLLOW UP RECOMMENDATIONS 05/10/2020 Follow up Recommendations Inpatient Rehab   CHL IP FREQUENCY AND DURATION 05/10/2020 Speech Therapy Frequency (ACUTE ONLY) min 2x/week Treatment Duration 2 weeks      CHL IP ORAL PHASE 05/10/2020 Oral Phase Impaired Oral - Pudding Teaspoon -- Oral - Pudding Cup -- Oral - Honey Teaspoon -- Oral - Honey Cup -- Oral - Nectar Teaspoon -- Oral - Nectar Cup -- Oral - Nectar Straw Delayed oral transit;Premature spillage Oral - Thin Teaspoon -- Oral - Thin Cup -- Oral - Thin Straw Delayed oral transit;Premature spillage Oral - Puree Delayed oral transit Oral - Mech Soft -- Oral - Regular -- Oral - Multi-Consistency -- Oral - Pill -- Oral Phase - Comment --  CHL IP PHARYNGEAL PHASE 05/10/2020 Pharyngeal Phase Impaired Pharyngeal- Pudding Teaspoon -- Pharyngeal -- Pharyngeal- Pudding Cup -- Pharyngeal -- Pharyngeal- Honey Teaspoon -- Pharyngeal -- Pharyngeal- Honey Cup -- Pharyngeal -- Pharyngeal- Nectar Teaspoon --  Pharyngeal -- Pharyngeal- Nectar Cup -- Pharyngeal -- Pharyngeal- Nectar Straw Pharyngeal residue - valleculae Pharyngeal -- Pharyngeal- Thin Teaspoon -- Pharyngeal -- Pharyngeal- Thin Cup -- Pharyngeal -- Pharyngeal- Thin Straw Pharyngeal residue - valleculae Pharyngeal -- Pharyngeal- Puree Pharyngeal residue - valleculae Pharyngeal -- Pharyngeal- Mechanical Soft -- Pharyngeal -- Pharyngeal- Regular -- Pharyngeal -- Pharyngeal- Multi-consistency -- Pharyngeal -- Pharyngeal- Pill -- Pharyngeal -- Pharyngeal Comment --  No flowsheet data found. Harlon Ditty, MA CCC-SLP Acute Rehabilitation Services Pager [phone] Office [phone] Claudine Mouton 05/10/2020, 1:54 PM              ECHOCARDIOGRAM COMPLETE BUBBLE STUDY  Result Date: 04/26/2020    ECHOCARDIOGRAM REPORT   Patient Name:   MAURISIO RUDDY Date of Exam: 04/26/2020 Medical Rec #:  [id number]        Height:       66.0 in Accession #:    [id number]       Weight:       175.0 lb Date of Birth:   [birth year]       BSA:          1.890 m Patient Age:    78 years         BP:           129/96 mmHg Patient Gender: M                HR:           109 bpm. Exam Location:  Inpatient Procedure: 2D Echo, Intracardiac Opacification Agent and Saline Contrast Bubble            Study Indications:    Stroke 434.91 / I63.9  History:        Patient has prior history of Echocardiogram examinations, most                 recent 03/07/2020. Pacemaker, Arrythmias:Atrial Fibrillation and                 Sinus dysfunction; Risk Factors:Hypertension. History of DVT and                 PE.  Sonographer:    Leta Jungling RDCS Referring Phys: [phone] PAULA B SIMPSON IMPRESSIONS  1. Left ventricular ejection fraction, by estimation, is 55%. The left ventricle has low normal function. The left ventricle has no regional wall motion abnormalities. There is mild asymmetric left ventricular hypertrophy of the septal segment. Left ventricular diastolic parameters are indeterminate.  2. Right ventricular systolic function is mildly reduced. The right ventricular size is mildly enlarged. There is moderately elevated pulmonary artery systolic pressure. The estimated right ventricular systolic pressure is 47.3 mmHg.  3. Left atrial size was mildly dilated.  4. The mitral valve is normal in structure. Trivial mitral valve regurgitation. No evidence of mitral stenosis.  5. Tricuspid valve regurgitation is mild to moderate.  6. The aortic valve is grossly normal. There is mild calcification of the aortic valve. Aortic valve regurgitation is mild. No aortic stenosis is present.  7. Aortic dilatation noted. There is mild dilatation of the ascending aorta, measuring 42 mm.  8. The inferior vena cava is dilated in size with <50% respiratory variability, suggesting right atrial pressure of 15 mmHg. Conclusion(s)/Recommendation(s): No intracardiac source of embolism detected on this transthoracic study. A transesophageal echocardiogram is recommended to  exclude cardiac source of embolism if clinically indicated. FINDINGS  Left Ventricle: Left ventricular ejection fraction, by estimation, is 55%. The left ventricle  has low normal function. The left ventricle has no regional wall motion abnormalities. Definity contrast agent was given IV to delineate the left ventricular endocardial borders. The left ventricular internal cavity size was normal in size. There is mild asymmetric left ventricular hypertrophy of the septal segment. Left ventricular diastolic parameters are indeterminate. Right Ventricle: The right ventricular size is mildly enlarged. No increase in right ventricular wall thickness. Right ventricular systolic function is mildly reduced. There is moderately elevated pulmonary artery systolic pressure. The tricuspid regurgitant velocity is 2.84 m/s, and with an assumed right atrial pressure of 15 mmHg, the estimated right ventricular systolic pressure is 65.7 mmHg. Left Atrium: Left atrial size was mildly dilated. Right Atrium: Right atrial size was normal in size. Pericardium: There is no evidence of pericardial effusion. Mitral Valve: The mitral valve is normal in structure. Trivial mitral valve regurgitation. No evidence of mitral valve stenosis. Tricuspid Valve: The tricuspid valve is normal in structure. Tricuspid valve regurgitation is mild to moderate. No evidence of tricuspid stenosis. Aortic Valve: The aortic valve is grossly normal. There is mild calcification of the aortic valve. Aortic valve regurgitation is mild. Aortic regurgitation PHT measures 386 msec. No aortic stenosis is present. Pulmonic Valve: The pulmonic valve was not well visualized. Pulmonic valve regurgitation is mild. No evidence of pulmonic stenosis. Aorta: Aortic dilatation noted. There is mild dilatation of the ascending aorta, measuring 42 mm. Venous: The inferior vena cava is dilated in size with less than 50% respiratory variability, suggesting right atrial pressure of 15  mmHg. IAS/Shunts: No atrial level shunt detected by color flow Doppler. Agitated saline contrast was given intravenously to evaluate for intracardiac shunting. EKG: Rhythm strip during this exam demostrated atrial fibrillation. Additional Comments: A pacer wire is visualized in the right atrium and right ventricle.  LEFT VENTRICLE PLAX 2D LVIDd:         4.26 cm      Diastology LVIDs:         3.76 cm      LV e' medial:    7.51 cm/s LV PW:         0.67 cm      LV E/e' medial:  12.9 LV IVS:        1.07 cm      LV e' lateral:   12.53 cm/s LVOT diam:     2.30 cm      LV E/e' lateral: 7.8 LV SV:         50 LV SV Index:   26 LVOT Area:     4.15 cm  LV Volumes (MOD) LV vol d, MOD A2C: 112.0 ml LV vol d, MOD A4C: 109.0 ml LV vol s, MOD A2C: 53.4 ml LV vol s, MOD A4C: 64.0 ml LV SV MOD A2C:     58.6 ml LV SV MOD A4C:     109.0 ml LV SV MOD BP:      51.8 ml RIGHT VENTRICLE RV S prime:     8.81 cm/s TAPSE (M-mode): 1.6 cm LEFT ATRIUM             Index       RIGHT ATRIUM           Index LA diam:        3.00 cm 1.59 cm/m  RA Area:     14.60 cm LA Vol (A2C):   68.4 ml 36.20 ml/m RA Volume:   34.60 ml  18.31 ml/m LA Vol (A4C):   64.6 ml  34.19 ml/m LA Biplane Vol: 67.1 ml 35.51 ml/m  AORTIC VALVE LVOT Vmax:   69.85 cm/s LVOT Vmean:  49.000 cm/s LVOT VTI:    0.120 m AI PHT:      386 msec  AORTA Ao Root diam: 3.70 cm Ao Asc diam:  3.70 cm MITRAL VALVE               TRICUSPID VALVE MV Area (PHT): 4.65 cm    TR Peak grad:   32.3 mmHg MV Decel Time: 163 msec    TR Vmax:        284.00 cm/s MV E velocity: 97.13 cm/s                            SHUNTS                            Systemic VTI:  0.12 m                            Systemic Diam: 2.30 cm Cherlynn Kaiser MD Electronically signed by Cherlynn Kaiser MD Signature Date/Time: 04/26/2020/11:08:03 PM    Final    VAS [place] LOWER EXTREMITY VENOUS (DVT)  Result Date: 04/26/2020  Lower Venous DVT Study Other Indications: Follow up dvt. Comparison Study: 03/07/20 previous Performing  Technologist: Abram Sander RVS  Examination Guidelines: A complete evaluation includes B-mode imaging, spectral Doppler, color Doppler, and power Doppler as needed of all accessible portions of each vessel. Bilateral testing is considered an integral part of a complete examination. Limited examinations for reoccurring indications may be performed as noted. The reflux portion of the exam is performed with the patient in reverse Trendelenburg.  +---------+---------------+---------+-----------+----------+--------------+ RIGHT    CompressibilityPhasicitySpontaneityPropertiesThrombus Aging +---------+---------------+---------+-----------+----------+--------------+ CFV      Full           Yes      Yes                                 +---------+---------------+---------+-----------+----------+--------------+ SFJ      Full                                                        +---------+---------------+---------+-----------+----------+--------------+ FV Prox  Full                                                        +---------+---------------+---------+-----------+----------+--------------+ FV Mid   Full                                                        +---------+---------------+---------+-----------+----------+--------------+ FV DistalFull                                                        +---------+---------------+---------+-----------+----------+--------------+  PFV      Full                                                        +---------+---------------+---------+-----------+----------+--------------+ POP      Full           Yes      Yes                                 +---------+---------------+---------+-----------+----------+--------------+ PTV      Full                                                        +---------+---------------+---------+-----------+----------+--------------+ PERO     Full                                                         +---------+---------------+---------+-----------+----------+--------------+   +---------+---------------+---------+-----------+----------+--------------+ LEFT     CompressibilityPhasicitySpontaneityPropertiesThrombus Aging +---------+---------------+---------+-----------+----------+--------------+ CFV      Full           Yes      Yes                                 +---------+---------------+---------+-----------+----------+--------------+ SFJ      Full                                                        +---------+---------------+---------+-----------+----------+--------------+ FV Prox  Full                                                        +---------+---------------+---------+-----------+----------+--------------+ FV Mid   Full                                                        +---------+---------------+---------+-----------+----------+--------------+ FV DistalFull                                                        +---------+---------------+---------+-----------+----------+--------------+ PFV      Full                                                        +---------+---------------+---------+-----------+----------+--------------+  POP      Full           Yes      Yes                                 +---------+---------------+---------+-----------+----------+--------------+ PTV      Full                                                        +---------+---------------+---------+-----------+----------+--------------+ PERO     Full                                                        +---------+---------------+---------+-----------+----------+--------------+     Summary: BILATERAL: - No evidence of deep vein thrombosis seen in the lower extremities, bilaterally. - No evidence of superficial venous thrombosis in the lower extremities, bilaterally. -No evidence of popliteal cyst, bilaterally.   *See table(s) above  for measurements and observations. Electronically signed by Curt Jews MD on 04/26/2020 at 3:04:19 PM.    Final      CBC Recent Labs  Lab 05/11/20 0053 05/14/20 1733 05/15/20 0420  WBC 10.6* 13.6* 13.7*  HGB 14.1 17.5* 13.8  HCT 43.7 59.2* 48.8  PLT 281 269 157  MCV 91.6 102.2* 104.9*  MCH 29.6 30.2 29.7  MCHC 32.3 29.6* 28.3*  RDW 15.9* 17.9* 16.6*  LYMPHSABS  --  0.8  --   MONOABS  --  1.2*  --   EOSABS  --  0.0  --   BASOSABS  --  0.1  --     Chemistries  Recent Labs  Lab 05/11/20 0053 05/14/20 1733 05/14/20 2101 05/15/20 0051 05/15/20 0420  NA 146* 171* 168* 168* 169*  K 5.2* 4.5 4.6 3.8 3.8  CL 108 >130* >130* 133* >130*  CO2 $Re'25 22 24 23 23  'Gqc$ GLUCOSE 185* 135* 132* 132* 119*  BUN 111* 132* 130* 125* 123*  CREATININE 3.79* 4.09* 3.84* 3.35* 3.15*  CALCIUM 10.1 10.9* 10.0 9.8 9.8  AST  --  64*  --   --  44*  ALT  --  116*  --   --  81*  ALKPHOS  --  112  --   --  78  BILITOT  --  1.8*  --   --  1.3*   ------------------------------------------------------------------------------------------------------------------ No results for input(s): CHOL, HDL, LDLCALC, TRIG, CHOLHDL, LDLDIRECT in the last 72 hours.  Lab Results  Component Value Date   HGBA1C 5.8 (H) 04/26/2020   ------------------------------------------------------------------------------------------------------------------ No results for input(s): TSH, T4TOTAL, T3FREE, THYROIDAB in the last 72 hours.  Invalid input(s): FREET3 ------------------------------------------------------------------------------------------------------------------ No results for input(s): VITAMINB12, FOLATE, FERRITIN, TIBC, IRON, RETICCTPCT in the last 72 hours.  Coagulation profile Recent Labs  Lab 05/14/20 1733  INR 1.5*    No results for input(s): DDIMER in the last 72 hours.  Cardiac Enzymes No results for input(s): CKMB, TROPONINI, MYOGLOBIN in the last 168 hours.  Invalid input(s):  CK ------------------------------------------------------------------------------------------------------------------    Component Value Date/Time   BNP 76.0 05/14/2020 1749     Nelta Caudill M.D on 05/15/2020 at 11:23 AM  Go to [email or web address] - for contact info  [facility] - Office  [phone]

## 2020-05-15 NOTE — Progress Notes (Signed)
Dr. Robb Matar paged via Amion to notify of critical lab values: Na 169, Chloride >130, and Troponin 350.

## 2020-05-15 NOTE — Progress Notes (Signed)
Palliative: Thank you for this consult. Unfortunately due to high volume of consults there will be a delay in a Palliative Provider seeing this patient. Palliative Medicine will return to service on 05/16/2020 and will see patient at that time.  No charge Lizzett Nobile, NP Palliative Medicine Please call Palliative Medicine team phone with any questions [phone]. For individual providers please see AMION.  

## 2020-05-15 NOTE — Progress Notes (Signed)
CRITICAL VALUE ALERT  Critical Value:  Sodium 164 & Chloride > 130  Date & Time Notied:  05/15/20 @ 1822.  Provider Notified: Courage, MD.  Orders Received/Actions taken: Awaiting new orders.

## 2020-05-15 NOTE — Progress Notes (Signed)
MD aware of afib with HR between 115-140s with soft SBP at this time; no new orders at this time.

## 2020-05-16 ENCOUNTER — Encounter (HOSPITAL_COMMUNITY): Payer: Self-pay | Admitting: Internal Medicine

## 2020-05-16 DIAGNOSIS — A419 Sepsis, unspecified organism: Secondary | ICD-10-CM | POA: Diagnosis not present

## 2020-05-16 DIAGNOSIS — Z7189 Other specified counseling: Secondary | ICD-10-CM | POA: Diagnosis not present

## 2020-05-16 DIAGNOSIS — E870 Hyperosmolality and hypernatremia: Secondary | ICD-10-CM | POA: Diagnosis not present

## 2020-05-16 DIAGNOSIS — Z515 Encounter for palliative care: Secondary | ICD-10-CM | POA: Diagnosis not present

## 2020-05-16 LAB — BASIC METABOLIC PANEL WITH GFR
Anion gap: 8 (ref 5–15)
BUN: 82 mg/dL — ABNORMAL HIGH (ref 8–23)
CO2: 24 mmol/L (ref 22–32)
Calcium: 9.3 mg/dL (ref 8.9–10.3)
Chloride: 130 mmol/L — ABNORMAL HIGH (ref 98–111)
Creatinine, Ser: 2.06 mg/dL — ABNORMAL HIGH (ref 0.61–1.24)
GFR, Estimated: 32 mL/min — ABNORMAL LOW
Glucose, Bld: 151 mg/dL — ABNORMAL HIGH (ref 70–99)
Potassium: 3.9 mmol/L (ref 3.5–5.1)
Sodium: 162 mmol/L (ref 135–145)

## 2020-05-16 LAB — BASIC METABOLIC PANEL
Anion gap: 6 (ref 5–15)
Anion gap: 9 (ref 5–15)
BUN: 101 mg/dL — ABNORMAL HIGH (ref 8–23)
BUN: 92 mg/dL — ABNORMAL HIGH (ref 8–23)
BUN: 68 mg/dL — ABNORMAL HIGH (ref 8–23)
BUN: 72 mg/dL — ABNORMAL HIGH (ref 8–23)
CO2: 20 mmol/L — ABNORMAL LOW (ref 22–32)
CO2: 24 mmol/L (ref 22–32)
CO2: 23 mmol/L (ref 22–32)
CO2: 22 mmol/L (ref 22–32)
Calcium: 9.2 mg/dL (ref 8.9–10.3)
Calcium: 9.4 mg/dL (ref 8.9–10.3)
Calcium: 8.3 mg/dL — ABNORMAL LOW (ref 8.9–10.3)
Calcium: 9 mg/dL (ref 8.9–10.3)
Chloride: >130 mmol/L (ref 98–111)
Chloride: >130 mmol/L (ref 98–111)
Chloride: 115 mmol/L — ABNORMAL HIGH (ref 98–111)
Chloride: 129 mmol/L — ABNORMAL HIGH (ref 98–111)
Creatinine, Ser: 2.31 mg/dL — ABNORMAL HIGH (ref 0.61–1.24)
Creatinine, Ser: 2.13 mg/dL — ABNORMAL HIGH (ref 0.61–1.24)
Creatinine, Ser: 1.75 mg/dL — ABNORMAL HIGH (ref 0.61–1.24)
Creatinine, Ser: 1.68 mg/dL — ABNORMAL HIGH (ref 0.61–1.24)
GFR, Estimated: 28 mL/min — ABNORMAL LOW (ref >60)
GFR, Estimated: 31 mL/min — ABNORMAL LOW (ref >60)
GFR, Estimated: 39 mL/min — ABNORMAL LOW (ref >60)
GFR, Estimated: 41 mL/min — ABNORMAL LOW (ref >60)
Glucose, Bld: 128 mg/dL — ABNORMAL HIGH (ref 70–99)
Glucose, Bld: 121 mg/dL — ABNORMAL HIGH (ref 70–99)
Glucose, Bld: 554 mg/dL (ref 70–99)
Glucose, Bld: 143 mg/dL — ABNORMAL HIGH (ref 70–99)
Potassium: 3.4 mmol/L — ABNORMAL LOW (ref 3.5–5.1)
Potassium: 3.3 mmol/L — ABNORMAL LOW (ref 3.5–5.1)
Potassium: 3.4 mmol/L — ABNORMAL LOW (ref 3.5–5.1)
Potassium: 4 mmol/L (ref 3.5–5.1)
Sodium: 163 mmol/L (ref 135–145)
Sodium: 165 mmol/L (ref 135–145)
Sodium: 144 mmol/L (ref 135–145)
Sodium: 160 mmol/L — ABNORMAL HIGH (ref 135–145)

## 2020-05-16 LAB — GLUCOSE, CAPILLARY
Glucose-Capillary: 100 mg/dL — ABNORMAL HIGH (ref 70–99)
Glucose-Capillary: 114 mg/dL — ABNORMAL HIGH (ref 70–99)
Glucose-Capillary: 115 mg/dL — ABNORMAL HIGH (ref 70–99)
Glucose-Capillary: 118 mg/dL — ABNORMAL HIGH (ref 70–99)
Glucose-Capillary: 139 mg/dL — ABNORMAL HIGH (ref 70–99)
Glucose-Capillary: 150 mg/dL — ABNORMAL HIGH (ref 70–99)

## 2020-05-16 MED ORDER — METOPROLOL TARTRATE 5 MG/5ML IV SOLN
5.0000 mg | Freq: Three times a day (TID) | INTRAVENOUS | Status: DC
  Administered 2020-05-16 – 2020-05-20 (×11): 5 mg via INTRAVENOUS
  Filled 2020-05-16 (×12): qty 5

## 2020-05-16 MED ORDER — POTASSIUM CHLORIDE 10 MEQ/100ML IV SOLN
10.0000 meq | INTRAVENOUS | Status: AC
  Administered 2020-05-16 (×4): 10 meq via INTRAVENOUS
  Filled 2020-05-16 (×4): qty 100

## 2020-05-16 NOTE — Consult Note (Signed)
Consultation Note Date: 05/16/2020   Patient Name: Luke Crosby  DOB: [birth year]  MRN: [id number]  Age / Sex: 79 y.o., male  PCP: Gareth Morgan, MD Referring Physician: Shon Hale, MD  Reason for Consultation: Establishing goals of care  HPI/Patient Profile: 79 y.o. male  with past medical history of bilateral pulmonary embolism and right lower extremity DVT in September 2021, sinus node dysfunction, history of pacemaker placement, proximal A. fib, HTN, gout, laparoscopic cholecystectomy, admitted on 05/14/2020 with severe hypernatremia with significant acute metabolic encephalopathy and lethargy, acute kidney injury, sepsis due to [facility] UTI.   Clinical Assessment and Goals of Care: I have reviewed medical records including EPIC notes, labs and imaging, received report from bedside nursing staff, examined the patient.  Mr. Koeppen is lying quietly in bed.  He appears acutely/chronically ill and quite frail.  He does not attempt to respond to my questions, is unable to make his basic needs known.  There is no family at bedside at this time.  Call to son/healthcare surrogate, Luke Crosby to discuss diagnosis prognosis, GOC, EOL wishes, disposition and options.  I introduced Palliative Medicine as specialized medical care for people living with serious illness. It focuses on providing relief from the symptoms and stress of a serious illness.   We discussed a brief life review of the patient.  Mr. Caudill wife died about 7 years ago.  He also had a daughter who died.  He lives in his own home with his son Luke Crosby, his son-in-law, grandson, and 2 nephews present to provide around-the-clock care.  As far as functional and nutritional status, per Luke Crosby, Mr. Stilley was "not sick" and was "all right" when he discharged from the hospital after his stroke.  He states that family is prepared to care for him  at home.  His brother-in-law is a Engineer, civil (consulting) and his niece is a Engineer, civil (consulting), and they know how to care for him.  I ask if Mr. Valdes granddaughter is an LPN/RN, and he tells me that she knows "everything" and is able to check Mr. Satz blood pressure.  I do not further investigate their ability or knowledge at this point.  We talked about the treatment plan including, but not limited to IV fluids, medications, selected labs.  Luke Crosby seems knowledgeable about sodium levels and the need to bring these down slowly.  We discussed current illness and what it means in the larger context of on-going co-morbidities.  Natural disease trajectory and expectations at EOL were discussed.  I share my worry about 4 hospital stays and 3 ED visits in the last 6 months.  I share that I am worried Mr. Sweeney would not have a fully meaningful recovery.  I ask Luke Crosby if that would surprise him and he states, "well, yeah".  I attempted to elicit values and goals of care important to the patient.  Luke Crosby states that his father would never want to live in a nursing home, has made this clear.  Luke Crosby states that he believes his father would rather  pass away then to have to live in a nursing home.  He shares that Mr. Bothwell wife suffered living in a nursing home.  The difference between aggressive medical intervention and comfort care was considered in light of the patient's goals of care.   Questions and concerns were addressed.   PMT to continue to follow.    Conference with attending, bedside nursing staff, transition of care team related to patient condition, needs, goals of care.  HCPOA   NEXT OF KIN -son, Luke Crosby    SUMMARY OF RECOMMENDATIONS   Time for outcomes Considering the "what if's" if Mr. Hendricksen is unable to have meaningful recovery Son Luke Crosby states that he would never want to live his life in a nursing home  Code Status/Advance Care Planning:  DNR   Symptom Management:   Per hospitalist,  no additional needs at this time.  Palliative Prophylaxis:   Frequent Pain Assessment, Oral Care and Turn Reposition  Additional Recommendations (Limitations, Scope, Preferences):  Treat the treatable but no CPR or intubation.  Psycho-social/Spiritual:   Desire for further Chaplaincy support:no  Additional Recommendations: Caregiving  Support/Resources and Education on Hospice  Prognosis:  Unable to determine, based on outcomes.  6 months or less would not be surprising based on 4 hospital visits and 3 ED visits in the last 6 months, chronic illness burden, frailty.  Discharge Planning: To be determined, based on outcomes.      Primary Diagnoses: Present on Admission: . Hypernatremia . Essential hypertension . PAF (paroxysmal atrial fibrillation) (HCC) . AKI (acute kidney injury) (HCC) . Abnormal LFTs . Lactic acidosis . History of pulmonary embolus (PE) . Elevated troponin . Sepsis due to undetermined organism (HCC) . ICH (intracerebral hemorrhage) (HCC)   I have reviewed the medical record, interviewed the patient and family, and examined the patient. The following aspects are pertinent.  Past Medical History:  Diagnosis Date  . Altered mental status 05/29/2015  . DVT (deep vein thrombosis) in pregnancy   . Hypertension   . Pulmonary embolism (HCC)   . Sinus node dysfunction (HCC)    With pacemaker   Social History   Socioeconomic History  . Marital status: Widowed    Spouse name: Not on file  . Number of children: Not on file  . Years of education: Not on file  . Highest education level: Not on file  Occupational History  . Not on file  Tobacco Use  . Smoking status: Never Smoker  . Smokeless tobacco: Former Neurosurgeon    Types: Chew  . Tobacco comment: 05/30/2015 "chewed tobacco for 40 yrs; quit early 2000's"  Substance and Sexual Activity  . Alcohol use: No  . Drug use: No  . Sexual activity: Not Currently  Other Topics Concern  . Not on file  Social  History Narrative  . Not on file   Social Determinants of Health   Financial Resource Strain:   . Difficulty of Paying Living Expenses: Not on file  Food Insecurity:   . Worried About Programme researcher, broadcasting/film/video in the Last Year: Not on file  . Ran Out of Food in the Last Year: Not on file  Transportation Needs:   . Lack of Transportation (Medical): Not on file  . Lack of Transportation (Non-Medical): Not on file  Physical Activity:   . Days of Exercise per Week: Not on file  . Minutes of Exercise per Session: Not on file  Stress:   . Feeling of Stress : Not on file  Social Connections:   . Frequency of Communication with Friends and Family: Not on file  . Frequency of Social Gatherings with Friends and Family: Not on file  . Attends Religious Services: Not on file  . Active Member of Clubs or Organizations: Not on file  . Attends Banker Meetings: Not on file  . Marital Status: Not on file   Family History  Problem Relation Age of Onset  . Hypertension Sister    Scheduled Meds: . Chlorhexidine Gluconate Cloth  6 each Topical Daily  . mouth rinse  15 mL Mouth Rinse BID  . metoprolol tartrate  2.5 mg Intravenous Q8H   Continuous Infusions: . ceFEPime (MAXIPIME) IV 2 g (05/15/20 1815)  . dextrose 125 mL/hr at 05/16/20 0820  . potassium chloride 10 mEq (05/16/20 0913)  . vancomycin     PRN Meds:.acetaminophen **OR** acetaminophen, ondansetron **OR** ondansetron (ZOFRAN) IV Medications Prior to Admission:  Prior to Admission medications   Medication Sig Start Date End Date Taking? Authorizing Provider  colchicine 0.6 MG tablet Take 1 tablet (0.6 mg total) by mouth 2 (two) times daily. 03/24/20  Yes Meredeth Ide, MD  amoxicillin (AMOXIL) 500 MG capsule Take 500 mg by mouth 3 (three) times daily. Patient not taking: Reported on 05/14/2020 04/17/20   [provider]   No Known Allergies Review of Systems  Unable to perform ROS: Acuity of condition     Physical Exam Vitals and nursing note reviewed.  Constitutional:      General: He is not in acute distress.    Appearance: He is ill-appearing.  HENT:     Head: Normocephalic and atraumatic.  Cardiovascular:     Rate and Rhythm: Normal rate.  Pulmonary:     Effort: Pulmonary effort is normal. No respiratory distress.  Abdominal:     General: Abdomen is flat.     Palpations: Abdomen is soft.  Skin:    General: Skin is warm and dry.  Neurological:     Comments: Does not respond verbally to voice or touch, closes eyes and looks away     Vital Signs: BP (!) 102/58   Pulse (!) 52   Temp (!) 100.8 F (38.2 C) (Bladder)   Resp 14   Ht 5\' 8"  (1.727 m)   Wt 65.9 kg   SpO2 98%   BMI 22.09 kg/m  Pain Scale: PAINAD       SpO2: SpO2: 98 % O2 Device:SpO2: 98 % O2 Flow Rate: .O2 Flow Rate (L/min): 2 L/min  IO: Intake/output summary:   Intake/Output Summary (Last 24 hours) at 05/16/2020 05/18/2020 Last data filed at 05/16/2020 0700 Gross per 24 hour  Intake 2160.43 ml  Output 1750 ml  Net 410.43 ml    LBM: Last BM Date:  (UTA) Baseline Weight: Weight: 68 kg Most recent weight: Weight: 65.9 kg     Palliative Assessment/Data:   Flowsheet Rows     Most Recent Value  Intake Tab  Referral Department Hospitalist  Unit at Time of Referral Intermediate Care Unit  Palliative Care Primary Diagnosis Other (Comment)  Date Notified 05/15/20  Palliative Care Type New Palliative care  Reason for referral Clarify Goals of Care  Date of Admission 05/14/20  Date first seen by Palliative Care 05/16/20  # of days Palliative referral response time 1 Day(s)  # of days IP prior to Palliative referral 1  Clinical Assessment  Palliative Performance Scale Score 30%  Pain Max last 24 hours Not able to report  Pain Min Last 24 hours Not able to report  Dyspnea Max Last 24 Hours Not able to report  Dyspnea Min Last 24 hours Not able to report  Psychosocial & Spiritual Assessment   Palliative Care Outcomes      Time In: 0820 Time Out: 0930 Time Total: 70 minutes Greater than 50%  of this time was spent counseling and coordinating care related to the above assessment and plan.  Signed by: Katheran Aweasha A Lavona Norsworthy, NP   Please contact Palliative Medicine Team phone at [phone] for questions and concerns.  For individual provider: See Loretha StaplerAmion

## 2020-05-16 NOTE — Progress Notes (Signed)
CRITICAL VALUE ALERT  Critical Value:  Na+ 162  Date & Time Notied:  05/16/2020 1227  Provider Notified: Hampton Abbot, MD  Orders Received/Actions taken: MD aware

## 2020-05-16 NOTE — Progress Notes (Addendum)
Patient Demographics:    Luke Crosby, is a 79 y.o. male, DOB - 1940-10-06, HDI:[id number]  Admit date - 05/14/2020   Admitting Physician Reubin Milan, MD  Outpatient Primary MD for the patient is Lemmie Evens, MD  LOS - 2   Chief Complaint  Patient presents with  . Altered Mental Status        Subjective:    Luke Crosby today is able to open his eyes, not really verbal   Assessment  & Plan :    Principal Problem:    Sepsis due to [facility] UTI Active Problems:   ICH (intracerebral hemorrhage) (HCC)   Severe Hypernatremia   AKI (acute kidney injury) ([place])   Essential hypertension   PAF (paroxysmal atrial fibrillation) (HCC)   Abnormal LFTs   Lactic acidosis   History of pulmonary embolus (PE)   Elevated troponin  Brief Summary:-  79 y.o. male with medical history significant of bilateral pulmonary embolism and RLE DVT in September 2021, fibrillation with sinus node dysfunction, history of pacemaker placement, HTN and  gout who was admitted to [facility] from 04/26/2020 until 05/13/2020 due to intracranial hemorrhage secondary to mechanical fall with head injury while anticoagulated with Eliquis for A. fib and DVT/ PE --- patient was DC'd home on 05/13/2020 with home health services after insurance refused to pay for inpatient rehab and son was not happy with patient going to SNF rehab ---- ---patient returns to  [facility] on 05/14/2020 with worsening LOC and found to be severely dehydrated with a sodium of 171 and chloride over 130 with a creatinine of 4.0,  -Patient has Now ruled in for sepsis due to [facility] UTI  A/p 1)Severe Hypernatremia with significant acute metabolic encephalopathy and lethargy -Admission sodium 171, sodium down to 165  C/n IV fluid - dextrose water -Check BMP every 6 hours and adjust fluid composition and rate accordingly -Avoid over aggressive correction -Suspect  significant free water deficit due to decreased oral intake in the setting of recent intracranial hemorrhage -Patient is still not awake enough to tolerate oral intake  2)AKI----acute kidney injury due to severe dehydration -  creatinine on admission= 4.0  , baseline creatinine = 0.9 (05/03/20)    , --Creatinine continues to trend down with hydration,  creatinine is now= 2.1  ,  renally adjust medications, avoid nephrotoxic agents / dehydration  / hypotension -Continue to hydrate iv  3)Traumatic, Left frontal intraparenchymal hemorrhage, small volume SDH/ SAH----date of injury 04/26/2020 - s/p reversal with Kcentra and vitamin K -Seen and evaluated by neurosurgery, no surgical intervention advised -Repeat CT head on 05/14/2020 noted  4)Chronic A. fib with RVR-- CHA2DS2-=VASc score 3, HAS-BLED score 3 --no further anticoagulation due to traumatic ICH -Okay to use IV metoprolol with parameters for rate control, patient unable to take oral intake at this time --  5)PE with extensive RLE DVT--- diagnosed 03/06/2020 -- -- Eliquis discontinued due to traumatic ICH  6)Elevated LFTs--setting of hypotension and possible shock liver, -T bili is down to 1.3 from 1.8 -ALT down to 81 from 116 -AST is down to 44 from 64  7)Sepsis due to [place] UTI--- met SIRS criteria on admission, lactic acid was elevated,--- - --patient has Now ruled in for sepsis with urine culture  from 05/14/2020 growing GPC --Patient with fevers and leukocytosis as well as tachypnea and tachycardia --Currently on Vanco and cefepime pending further culture data -Continue  IV hydration  8) acute metabolic encephalopathy--- secondary to #1, #2 #3 and #7 above  9)Social/Ethics--plan of care discussed with patient's son, patient is a DNR -Palliative care consult for goals of care requested -Overall prognosis is pretty poor  10)Hyperchloremia and Hypokalemia--- potassium replaced IV, chloride should improve with further  hydration  Disposition/Need for in-Hospital Stay- patient unable to be discharged at this time due to --severe hypernatremia and dehydration with AKI as well as  acute metabolic encephalopathy requiring IV fluids until patient is awake enough to take oral liquids, as well as GPC sepsis requiring IV antibiotics  Status is: Inpatient  Remains inpatient appropriate because:severe hypernatremia and dehydration with AKI as well as  acute metabolic encephalopathy requiring IV fluids until patient is awake enough to take oral liquids, as well as GPC sepsis requiring IV antibiotics   Disposition: The patient is from: Home              Anticipated d/c is to: TBD---?? Hospice Vs SNF              Anticipated d/c date is: > 3 days              Patient currently is not medically stable to d/c. Barriers: Not Clinically Stable- -severe hypernatremia and dehydration with AKI as well as  acute metabolic encephalopathy requiring IV fluids until patient is awake enough to take oral liquids, as well as GPC sepsis requiring IV antibiotics  Code Status : DNR  Family Communication:   Discussed with son   Consults  :  palliative  DVT Prophylaxis  :   - SCDs   Lab Results  Component Value Date   PLT 157 05/15/2020    Inpatient Medications  Scheduled Meds: . Chlorhexidine Gluconate Cloth  6 each Topical Daily  . mouth rinse  15 mL Mouth Rinse BID  . metoprolol tartrate  2.5 mg Intravenous Q8H   Continuous Infusions: . ceFEPime (MAXIPIME) IV 2 g (05/15/20 1815)  . dextrose 125 mL/hr at 05/16/20 0820  . potassium chloride 10 mEq (05/16/20 0913)  . vancomycin     PRN Meds:.acetaminophen **OR** acetaminophen, ondansetron **OR** ondansetron (ZOFRAN) IV  Anti-infectives (From admission, onward)   Start     Dose/Rate Route Frequency Ordered Stop   05/16/20 1800  vancomycin (VANCOREADY) IVPB 750 mg/150 mL        750 mg 150 mL/hr over 60 Minutes Intravenous Every 48 hours 05/14/20 1842     05/15/20 1800   ceFEPIme (MAXIPIME) 2 g in sodium chloride 0.9 % 100 mL IVPB       Note to Pharmacy: Adjust dosage as needed please. TY.   2 g 200 mL/hr over 30 Minutes Intravenous Every 24 hours 05/14/20 2325     05/15/20 1200  Ampicillin-Sulbactam (UNASYN) 3 g in sodium chloride 0.9 % 100 mL IVPB  Status:  Discontinued        3 g 200 mL/hr over 30 Minutes Intravenous Every 24 hours 05/14/20 2055 05/15/20 1408   05/14/20 1800  ceFEPIme (MAXIPIME) 2 g in sodium chloride 0.9 % 100 mL IVPB        2 g 200 mL/hr over 30 Minutes Intravenous  Once 05/14/20 1752 05/14/20 1854   05/14/20 1800  Ampicillin-Sulbactam (UNASYN) 3 g in sodium chloride 0.9 % 100 mL IVPB  Status:  Discontinued  3 g 200 mL/hr over 30 Minutes Intravenous Every 6 hours 05/14/20 1752 05/14/20 2055   05/14/20 1800  vancomycin (VANCOCIN) IVPB 1000 mg/200 mL premix  Status:  Discontinued        1,000 mg 200 mL/hr over 60 Minutes Intravenous  Once 05/14/20 1752 05/14/20 1755   05/14/20 1800  vancomycin (VANCOREADY) IVPB 1500 mg/300 mL        1,500 mg 150 mL/hr over 120 Minutes Intravenous  Once 05/14/20 1755 05/14/20 2031        Objective:   Vitals:   05/16/20 0500 05/16/20 0600 05/16/20 0700 05/16/20 0715  BP: 131/76 118/65 (!) 102/58 (!) 102/58  Pulse: (!) 128 (!) 111 93 (!) 52  Resp: (!) 26 19 (!) 41 14  Temp: (!) 100.6 F (38.1 C) (!) 100.6 F (38.1 C) (!) 100.8 F (38.2 C) (!) 100.8 F (38.2 C)  TempSrc:    Bladder  SpO2: 93% 93% 96% 98%  Weight:      Height:        Wt Readings from Last 3 Encounters:  05/15/20 65.9 kg  05/13/20 68 kg  03/20/20 74 kg     Intake/Output Summary (Last 24 hours) at 05/16/2020 0939 Last data filed at 05/16/2020 0700 Gross per 24 hour  Intake 2160.43 ml  Output 1750 ml  Net 410.43 ml     Physical Exam  Gen:-Remains lethargic and mostly nonverbal  lungs-diminished breath sounds, no wheezing CV- S1, S2 normal, irregularly irregular, tachycardic Abd-  +ve B.Sounds, Abd Soft,  No tenderness,    Extremity/Skin:- No  edema, pedal pulses present  Neuro-Psych-very very limited exam due to  lethargy and unresponsiveness GU--Foley catheter in situ   Data Review:   Micro Results Recent Results (from the past 240 hour(s))  Resp Panel by RT-PCR (Flu A&B, Covid) Nasopharyngeal Swab     Status: None   Collection Time: 05/14/20  5:37 PM   Specimen: Nasopharyngeal Swab; Nasopharyngeal(NP) swabs in vial transport medium  Result Value Ref Range Status   SARS Coronavirus 2 by RT PCR NEGATIVE NEGATIVE Final    Comment: (NOTE) SARS-CoV-2 target nucleic acids are NOT DETECTED.  The SARS-CoV-2 RNA is generally detectable in upper respiratory specimens during the acute phase of infection. The lowest concentration of SARS-CoV-2 viral copies this assay can detect is 138 copies/mL. A negative result does not preclude SARS-Cov-2 infection and should not be used as the sole basis for treatment or other patient management decisions. A negative result may occur with  improper specimen collection/handling, submission of specimen other than nasopharyngeal swab, presence of viral mutation(s) within the areas targeted by this assay, and inadequate number of viral copies(<138 copies/mL). A negative result must be combined with clinical observations, patient history, and epidemiological information. The expected result is Negative.  Fact Sheet for Patients:  EntrepreneurPulse.com.au  Fact Sheet for Healthcare Providers:  IncredibleEmployment.be  This test is no t yet approved or cleared by the [place] FDA and  has been authorized for detection and/or diagnosis of SARS-CoV-2 by FDA under an Emergency Use Authorization (EUA). This EUA will remain  in effect (meaning this test can be used) for the duration of the COVID-19 declaration under Section 564(b)(1) of the Act, 21 U.S.C.section 360bbb-3(b)(1), unless the authorization is terminated  or  revoked sooner.       Influenza A by PCR NEGATIVE NEGATIVE Final   Influenza B by PCR NEGATIVE NEGATIVE Final    Comment: (NOTE) The Xpert Xpress SARS-CoV-2/FLU/RSV plus assay is  intended as an aid in the diagnosis of influenza from Nasopharyngeal swab specimens and should not be used as a sole basis for treatment. Nasal washings and aspirates are unacceptable for Xpert Xpress SARS-CoV-2/FLU/RSV testing.  Fact Sheet for Patients: EntrepreneurPulse.com.au  Fact Sheet for Healthcare Providers: IncredibleEmployment.be  This test is not yet approved or cleared by the [place] FDA and has been authorized for detection and/or diagnosis of SARS-CoV-2 by FDA under an Emergency Use Authorization (EUA). This EUA will remain in effect (meaning this test can be used) for the duration of the COVID-19 declaration under Section 564(b)(1) of the Act, 21 U.S.C. section 360bbb-3(b)(1), unless the authorization is terminated or revoked.  Performed at [facility], [address] [place], [place] [address]   Culture, blood (Routine x 2)     Status: None (Preliminary result)   Collection Time: 05/14/20  5:38 PM   Specimen: BLOOD RIGHT HAND  Result Value Ref Range Status   Specimen Description BLOOD RIGHT HAND  Final   Special Requests   Final    BOTTLES DRAWN AEROBIC AND ANAEROBIC Blood Culture results may not be optimal due to an inadequate volume of blood received in culture bottles   Culture   Final    NO GROWTH 2 DAYS Performed at [facility], [address], [place], [place] [address]    Report Status PENDING  Incomplete  Culture, blood (Routine x 2)     Status: None (Preliminary result)   Collection Time: 05/14/20  5:40 PM   Specimen: BLOOD LEFT FOREARM  Result Value Ref Range Status   Specimen Description   Final    BLOOD LEFT FOREARM BOTTLES DRAWN AEROBIC AND ANAEROBIC Blood Culture results may not be optimal due to an inadequate volume of  blood received in culture bottles   Special Requests NONE  Final   Culture   Final    NO GROWTH 2 DAYS Performed at [facility], [address], [place], [place] [address]    Report Status PENDING  Incomplete  Urine culture     Status: Abnormal (Preliminary result)   Collection Time: 05/14/20  5:49 PM   Specimen: In/Out Cath Urine  Result Value Ref Range Status   Specimen Description   Final    IN/OUT CATH URINE Performed at [facility], [address], [place], [place] [address]    Special Requests   Final    NONE Performed at [facility], [address], [place], [place] [address]    Culture (A)  Final    >=100,000 COLONIES/mL GRAM POSITIVE COCCI IDENTIFICATION TO FOLLOW Performed at [facility], [place] [address], [place], [place] [address]    Report Status PENDING  Incomplete  MRSA PCR Screening     Status: None   Collection Time: 05/15/20 12:08 AM   Specimen: Nasal Mucosa; Nasopharyngeal  Result Value Ref Range Status   MRSA by PCR NEGATIVE NEGATIVE Final    Comment:        The GeneXpert MRSA Assay (FDA approved for NASAL specimens only), is one component of a comprehensive MRSA colonization surveillance program. It is not intended to diagnose MRSA infection nor to guide or monitor treatment for MRSA infections. Performed at [facility], [address], [place], [place] [address]     Radiology Reports EEG  Result Date: 04/26/2020 Lora Havens, MD     04/26/2020  8:41 PM Patient Name: MIKLE STERNBERG MRN: [id number] Epilepsy Attending: Lora Havens Referring Physician/Provider: Ina Homes, PA  Date: 04/26/2020 Duration: 25.30 mins Patient history: 79yo M with left frontal ICH. EEG to evaluate for seizue Level of alertness: Awake AEDs during EEG study: None Technical aspects: This EEG study was done with scalp electrodes positioned according to the 10-20 International system of electrode placement. Electrical activity was acquired at a sampling rate of  _0  and reviewed with a high frequency filter of _1  and a low frequency filter of _2 . EEG data were recorded continuously and digitally stored. Description: No posterior dominant rhythm was seen. EEG showed continuous generalized and maximal left frontal region 3 to 6 Hz theta-delta slowing. Hyperventilation and photic stimulation were not performed.   ABNORMALITY -Continuous slow, generalized and maximal left frontal region IMPRESSION: This study is suggestive of cortical dysfunction in left frontal region consistent with underlying ICH. There is also moderate diffuse encephalopathy, nonspecific etiology. No seizures or epileptiform discharges were seen throughout the recording. Lora Havens   CT ANGIO HEAD W OR WO CONTRAST  Result Date: 04/27/2020 CLINICAL DATA:  Stroke/TIA, determine embolic source; Stroke/TIA, assess intracranial arteries EXAM: CT ANGIOGRAPHY HEAD AND NECK TECHNIQUE: Multidetector CT imaging of the head and neck was performed using the standard protocol during bolus administration of intravenous contrast. Multiplanar CT image reconstructions and MIPs were obtained to evaluate the vascular anatomy. Carotid stenosis measurements (when applicable) are obtained utilizing NASCET criteria, using the distal internal carotid diameter as the denominator. CONTRAST:  28m OMNIPAQUE IOHEXOL 350 MG/ML SOLN COMPARISON:  04/27/2020 head CT and prior. FINDINGS: CTA NECK FINDINGS Aortic arch: Minimal aortic arch atherosclerotic calcifications. Standard branching. Patent great vessel origins. Right carotid system: Patent. Minimal bifurcation atheromatous disease with 20% proximal ICA luminal narrowing. Left carotid system: Patent. Vertebral arteries: Mild narrowing of the right vertebral artery origin secondary to calcified atheromatous plaque. Tortuous left V1 segment. Patent. Skeleton: No acute or suspicious osseous abnormalities. Multilevel spondylosis. Other neck: No adenopathy.  No soft tissue  mass. Upper chest: Dependent atelectasis.  Trace left pleural effusion. Review of the MIP images confirms the above findings CTA HEAD FINDINGS Anterior circulation: Patent ICAs. Patent ACAs and MCAs. No significant stenosis, proximal occlusion, aneurysm, or vascular malformation. Posterior circulation: Patent PICA. Patent V4 segments and basilar artery. Fetal origin of the right PCA. Bilateral PCAs and proximal superior cerebellar arteries are patent. No significant stenosis, proximal occlusion, aneurysm, or vascular malformation. Venous sinuses: As permitted by contrast timing, patent. Anatomic variants: None. Left frontal intraparenchymal hemorrhage and minimal rightward midline shift, better evaluated on same day head CT. Review of the MIP images confirms the above findings IMPRESSION: No large vessel occlusion, high-grade narrowing, dissection or aneurysm. 20% proximal right ICA narrowing secondary to atheromatous disease. Mild right vertebral artery origin narrowing. Redemonstration of left frontal intraparenchymal hemorrhage, grossly unchanged. Electronically Signed   By: CPrimitivo GauzeM.D.   On: 04/27/2020 16:40   DG Chest 1 View  Result Date: 04/26/2020 CLINICAL DATA:  Altered mental status. EXAM: CHEST  1 VIEW COMPARISON:  03/20/2020. FINDINGS: The heart size and mediastinal contours are stable. Left subclavian approach cardiac rhythm maintenance device. Low lung volumes without focal consolidation. No visible pleural effusions or pneumothorax. No acute osseous abnormality. IMPRESSION: Low lung volumes without evidence of acute cardiopulmonary disease. Electronically Signed   By: FMargaretha SheffieldMD   On: 04/26/2020 08:32   CT Head Wo Contrast  Result Date: 05/14/2020 CLINICAL DATA:  Intracranial hemorrhage, follow-up EXAM: CT HEAD WITHOUT CONTRAST TECHNIQUE: Contiguous axial images were obtained from the base of the skull through  the vertex without intravenous contrast. COMPARISON:   05/08/2020 FINDINGS: Brain: Continued evolution of left frontal parenchymal hemorrhage with further decreased size and density. Small volume residual hemorrhage layering in the occipital horns. Stable findings of chronic microvascular ischemic changes and parenchymal volume loss. Chronic right parasagittal frontal infarct. No new hemorrhage or loss of gray-white differentiation. Vascular: There is atherosclerotic calcification at the skull base. Skull: Calvarium is unremarkable. Sinuses/Orbits: No acute finding. Other: None. IMPRESSION: Expected evolution of left frontal parenchymal hemorrhage. Small volume residual intraventricular hemorrhage. No acute findings. Electronically Signed   By: Macy Mis M.D.   On: 05/14/2020 20:46   CT HEAD WO CONTRAST  Result Date: 05/08/2020 CLINICAL DATA:  Mental status changes of unknown cause. Previous intracranial hemorrhage. EXAM: CT HEAD WITHOUT CONTRAST TECHNIQUE: Contiguous axial images were obtained from the base of the skull through the vertex without intravenous contrast. COMPARISON:  04/30/2020 FINDINGS: Brain: Background pattern of chronic small vessel ischemic changes throughout the pons, thalami, basal ganglia and cerebral hemispheric white matter. Subacute intraparenchymal hematoma in the left frontal lobe is becoming less dense and contracting. No sign of any new or worsening hemorrhage in that region. Blood layering dependently in the lateral ventricles is becoming less dense and resolving. No sign of developing hydrocephalus. No extra-axial collection. Vascular: There is atherosclerotic calcification of the major vessels at the base of the brain. Skull: Negative Sinuses/Orbits: Clear/normal Other: None IMPRESSION: 1. No new or worsening finding. Subacute intraparenchymal hematoma in the left frontal lobe is becoming less dense and contracting. No sign of any new or worsening hemorrhage in that region. Blood layering dependently in the lateral ventricles  is becoming less dense and resolving. No sign of developing hydrocephalus. 2. Background pattern of chronic small vessel ischemic changes throughout the brain as outlined above. Electronically Signed   By: Nelson Chimes M.D.   On: 05/08/2020 13:22   CT HEAD WO CONTRAST  Result Date: 04/30/2020 CLINICAL DATA:  Intracranial hemorrhage. Follow-up ventricular size. EXAM: CT HEAD WITHOUT CONTRAST TECHNIQUE: Contiguous axial images were obtained from the base of the skull through the vertex without intravenous contrast. COMPARISON:  CT head 04/27/2020 FINDINGS: Brain: Moderate to large hematoma left frontal lobe unchanged in size. There is mass-effect on the left frontal horn. There is intraventricular hemorrhage in the occipital pole bilaterally without progression. Mild improvement in ventricular blood on the left. Negative for hydrocephalus. 4 mm midline shift to the right unchanged. Extensive diffuse white matter hypodensity throughout both cerebral hemispheres most compatible with chronic microvascular ischemia. Vascular: Negative for hyperdense vessel Skull: Negative Sinuses/Orbits: Mild mucosal edema paranasal sinuses. NG tube in place. Negative orbit Other: None IMPRESSION: Moderate to large left frontal hematoma unchanged in size. Intraventricular hemorrhage slightly improved. No hydrocephalus. 4 mm midline shift to the right is unchanged. Electronically Signed   By: Franchot Gallo M.D.   On: 04/30/2020 13:21   CT HEAD WO CONTRAST  Result Date: 04/27/2020 CLINICAL DATA:  Parenchymal hemorrhage, follow-up left frontal ICH. EXAM: CT HEAD WITHOUT CONTRAST TECHNIQUE: Contiguous axial images were obtained from the base of the skull through the vertex without intravenous contrast. COMPARISON:  Head CT 04/26/2020. FINDINGS: Brain: Again demonstrated is multifocal intraparenchymal hemorrhage within the left frontal lobe. The hemorrhages appear more confluent and more numerous. In conglomerate, the left frontal  parenchymal hemorrhage now measure 6.6 x 2.9 x 4.8 cm (previously 6.6 x 2.9 x 4.7 cm, remeasured on prior). As before, there is partial effacement of the left lateral ventricle with 4  mm rightward midline shift. New from the prior examination, there is small volume intraventricular hemorrhage within the left greater than right occipital horns. No evidence of hydrocephalus. Unchanged scattered small volume subarachnoid hemorrhage, most notably along the anterior frontal lobes and right occipital lobe. Stable background generalized cerebral atrophy and chronic small vessel ischemic disease. No demarcated cortical infarct is identified. Vascular: No hyperdense vessel.  Atherosclerotic calcifications. Skull: Normal. Negative for fracture or focal lesion. Sinuses/Orbits: Visualized orbits show no acute finding. Mild ethmoid sinus mucosal thickening. No significant mastoid effusion. These results will be called to the ordering clinician or representative by the Radiologist Assistant, and communication documented in the PACS or [employer]. IMPRESSION: Left frontal lobe intraparenchymal hemorrhages appear more confluent and more numerous as compared to the exam of 04/26/2020. However, in conglomerate the hemorrhages have not significantly changed in size encompassing an area measuring 6.6 x 2.9 x 4.8 cm. Unchanged mass effect with partial effacement of the left lateral ventricle and 4 mm rightward midline shift. Small volume intraventricular hemorrhage within the left greater than right occipital horns, new from the prior examination. No evidence of hydrocephalus. Unchanged scattered small volume subarachnoid hemorrhage along the bilateral cerebral hemispheres, most notably along the anterior frontal lobes and right occipital lobe. Electronically Signed   By: Kellie Simmering DO   On: 04/27/2020 07:49   CT HEAD WO CONTRAST  Result Date: 04/26/2020 CLINICAL DATA:  Altered mental status EXAM: CT HEAD WITHOUT CONTRAST  TECHNIQUE: Contiguous axial images were obtained from the base of the skull through the vertex without intravenous contrast. COMPARISON:  05/29/2015 head CT and prior. FINDINGS: Brain: New focus of left frontal intraparenchymal hemorrhage measuring 5.8 x 2.9 cm (3:19) with peripheral edema. There is tracking of blood products along the anterior falx and bifrontal sulci. Rightward midline shift of 4 mm. Multiple discrete hyperdensities are seen within the left frontal lobe the largest of which measures 2.5 cm and demonstrates an internal fluid fluid level (3:20). Small volume subarachnoid hemorrhage also overlies the occipital convexities. No intraventricular extension of hemorrhage. No ventriculomegaly. Mild cerebral atrophy with ex vacuo dilatation. Chronic microvascular ischemic changes. Vascular: No hyperdense vessel. Bilateral carotid siphon atherosclerotic calcifications. Skull: No acute fracture or focal lesion. Sinuses/Orbits: Normal orbits. Clear paranasal sinuses and mastoid air cells. Other: None. IMPRESSION: 5.8 cm left frontal intraparenchymal hemorrhage with 4 mm rightward midline shift. Multifocal left frontal hyperdensities some of which demonstrate internal fluid-fluid levels are suspicious for septic emboli. Consider MRI head with and without contrast for further evaluation. Small volume subdural and subarachnoid hemorrhage. These results were called by telephone at the time of interpretation on 04/26/2020 at 9:27 am to provider [facility] , who verbally acknowledged these results. Electronically Signed   By: Primitivo Gauze M.D.   On: 04/26/2020 09:39   CT ANGIO NECK W OR WO CONTRAST  Result Date: 04/27/2020 CLINICAL DATA:  Stroke/TIA, determine embolic source; Stroke/TIA, assess intracranial arteries EXAM: CT ANGIOGRAPHY HEAD AND NECK TECHNIQUE: Multidetector CT imaging of the head and neck was performed using the standard protocol during bolus administration of intravenous contrast.  Multiplanar CT image reconstructions and MIPs were obtained to evaluate the vascular anatomy. Carotid stenosis measurements (when applicable) are obtained utilizing NASCET criteria, using the distal internal carotid diameter as the denominator. CONTRAST:  96m OMNIPAQUE IOHEXOL 350 MG/ML SOLN COMPARISON:  04/27/2020 head CT and prior. FINDINGS: CTA NECK FINDINGS Aortic arch: Minimal aortic arch atherosclerotic calcifications. Standard branching. Patent great vessel origins. Right carotid system: Patent. Minimal  bifurcation atheromatous disease with 20% proximal ICA luminal narrowing. Left carotid system: Patent. Vertebral arteries: Mild narrowing of the right vertebral artery origin secondary to calcified atheromatous plaque. Tortuous left V1 segment. Patent. Skeleton: No acute or suspicious osseous abnormalities. Multilevel spondylosis. Other neck: No adenopathy.  No soft tissue mass. Upper chest: Dependent atelectasis.  Trace left pleural effusion. Review of the MIP images confirms the above findings CTA HEAD FINDINGS Anterior circulation: Patent ICAs. Patent ACAs and MCAs. No significant stenosis, proximal occlusion, aneurysm, or vascular malformation. Posterior circulation: Patent PICA. Patent V4 segments and basilar artery. Fetal origin of the right PCA. Bilateral PCAs and proximal superior cerebellar arteries are patent. No significant stenosis, proximal occlusion, aneurysm, or vascular malformation. Venous sinuses: As permitted by contrast timing, patent. Anatomic variants: None. Left frontal intraparenchymal hemorrhage and minimal rightward midline shift, better evaluated on same day head CT. Review of the MIP images confirms the above findings IMPRESSION: No large vessel occlusion, high-grade narrowing, dissection or aneurysm. 20% proximal right ICA narrowing secondary to atheromatous disease. Mild right vertebral artery origin narrowing. Redemonstration of left frontal intraparenchymal hemorrhage, grossly  unchanged. Electronically Signed   By: Primitivo Gauze M.D.   On: 04/27/2020 16:40   DG Chest Port 1 View  Result Date: 05/14/2020 CLINICAL DATA:  Change in mental status EXAM: PORTABLE CHEST 1 VIEW COMPARISON:  April 26, 2020 FINDINGS: The heart size and mediastinal contours are within normal limits. There is prominence of the central pulmonary vasculature. No large airspace consolidation or pleural effusion. A left-sided pacemaker seen with the lead tips in the right atrium and right ventricle. The visualized skeletal structures are unremarkable. IMPRESSION: Mild pulmonary vascular congestion. Electronically Signed   By: Prudencio Pair M.D.   On: 05/14/2020 18:11   DG Swallowing Func-Speech Pathology  Result Date: 05/10/2020 Objective Swallowing Evaluation: Type of Study: MBS-Modified Barium Swallow Study  Patient Details Name: JERMINE BIBBEE MRN: [id number] Date of Birth: 06-04-1941 Today's Date: 05/10/2020 Time: SLP Start Time (ACUTE ONLY): 1155 -SLP Stop Time (ACUTE ONLY): 1210 SLP Time Calculation (min) (ACUTE ONLY): 15 min Past Medical History: Past Medical History: Diagnosis Date . Altered mental status 05/29/2015 . DVT (deep vein thrombosis) in pregnancy  . Hypertension  . Pulmonary embolism ([place])  . Sinus node dysfunction ([facility])   With pacemaker Past Surgical History: Past Surgical History: Procedure Laterality Date . INSERT / REPLACE / REMOVE PACEMAKER  ~ 2009  [employer] Versa VEDro1 . 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 Pt had recent E. Coli UTI and hospitalization  9/27- 10/1 for gout and cellulitis. Since, he has had a progressive decline, per H&P, pt had not been eating well and stopped speaking on date of admission.  He was transferred to [facility] for further  evaluation. EEG showed no electrographic seizures. CT head 11/4: Left frontal lobe intraparenchymal hemorrhages appear more confluent and more numerous as compared to the exam of 04/26/2020  Subjective: alert but not following commands Assessment / Plan / Recommendation CHL IP CLINICAL IMPRESSIONS 05/10/2020 Clinical Impression Pt was seen for MBS which revealed a mild oropharyngeal dysphagia characterized by delayed oral transit, premature spillage, and pharyngeal residue. Pt was observed to intermitently demonstrate delayed oral transit, primarily with thin and nectar thick liquids. He also demonstrated premature spillage  of liquids that fell to the level of the pyriforms before the swallow was initiated. No instances of penetration or aspiration were observed across POs. Mild pharyngeal residue of puree was observed in the valleculae after the swallow. Recommend pt start dys 1 diet with thin liquids. Crush meds. Pt will require assist with feeding. SLP will f/u acutely for diet toleration.  SLP Visit Diagnosis Dysphagia, oropharyngeal phase (R13.12) Attention and concentration deficit following -- Frontal lobe and executive function deficit following -- Impact on safety and function Mild aspiration risk   CHL IP TREATMENT RECOMMENDATION 05/10/2020 Treatment Recommendations Therapy as outlined in treatment plan below   Prognosis 05/10/2020 Prognosis for Safe Diet Advancement Fair Barriers to Reach Goals Cognitive deficits;Severity of deficits Barriers/Prognosis Comment -- CHL IP DIET RECOMMENDATION 05/10/2020 SLP Diet Recommendations Dysphagia 1 (Puree) solids;Thin liquid Liquid Administration via Straw;Cup Medication Administration Crushed with puree Compensations -- Postural Changes Seated upright at 90 degrees   CHL IP OTHER RECOMMENDATIONS 05/10/2020 Recommended Consults -- Oral Care Recommendations Oral care BID Other Recommendations --   CHL IP FOLLOW UP RECOMMENDATIONS 05/10/2020 Follow up Recommendations  Inpatient Rehab   CHL IP FREQUENCY AND DURATION 05/10/2020 Speech Therapy Frequency (ACUTE ONLY) min 2x/week Treatment Duration 2 weeks      CHL IP ORAL PHASE 05/10/2020 Oral Phase Impaired Oral - Pudding Teaspoon -- Oral - Pudding Cup -- Oral - Honey Teaspoon -- Oral - Honey Cup -- Oral - Nectar Teaspoon -- Oral - Nectar Cup -- Oral - Nectar Straw Delayed oral transit;Premature spillage Oral - Thin Teaspoon -- Oral - Thin Cup -- Oral - Thin Straw Delayed oral transit;Premature spillage Oral - Puree Delayed oral transit Oral - Mech Soft -- Oral - Regular -- Oral - Multi-Consistency -- Oral - Pill -- Oral Phase - Comment --  CHL IP PHARYNGEAL PHASE 05/10/2020 Pharyngeal Phase Impaired Pharyngeal- Pudding Teaspoon -- Pharyngeal -- Pharyngeal- Pudding Cup -- Pharyngeal -- Pharyngeal- Honey Teaspoon -- Pharyngeal -- Pharyngeal- Honey Cup -- Pharyngeal -- Pharyngeal- Nectar Teaspoon -- Pharyngeal -- Pharyngeal- Nectar Cup -- Pharyngeal -- Pharyngeal- Nectar Straw Pharyngeal residue - valleculae Pharyngeal -- Pharyngeal- Thin Teaspoon -- Pharyngeal -- Pharyngeal- Thin Cup -- Pharyngeal -- Pharyngeal- Thin Straw Pharyngeal residue - valleculae Pharyngeal -- Pharyngeal- Puree Pharyngeal residue - valleculae Pharyngeal -- Pharyngeal- Mechanical Soft -- Pharyngeal -- Pharyngeal- Regular -- Pharyngeal -- Pharyngeal- Multi-consistency -- Pharyngeal -- Pharyngeal- Pill -- Pharyngeal -- Pharyngeal Comment --  No flowsheet data found. Herbie [place], MA CCC-SLP Acute Rehabilitation Services Pager [phone] Office [phone] Lynann Beaver 05/10/2020, 1:54 PM              ECHOCARDIOGRAM COMPLETE BUBBLE STUDY  Result Date: 04/26/2020    ECHOCARDIOGRAM REPORT   Patient Name:   TRYSTIAN CRISANTO Date of Exam: 04/26/2020 Medical Rec #:  [id number]        Height:       66.0 in Accession #:    [id number]       Weight:       175.0 lb Date of Birth:  26-Aug-1940       BSA:          1.890 m Patient Age:    23 years          BP:           129/96 mmHg Patient Gender: M                HR:  109 bpm. Exam Location:  Inpatient Procedure: 2D Echo, Intracardiac Opacification Agent and Saline Contrast Bubble            Study Indications:    Stroke 434.91 / I63.9  History:        Patient has prior history of Echocardiogram examinations, most                 recent 03/07/2020. Pacemaker, Arrythmias:Atrial Fibrillation and                 Sinus dysfunction; Risk Factors:Hypertension. History of DVT and                 PE.  Sonographer:    Darlina Sicilian RDCS Referring Phys: [place]  1. Left ventricular ejection fraction, by estimation, is 55%. The left ventricle has low normal function. The left ventricle has no regional wall motion abnormalities. There is mild asymmetric left ventricular hypertrophy of the septal segment. Left ventricular diastolic parameters are indeterminate.  2. Right ventricular systolic function is mildly reduced. The right ventricular size is mildly enlarged. There is moderately elevated pulmonary artery systolic pressure. The estimated right ventricular systolic pressure is 24.8 mmHg.  3. Left atrial size was mildly dilated.  4. The mitral valve is normal in structure. Trivial mitral valve regurgitation. No evidence of mitral stenosis.  5. Tricuspid valve regurgitation is mild to moderate.  6. The aortic valve is grossly normal. There is mild calcification of the aortic valve. Aortic valve regurgitation is mild. No aortic stenosis is present.  7. Aortic dilatation noted. There is mild dilatation of the ascending aorta, measuring 42 mm.  8. The inferior vena cava is dilated in size with <50% respiratory variability, suggesting right atrial pressure of 15 mmHg. Conclusion(s)/Recommendation(s): No intracardiac source of embolism detected on this transthoracic study. A transesophageal echocardiogram is recommended to exclude cardiac source of embolism if clinically indicated. FINDINGS  Left  Ventricle: Left ventricular ejection fraction, by estimation, is 55%. The left ventricle has low normal function. The left ventricle has no regional wall motion abnormalities. Definity contrast agent was given IV to delineate the left ventricular endocardial borders. The left ventricular internal cavity size was normal in size. There is mild asymmetric left ventricular hypertrophy of the septal segment. Left ventricular diastolic parameters are indeterminate. Right Ventricle: The right ventricular size is mildly enlarged. No increase in right ventricular wall thickness. Right ventricular systolic function is mildly reduced. There is moderately elevated pulmonary artery systolic pressure. The tricuspid regurgitant velocity is 2.84 m/s, and with an assumed right atrial pressure of 15 mmHg, the estimated right ventricular systolic pressure is 25.0 mmHg. Left Atrium: Left atrial size was mildly dilated. Right Atrium: Right atrial size was normal in size. Pericardium: There is no evidence of pericardial effusion. Mitral Valve: The mitral valve is normal in structure. Trivial mitral valve regurgitation. No evidence of mitral valve stenosis. Tricuspid Valve: The tricuspid valve is normal in structure. Tricuspid valve regurgitation is mild to moderate. No evidence of tricuspid stenosis. Aortic Valve: The aortic valve is grossly normal. There is mild calcification of the aortic valve. Aortic valve regurgitation is mild. Aortic regurgitation PHT measures 386 msec. No aortic stenosis is present. Pulmonic Valve: The pulmonic valve was not well visualized. Pulmonic valve regurgitation is mild. No evidence of pulmonic stenosis. Aorta: Aortic dilatation noted. There is mild dilatation of the ascending aorta, measuring 42 mm. Venous: The inferior vena cava is dilated in size with less than 50%  respiratory variability, suggesting right atrial pressure of 15 mmHg. IAS/Shunts: No atrial level shunt detected by color flow Doppler.  Agitated saline contrast was given intravenously to evaluate for intracardiac shunting. EKG: Rhythm strip during this exam demostrated atrial fibrillation. Additional Comments: A pacer wire is visualized in the right atrium and right ventricle.  LEFT VENTRICLE PLAX 2D LVIDd:         4.26 cm      Diastology LVIDs:         3.76 cm      LV e' medial:    7.51 cm/s LV PW:         0.67 cm      LV E/e' medial:  12.9 LV IVS:        1.07 cm      LV e' lateral:   12.53 cm/s LVOT diam:     2.30 cm      LV E/e' lateral: 7.8 LV SV:         50 LV SV Index:   26 LVOT Area:     4.15 cm  LV Volumes (MOD) LV vol d, MOD A2C: 112.0 ml LV vol d, MOD A4C: 109.0 ml LV vol s, MOD A2C: 53.4 ml LV vol s, MOD A4C: 64.0 ml LV SV MOD A2C:     58.6 ml LV SV MOD A4C:     109.0 ml LV SV MOD BP:      51.8 ml RIGHT VENTRICLE RV S prime:     8.81 cm/s TAPSE (M-mode): 1.6 cm LEFT ATRIUM             Index       RIGHT ATRIUM           Index LA diam:        3.00 cm 1.59 cm/m  RA Area:     14.60 cm LA Vol (A2C):   68.4 ml 36.20 ml/m RA Volume:   34.60 ml  18.31 ml/m LA Vol (A4C):   64.6 ml 34.19 ml/m LA Biplane Vol: 67.1 ml 35.51 ml/m  AORTIC VALVE LVOT Vmax:   69.85 cm/s LVOT Vmean:  49.000 cm/s LVOT VTI:    0.120 m AI PHT:      386 msec  AORTA Ao Root diam: 3.70 cm Ao Asc diam:  3.70 cm MITRAL VALVE               TRICUSPID VALVE MV Area (PHT): 4.65 cm    TR Peak grad:   32.3 mmHg MV Decel Time: 163 msec    TR Vmax:        284.00 cm/s MV E velocity: 97.13 cm/s                            SHUNTS                            Systemic VTI:  0.12 m                            Systemic Diam: 2.30 cm Cherlynn Kaiser MD Electronically signed by Cherlynn Kaiser MD Signature Date/Time: 04/26/2020/11:08:03 PM    Final    VAS [place] LOWER EXTREMITY VENOUS (DVT)  Result Date: 04/26/2020  Lower Venous DVT Study Other Indications: Follow up dvt. Comparison Study: 03/07/20 previous Performing Technologist: Abram Sander RVS  Examination Guidelines: A complete  evaluation includes B-mode imaging, spectral Doppler, color Doppler, and power Doppler as needed of all accessible portions of each vessel. Bilateral testing is considered an integral part of a complete examination. Limited examinations for reoccurring indications may be performed as noted. The reflux portion of the exam is performed with the patient in reverse Trendelenburg.  +---------+---------------+---------+-----------+----------+--------------+ RIGHT    CompressibilityPhasicitySpontaneityPropertiesThrombus Aging +---------+---------------+---------+-----------+----------+--------------+ CFV      Full           Yes      Yes                                 +---------+---------------+---------+-----------+----------+--------------+ SFJ      Full                                                        +---------+---------------+---------+-----------+----------+--------------+ FV Prox  Full                                                        +---------+---------------+---------+-----------+----------+--------------+ FV Mid   Full                                                        +---------+---------------+---------+-----------+----------+--------------+ FV DistalFull                                                        +---------+---------------+---------+-----------+----------+--------------+ PFV      Full                                                        +---------+---------------+---------+-----------+----------+--------------+ POP      Full           Yes      Yes                                 +---------+---------------+---------+-----------+----------+--------------+ PTV      Full                                                        +---------+---------------+---------+-----------+----------+--------------+ PERO     Full                                                         +---------+---------------+---------+-----------+----------+--------------+   +---------+---------------+---------+-----------+----------+--------------+  LEFT     CompressibilityPhasicitySpontaneityPropertiesThrombus Aging +---------+---------------+---------+-----------+----------+--------------+ CFV      Full           Yes      Yes                                 +---------+---------------+---------+-----------+----------+--------------+ SFJ      Full                                                        +---------+---------------+---------+-----------+----------+--------------+ FV Prox  Full                                                        +---------+---------------+---------+-----------+----------+--------------+ FV Mid   Full                                                        +---------+---------------+---------+-----------+----------+--------------+ FV DistalFull                                                        +---------+---------------+---------+-----------+----------+--------------+ PFV      Full                                                        +---------+---------------+---------+-----------+----------+--------------+ POP      Full           Yes      Yes                                 +---------+---------------+---------+-----------+----------+--------------+ PTV      Full                                                        +---------+---------------+---------+-----------+----------+--------------+ PERO     Full                                                        +---------+---------------+---------+-----------+----------+--------------+     Summary: BILATERAL: - No evidence of deep vein thrombosis seen in the lower extremities, bilaterally. - No evidence of superficial venous thrombosis in the lower extremities, bilaterally. -No evidence of popliteal cyst, bilaterally.   *See table(s) above for measurements  and observations. Electronically  signed by Curt Jews MD on 04/26/2020 at 3:04:19 PM.    Final      CBC Recent Labs  Lab 05/11/20 0053 05/14/20 1733 05/15/20 0420  WBC 10.6* 13.6* 13.7*  HGB 14.1 17.5* 13.8  HCT 43.7 59.2* 48.8  PLT 281 269 157  MCV 91.6 102.2* 104.9*  MCH 29.6 30.2 29.7  MCHC 32.3 29.6* 28.3*  RDW 15.9* 17.9* 16.6*  LYMPHSABS  --  0.8  --   MONOABS  --  1.2*  --   EOSABS  --  0.0  --   BASOSABS  --  0.1  --     Chemistries  Recent Labs  Lab 05/14/20 1733 05/14/20 2101 05/15/20 0420 05/15/20 1106 05/15/20 1705 05/15/20 2327 05/16/20 0558  NA 171*   < > 169* 164* 164* 163* 165*  K 4.5   < > 3.8 3.5 3.6 3.4* 3.3*  CL >130*   < > >130* 130* >130* >130* >130*  CO2 22   < > _0 20* 24  GLUCOSE 135*   < > 119* 157* 126* 128* 121*  BUN 132*   < > 123* 120* 106* 101* 92*  CREATININE 4.09*   < > 3.15* 2.79* 2.47* 2.31* 2.13*  CALCIUM 10.9*   < > 9.8 9.4 9.5 9.2 9.4  AST 64*  --  44*  --   --   --   --   ALT 116*  --  81*  --   --   --   --   ALKPHOS 112  --  78  --   --   --   --   BILITOT 1.8*  --  1.3*  --   --   --   --    < > = values in this interval not displayed.   ------------------------------------------------------------------------------------------------------------------ No results for input(s): CHOL, HDL, LDLCALC, TRIG, CHOLHDL, LDLDIRECT in the last 72 hours.  Lab Results  Component Value Date   HGBA1C 5.8 (H) 04/26/2020   ------------------------------------------------------------------------------------------------------------------ No results for input(s): TSH, T4TOTAL, T3FREE, THYROIDAB in the last 72 hours.  Invalid input(s): FREET3 ------------------------------------------------------------------------------------------------------------------ No results for input(s): VITAMINB12, FOLATE, FERRITIN, TIBC, IRON, RETICCTPCT in the last 72 hours.  Coagulation profile Recent Labs  Lab 05/14/20 1733  INR 1.5*    No  results for input(s): DDIMER in the last 72 hours.  Cardiac Enzymes No results for input(s): CKMB, TROPONINI, MYOGLOBIN in the last 168 hours.  Invalid input(s): CK ------------------------------------------------------------------------------------------------------------------    Component Value Date/Time   BNP 76.0 05/14/2020 1749     Charmaine Placido M.D on 05/16/2020 at 9:39 AM  Go to [email or web address] - for contact info  [facility] - Office  [phone]

## 2020-05-17 DIAGNOSIS — I480 Paroxysmal atrial fibrillation: Secondary | ICD-10-CM

## 2020-05-17 DIAGNOSIS — N179 Acute kidney failure, unspecified: Secondary | ICD-10-CM

## 2020-05-17 DIAGNOSIS — Z515 Encounter for palliative care: Secondary | ICD-10-CM | POA: Diagnosis not present

## 2020-05-17 DIAGNOSIS — Z7189 Other specified counseling: Secondary | ICD-10-CM | POA: Diagnosis not present

## 2020-05-17 DIAGNOSIS — E870 Hyperosmolality and hypernatremia: Secondary | ICD-10-CM | POA: Diagnosis not present

## 2020-05-17 DIAGNOSIS — I611 Nontraumatic intracerebral hemorrhage in hemisphere, cortical: Secondary | ICD-10-CM

## 2020-05-17 DIAGNOSIS — N390 Urinary tract infection, site not specified: Secondary | ICD-10-CM

## 2020-05-17 DIAGNOSIS — A419 Sepsis, unspecified organism: Secondary | ICD-10-CM | POA: Diagnosis not present

## 2020-05-17 LAB — BASIC METABOLIC PANEL
Anion gap: 8 (ref 5–15)
Anion gap: 8 (ref 5–15)
Anion gap: 8 (ref 5–15)
Anion gap: 7 (ref 5–15)
BUN: 64 mg/dL — ABNORMAL HIGH (ref 8–23)
BUN: 57 mg/dL — ABNORMAL HIGH (ref 8–23)
BUN: 54 mg/dL — ABNORMAL HIGH (ref 8–23)
BUN: 49 mg/dL — ABNORMAL HIGH (ref 8–23)
CO2: 25 mmol/L (ref 22–32)
CO2: 23 mmol/L (ref 22–32)
CO2: 25 mmol/L (ref 22–32)
CO2: 25 mmol/L (ref 22–32)
Calcium: 9.1 mg/dL (ref 8.9–10.3)
Calcium: 9.2 mg/dL (ref 8.9–10.3)
Calcium: 9.1 mg/dL (ref 8.9–10.3)
Calcium: 8.9 mg/dL (ref 8.9–10.3)
Chloride: 126 mmol/L — ABNORMAL HIGH (ref 98–111)
Chloride: 124 mmol/L — ABNORMAL HIGH (ref 98–111)
Chloride: 123 mmol/L — ABNORMAL HIGH (ref 98–111)
Chloride: 123 mmol/L — ABNORMAL HIGH (ref 98–111)
Creatinine, Ser: 1.5 mg/dL — ABNORMAL HIGH (ref 0.61–1.24)
Creatinine, Ser: 1.41 mg/dL — ABNORMAL HIGH (ref 0.61–1.24)
Creatinine, Ser: 1.34 mg/dL — ABNORMAL HIGH (ref 0.61–1.24)
Creatinine, Ser: 1.22 mg/dL (ref 0.61–1.24)
GFR, Estimated: 47 mL/min — ABNORMAL LOW (ref >60)
GFR, Estimated: 51 mL/min — ABNORMAL LOW (ref >60)
GFR, Estimated: 54 mL/min — ABNORMAL LOW (ref >60)
GFR, Estimated: >60 mL/min (ref >60)
Glucose, Bld: 163 mg/dL — ABNORMAL HIGH (ref 70–99)
Glucose, Bld: 162 mg/dL — ABNORMAL HIGH (ref 70–99)
Glucose, Bld: 157 mg/dL — ABNORMAL HIGH (ref 70–99)
Glucose, Bld: 142 mg/dL — ABNORMAL HIGH (ref 70–99)
Potassium: 3.6 mmol/L (ref 3.5–5.1)
Potassium: 3.7 mmol/L (ref 3.5–5.1)
Potassium: 3.6 mmol/L (ref 3.5–5.1)
Potassium: 3.6 mmol/L (ref 3.5–5.1)
Sodium: 159 mmol/L — ABNORMAL HIGH (ref 135–145)
Sodium: 155 mmol/L — ABNORMAL HIGH (ref 135–145)
Sodium: 156 mmol/L — ABNORMAL HIGH (ref 135–145)
Sodium: 155 mmol/L — ABNORMAL HIGH (ref 135–145)

## 2020-05-17 LAB — GLUCOSE, CAPILLARY
Glucose-Capillary: 125 mg/dL — ABNORMAL HIGH (ref 70–99)
Glucose-Capillary: 132 mg/dL — ABNORMAL HIGH (ref 70–99)
Glucose-Capillary: 139 mg/dL — ABNORMAL HIGH (ref 70–99)
Glucose-Capillary: 140 mg/dL — ABNORMAL HIGH (ref 70–99)
Glucose-Capillary: 163 mg/dL — ABNORMAL HIGH (ref 70–99)

## 2020-05-17 LAB — URINE CULTURE: Culture: >=100000 — AB

## 2020-05-17 LAB — CBC
HCT: 43.4 % (ref 39.0–52.0)
Hemoglobin: 12.6 g/dL — ABNORMAL LOW (ref 13.0–17.0)
MCH: 29.8 pg (ref 26.0–34.0)
MCHC: 29 g/dL — ABNORMAL LOW (ref 30.0–36.0)
MCV: 102.6 fL — ABNORMAL HIGH (ref 80.0–100.0)
Platelets: 81 10*3/uL — ABNORMAL LOW (ref 150–400)
RBC: 4.23 MIL/uL (ref 4.22–5.81)
RDW: 16.1 % — ABNORMAL HIGH (ref 11.5–15.5)
WBC: 11.6 10*3/uL — ABNORMAL HIGH (ref 4.0–10.5)
nRBC: 0.2 % (ref 0.0–0.2)

## 2020-05-17 MED ORDER — LINEZOLID 600 MG/300ML IV SOLN
600.0000 mg | Freq: Two times a day (BID) | INTRAVENOUS | Status: DC
  Administered 2020-05-17 – 2020-05-19 (×4): 600 mg via INTRAVENOUS
  Filled 2020-05-17 (×10): qty 300

## 2020-05-17 NOTE — Progress Notes (Signed)
Palliative: [name] is lying quietly in bed.  He is more alert today, but does not respond verbally to my questions.  His eyes are open, but he will not not make eye contact.  I do not believe he is able to make his basic needs known.  He continues to resist care at times.  There is no family at bedside at this time.  I return later in the day, but family is not at bedside.  Nursing staff shares that son Luke Crosby came and left quickly.   Call to son Luke Crosby.  No answer, unable to leave VM.    Conference with attending, bedside nursing staff related to patient condition, needs, goals of care. PMT to continue to follow.    Plan: At this point continue to treat the treatable but no CPR or intubation.  Time for outcomes.  25 minutes Luke Carmel, NP Palliative Medicine Team Team Phone # [phone] Greater than 50% of this time was spent counseling and coordinating care related to the above assessment and plan.

## 2020-05-17 NOTE — Progress Notes (Signed)
[facility]  PROGRESS NOTE  Luke Crosby JOI:[id number] DOB: 1941/02/21 DOA: 05/14/2020 PCP: Gareth Morgan, MD   Brief HPI:   79 year old male with history of bilateral pulmonary embolism, right lower extremity DVT in September 2021, atrial fibrillation with sinus node dysfunction, history of pacemaker placement, hypertension, gout who was admitted to [facility] from 04/26/2020 until 05/13/2020 due to intracranial hemorrhage after he had mechanical fall with head injury while anticoagulated on Eliquis for A. fib and DVT/PE.  Anticoagulation was stopped and patient was discharged home with home health services after insurance refused to pay for inpatient rehab and son was not happy going to skilled nursing facility rehab. Patient return to [facility] on 05/14/2020 with worsening mental status and found to be severely dehydrated with sodium of 171 and chloride 130.  He had elevated creatinine of 4.0.   Subjective   Patient seen and examined, continues to be lethargic.  Sodium has improved to 156 this morning.   Assessment/Plan:     1. Severe sepsis-patient presented with altered mental status, hypernatremia, abnormal UA.  Urine culture growing greater than 100,000 colonies per mL Enterococcus faecium, VRE.  Will discontinue vancomycin and start Zyvox.  We will also discontinue cefepime.  Patient platelets as low, 81,000.  Will closely monitor patient's  platelets in the hospital. 2. Acute kidney injury-likely from dehydration, creatinine admission was 4.0, he has baseline creatinine 0.9 as of 05/03/2020.  Creatinine has improved to 1.34.  Continue D5W at 75 mill per hour.  Follow BMP in am. 3. Hypernatremia-patient presents with sodium 171, started on D5W.  Sodium is now down to 156.  Will cut down the rate of D5W to 75 mill per hour.  Follow BMP in am. 4. Traumatic left frontal intraparenchymal hemorrhage-small volume SDH, date of injury 04/26/2020.  S/p reversal with Kcentra and  vitamin K.  Seen by neurosurgery, no intervention advised.  Repeat CT head on 05/14/2020 shows residual intracranial hemorrhage, no acute findings. 5. Chronic atrial fibrillation with RVR- CHA2DS2VASc score is 3, HAS-BLED score 3, no further anticoagulation due to traumatic ICH.  Continue IV metoprolol 5 mg every 8 hours. 6. Pulmonary embolism with extensive lower extremity DVT-diagnosed on 03/06/2020, Eliquis was discontinued traumatic ICH. 7. Transaminitis-in the setting of hypotension and possible shock liver.  Significantly improved, follow LFTs in a.m. 8. Metabolic encephalopathy-multifactorial due to sepsis and hypernatremia.     COVID-19 Labs  No results for input(s): DDIMER, FERRITIN, LDH, CRP in the last 72 hours.  Lab Results  Component Value Date   SARSCOV2NAA NEGATIVE 05/14/2020   SARSCOV2NAA NEGATIVE 04/26/2020   SARSCOV2NAA NEGATIVE 03/21/2020   SARSCOV2NAA NEGATIVE 03/06/2020     Scheduled medications:   . Chlorhexidine Gluconate Cloth  6 each Topical Daily  . mouth rinse  15 mL Mouth Rinse BID  . metoprolol tartrate  5 mg Intravenous Q8H         CBG: Recent Labs  Lab 05/16/20 1619 05/16/20 2025 05/17/20 0008 05/17/20 0813 05/17/20 1231  GLUCAP 150* 139* 125* 140* 139*    SpO2: 99 % O2 Flow Rate (L/min): 2 L/min FiO2 (%): (!) 0 %    CBC: Recent Labs  Lab 05/11/20 0053 05/14/20 1733 05/15/20 0420 05/17/20 0355  WBC 10.6* 13.6* 13.7* 11.6*  NEUTROABS  --  11.3*  --   --   HGB 14.1 17.5* 13.8 12.6*  HCT 43.7 59.2* 48.8 43.4  MCV 91.6 102.2* 104.9* 102.6*  PLT 281 269 157 81*    Basic Metabolic  Panel: Recent Labs  Lab 05/16/20 1850 05/16/20 2123 05/17/20 0355 05/17/20 0936 05/17/20 1158  NA 144 160* 159* 155* 156*  K 3.4* 4.0 3.6 3.7 3.6  CL 115* 129* 126* 124* 123*  CO2 23 22 25 23 25   GLUCOSE 554* 143* 163* 162* 157*  BUN 68* 72* 64* 57* 54*  CREATININE 1.75* 1.68* 1.50* 1.41* 1.34*  CALCIUM 8.3* 9.0 9.1 9.2 9.1     Liver  Function Tests: Recent Labs  Lab 05/14/20 1733 05/15/20 0420  AST 64* 44*  ALT 116* 81*  ALKPHOS 112 78  BILITOT 1.8* 1.3*  PROT 8.0 5.8*  ALBUMIN 3.6 2.7*     Antibiotics: Anti-infectives (From admission, onward)   Start     Dose/Rate Route Frequency Ordered Stop   05/16/20 1800  vancomycin (VANCOREADY) IVPB 750 mg/150 mL  Status:  Discontinued        750 mg 150 mL/hr over 60 Minutes Intravenous Every 48 hours 05/14/20 1842 05/16/20 1009   05/15/20 1800  ceFEPIme (MAXIPIME) 2 g in sodium chloride 0.9 % 100 mL IVPB       Note to Pharmacy: Adjust dosage as needed please. TY.   2 g 200 mL/hr over 30 Minutes Intravenous Every 24 hours 05/14/20 2325     05/15/20 1200  Ampicillin-Sulbactam (UNASYN) 3 g in sodium chloride 0.9 % 100 mL IVPB  Status:  Discontinued        3 g 200 mL/hr over 30 Minutes Intravenous Every 24 hours 05/14/20 2055 05/15/20 1408   05/14/20 1800  ceFEPIme (MAXIPIME) 2 g in sodium chloride 0.9 % 100 mL IVPB        2 g 200 mL/hr over 30 Minutes Intravenous  Once 05/14/20 1752 05/14/20 1854   05/14/20 1800  Ampicillin-Sulbactam (UNASYN) 3 g in sodium chloride 0.9 % 100 mL IVPB  Status:  Discontinued        3 g 200 mL/hr over 30 Minutes Intravenous Every 6 hours 05/14/20 1752 05/14/20 2055   05/14/20 1800  vancomycin (VANCOCIN) IVPB 1000 mg/200 mL premix  Status:  Discontinued        1,000 mg 200 mL/hr over 60 Minutes Intravenous  Once 05/14/20 1752 05/14/20 1755   05/14/20 1800  vancomycin (VANCOREADY) IVPB 1500 mg/300 mL        1,500 mg 150 mL/hr over 120 Minutes Intravenous  Once 05/14/20 1755 05/14/20 2031       DVT prophylaxis: SCDs  Code Status: DNR  Family Communication: Discussed with patient's son at bedside   Consultants:    Procedures:      Objective   Vitals:   05/17/20 0829 05/17/20 1232 05/17/20 1300 05/17/20 1400  BP:   121/78 111/74  Pulse:  (!) 118 63 (!) 40  Resp: 18 17 12 14   Temp: 98.8 F (37.1 C) 98.8 F (37.1 C)  98.8 F (37.1 C) 98.8 F (37.1 C)  TempSrc: Bladder Bladder    SpO2:  100% 100% 99%  Weight:      Height:        Intake/Output Summary (Last 24 hours) at 05/17/2020 1448 Last data filed at 05/17/2020 0523 Gross per 24 hour  Intake 3387.91 ml  Output 1150 ml  Net 2237.91 ml    11/22 1901 - 11/24 0700 In: 4382.8 [I.V.:3811.7] Out: 1875 [Urine:1875]  Filed Weights   05/14/20 1730 05/15/20 0500  Weight: 68 kg 65.9 kg    Physical Examination:    General: Appears lethargic  Cardiovascular: S1-S2, regular  Respiratory: Clear to auscultation bilaterally  Abdomen: Soft, nontender, no organomegaly  Extremities: No edema in the lower extremities  Neurologic: Lethargic   Status is: Inpatient  Dispo: The patient is from: Home              Anticipated d/c is to: Skilled nursing facility              Anticipated d/c date is: 05/22/2020              Patient currently not medically stable for discharge  Barrier to discharge-ongoing treatment for hypernatremia, UTI     Data Reviewed:   Recent Results (from the past 240 hour(s))  Resp Panel by RT-PCR (Flu A&B, Covid) Nasopharyngeal Swab     Status: None   Collection Time: 05/14/20  5:37 PM   Specimen: Nasopharyngeal Swab; Nasopharyngeal(NP) swabs in vial transport medium  Result Value Ref Range Status   SARS Coronavirus 2 by RT PCR NEGATIVE NEGATIVE Final    Comment: (NOTE) SARS-CoV-2 target nucleic acids are NOT DETECTED.  The SARS-CoV-2 RNA is generally detectable in upper respiratory specimens during the acute phase of infection. The lowest concentration of SARS-CoV-2 viral copies this assay can detect is 138 copies/mL. A negative result does not preclude SARS-Cov-2 infection and should not be used as the sole basis for treatment or other patient management decisions. A negative result may occur with  improper specimen collection/handling, submission of specimen other than nasopharyngeal swab, presence of viral  mutation(s) within the areas targeted by this assay, and inadequate number of viral copies(<138 copies/mL). A negative result must be combined with clinical observations, patient history, and epidemiological information. The expected result is Negative.  Fact Sheet for Patients:  BloggerCourse.com  Fact Sheet for Healthcare Providers:  SeriousBroker.it  This test is no t yet approved or cleared by the [place] FDA and  has been authorized for detection and/or diagnosis of SARS-CoV-2 by FDA under an Emergency Use Authorization (EUA). This EUA will remain  in effect (meaning this test can be used) for the duration of the COVID-19 declaration under Section 564(b)(1) of the Act, 21 U.S.C.section 360bbb-3(b)(1), unless the authorization is terminated  or revoked sooner.       Influenza A by PCR NEGATIVE NEGATIVE Final   Influenza B by PCR NEGATIVE NEGATIVE Final    Comment: (NOTE) The Xpert Xpress SARS-CoV-2/FLU/RSV plus assay is intended as an aid in the diagnosis of influenza from Nasopharyngeal swab specimens and should not be used as a sole basis for treatment. Nasal washings and aspirates are unacceptable for Xpert Xpress SARS-CoV-2/FLU/RSV testing.  Fact Sheet for Patients: BloggerCourse.com  Fact Sheet for Healthcare Providers: SeriousBroker.it  This test is not yet approved or cleared by the [place] FDA and has been authorized for detection and/or diagnosis of SARS-CoV-2 by FDA under an Emergency Use Authorization (EUA). This EUA will remain in effect (meaning this test can be used) for the duration of the COVID-19 declaration under Section 564(b)(1) of the Act, 21 U.S.C. section 360bbb-3(b)(1), unless the authorization is terminated or revoked.  Performed at [facility], [address], [place], [place] [address]   Culture, blood (Routine x 2)     Status:  None (Preliminary result)   Collection Time: 05/14/20  5:38 PM   Specimen: BLOOD RIGHT HAND  Result Value Ref Range Status   Specimen Description BLOOD RIGHT HAND  Final   Special Requests   Final    BOTTLES DRAWN AEROBIC AND ANAEROBIC Blood Culture  results may not be optimal due to an inadequate volume of blood received in culture bottles   Culture   Final    NO GROWTH 3 DAYS Performed at [facility], [address] [place], [place] [address]    Report Status PENDING  Incomplete  Culture, blood (Routine x 2)     Status: None (Preliminary result)   Collection Time: 05/14/20  5:40 PM   Specimen: BLOOD LEFT FOREARM  Result Value Ref Range Status   Specimen Description   Final    BLOOD LEFT FOREARM BOTTLES DRAWN AEROBIC AND ANAEROBIC Blood Culture results may not be optimal due to an inadequate volume of blood received in culture bottles   Special Requests NONE  Final   Culture   Final    NO GROWTH 3 DAYS Performed at [facility], [address] [place], [place] [address]    Report Status PENDING  Incomplete  Urine culture     Status: Abnormal   Collection Time: 05/14/20  5:49 PM   Specimen: In/Out Cath Urine  Result Value Ref Range Status   Specimen Description   Final    IN/OUT CATH URINE Performed at [facility], [address] [place], [place] [address]    Special Requests   Final    NONE Performed at [facility], [address] [place], [place] [address]    Culture (A)  Final    >=100,000 COLONIES/mL ENTEROCOCCUS FAECIUM VANCOMYCIN RESISTANT ENTEROCOCCUS ISOLATED    Report Status 05/17/2020 FINAL  Final   Organism ID, Bacteria ENTEROCOCCUS FAECIUM (A)  Final      Susceptibility   Enterococcus faecium - MIC*    AMPICILLIN >=32 RESISTANT Resistant     NITROFURANTOIN 256 RESISTANT Resistant     VANCOMYCIN >=32 RESISTANT Resistant     LINEZOLID 2 SENSITIVE Sensitive     * >=100,000 COLONIES/mL ENTEROCOCCUS FAECIUM  MRSA PCR Screening     Status: None   Collection  Time: 05/15/20 12:08 AM   Specimen: Nasal Mucosa; Nasopharyngeal  Result Value Ref Range Status   MRSA by PCR NEGATIVE NEGATIVE Final    Comment:        The GeneXpert MRSA Assay (FDA approved for NASAL specimens only), is one component of a comprehensive MRSA colonization surveillance program. It is not intended to diagnose MRSA infection nor to guide or monitor treatment for MRSA infections. Performed at [facility], [address] [place], [place] [address]     Recent Labs  Lab 05/14/20 1749  LIPASE 59*   No results for input(s): AMMONIA in the last 168 hours.  Cardiac Enzymes: Recent Labs  Lab 05/14/20 2101  CKTOTAL 49   BNP (last 3 results) Recent Labs    04/26/20 0735 05/14/20 1749  BNP 120.0* 76.0    Clayten Allcock S Coryn Mosso   [facility] If 7PM-7AM, please contact night-coverage at [email or web address], Office  [phone]   05/17/2020, 2:48 PM  LOS: 3 days

## 2020-05-17 NOTE — Care Management Important Message (Signed)
Important Message  Patient Details  Name: Luke Crosby MRN: [id number] Date of Birth: 11/08/1940   Medicare Important Message Given:  Yes     Corey Harold 05/17/2020, 4:26 PM

## 2020-05-18 DIAGNOSIS — I611 Nontraumatic intracerebral hemorrhage in hemisphere, cortical: Secondary | ICD-10-CM | POA: Diagnosis not present

## 2020-05-18 DIAGNOSIS — N179 Acute kidney failure, unspecified: Secondary | ICD-10-CM | POA: Diagnosis not present

## 2020-05-18 DIAGNOSIS — A419 Sepsis, unspecified organism: Secondary | ICD-10-CM | POA: Diagnosis not present

## 2020-05-18 DIAGNOSIS — E870 Hyperosmolality and hypernatremia: Secondary | ICD-10-CM | POA: Diagnosis not present

## 2020-05-18 LAB — COMPREHENSIVE METABOLIC PANEL
ALT: 87 U/L — ABNORMAL HIGH (ref 0–44)
AST: 55 U/L — ABNORMAL HIGH (ref 15–41)
Albumin: 2.5 g/dL — ABNORMAL LOW (ref 3.5–5.0)
Alkaline Phosphatase: 87 U/L (ref 38–126)
Anion gap: 5 (ref 5–15)
BUN: 41 mg/dL — ABNORMAL HIGH (ref 8–23)
CO2: 27 mmol/L (ref 22–32)
Calcium: 8.8 mg/dL — ABNORMAL LOW (ref 8.9–10.3)
Chloride: 117 mmol/L — ABNORMAL HIGH (ref 98–111)
Creatinine, Ser: 1.15 mg/dL (ref 0.61–1.24)
GFR, Estimated: >60 mL/min (ref >60)
Glucose, Bld: 147 mg/dL — ABNORMAL HIGH (ref 70–99)
Potassium: 3.5 mmol/L (ref 3.5–5.1)
Sodium: 149 mmol/L — ABNORMAL HIGH (ref 135–145)
Total Bilirubin: 1.7 mg/dL — ABNORMAL HIGH (ref 0.3–1.2)
Total Protein: 5.7 g/dL — ABNORMAL LOW (ref 6.5–8.1)

## 2020-05-18 LAB — BASIC METABOLIC PANEL
Anion gap: 7 (ref 5–15)
Anion gap: 7 (ref 5–15)
BUN: 39 mg/dL — ABNORMAL HIGH (ref 8–23)
BUN: 43 mg/dL — ABNORMAL HIGH (ref 8–23)
CO2: 25 mmol/L (ref 22–32)
CO2: 26 mmol/L (ref 22–32)
Calcium: 8.8 mg/dL — ABNORMAL LOW (ref 8.9–10.3)
Calcium: 9 mg/dL (ref 8.9–10.3)
Chloride: 117 mmol/L — ABNORMAL HIGH (ref 98–111)
Chloride: 121 mmol/L — ABNORMAL HIGH (ref 98–111)
Creatinine, Ser: 1.12 mg/dL (ref 0.61–1.24)
Creatinine, Ser: 1.16 mg/dL (ref 0.61–1.24)
GFR, Estimated: >60 mL/min (ref >60)
GFR, Estimated: >60 mL/min (ref >60)
Glucose, Bld: 133 mg/dL — ABNORMAL HIGH (ref 70–99)
Glucose, Bld: 143 mg/dL — ABNORMAL HIGH (ref 70–99)
Potassium: 3.5 mmol/L (ref 3.5–5.1)
Potassium: 3.6 mmol/L (ref 3.5–5.1)
Sodium: 150 mmol/L — ABNORMAL HIGH (ref 135–145)
Sodium: 153 mmol/L — ABNORMAL HIGH (ref 135–145)

## 2020-05-18 LAB — CBC
HCT: 42.7 % (ref 39.0–52.0)
Hemoglobin: 12.6 g/dL — ABNORMAL LOW (ref 13.0–17.0)
MCH: 29.6 pg (ref 26.0–34.0)
MCHC: 29.5 g/dL — ABNORMAL LOW (ref 30.0–36.0)
MCV: 100.5 fL — ABNORMAL HIGH (ref 80.0–100.0)
Platelets: 70 10*3/uL — ABNORMAL LOW (ref 150–400)
RBC: 4.25 MIL/uL (ref 4.22–5.81)
RDW: 15.6 % — ABNORMAL HIGH (ref 11.5–15.5)
WBC: 11.3 10*3/uL — ABNORMAL HIGH (ref 4.0–10.5)
nRBC: 0 % (ref 0.0–0.2)

## 2020-05-18 LAB — GLUCOSE, CAPILLARY
Glucose-Capillary: 110 mg/dL — ABNORMAL HIGH (ref 70–99)
Glucose-Capillary: 117 mg/dL — ABNORMAL HIGH (ref 70–99)
Glucose-Capillary: 119 mg/dL — ABNORMAL HIGH (ref 70–99)
Glucose-Capillary: 123 mg/dL — ABNORMAL HIGH (ref 70–99)
Glucose-Capillary: 127 mg/dL — ABNORMAL HIGH (ref 70–99)
Glucose-Capillary: 129 mg/dL — ABNORMAL HIGH (ref 70–99)
Glucose-Capillary: 157 mg/dL — ABNORMAL HIGH (ref 70–99)

## 2020-05-18 NOTE — Progress Notes (Signed)
[facility]  PROGRESS NOTE  Luke Crosby BJY:[id number] DOB: [birth year] DOA: 05/14/2020 PCP: Gareth MorganKnowlton, [name], MD   Brief HPI:   79 year old male with history of bilateral pulmonary embolism, right lower extremity DVT in September 2021, atrial fibrillation with sinus node dysfunction, history of pacemaker placement, hypertension, gout who was admitted to [facility] from 04/26/2020 until 05/13/2020 due to intracranial hemorrhage after he had mechanical fall with head injury while anticoagulated on Eliquis for A. fib and DVT/PE.  Anticoagulation was stopped and patient was discharged home with home health services after insurance refused to pay for inpatient rehab and son was not happy going to skilled nursing facility rehab. Patient return to [facility] on 05/14/2020 with worsening mental status and found to be severely dehydrated with sodium of 171 and chloride 130.  He had elevated creatinine of 4.0.   Subjective   Patient seen examined, he is more alert though noncommunicative.   Assessment/Plan:     1. Severe sepsis-patient presented with altered mental status, hypernatremia, abnormal UA.  Urine culture growing greater than 100,000 colonies per mL Enterococcus faecium, VRE.  Vancomycin was discontinued and started on Zyvox.  Cefepime was also discontinued.  Patient platelets as low, 81,000.  Will closely monitor patient's  platelets in the hospital. 2. UTI-started on Zyvox as above. 3. Acute kidney injury-likely from dehydration, creatinine on admission was 4.0, he has baseline creatinine 0.9 as of 05/03/2020.  Creatinine has improved to 1.12.  Continue D5W at 75 mill per hour.  Follow BMP in am. 4. Hypernatremia-patient presents with sodium 171, started on D5W.  Sodium is now down to 150.  Continue  D5W  75 mill per hour.  Follow BMP in am. 5. Traumatic left frontal intraparenchymal hemorrhage-small volume SDH, date of injury 04/26/2020.  S/p reversal with Kcentra and vitamin K.   Seen by neurosurgery, no intervention advised.  Repeat CT head on 05/14/2020 shows residual intracranial hemorrhage, no acute findings. 6. Chronic atrial fibrillation with RVR- CHA2DS2VASc score is 3, HAS-BLED score 3, no further anticoagulation due to traumatic ICH.  Continue IV metoprolol 5 mg every 8 hours. 7. Pulmonary embolism with extensive lower extremity DVT-diagnosed on 03/06/2020, Eliquis was discontinued traumatic ICH. 8. Transaminitis-in the setting of hypotension and possible shock liver.  Significantly improved, follow LFTs in a.m. 9. Metabolic encephalopathy-multifactorial due to sepsis and hypernatremia.     COVID-19 Labs  No results for input(s): DDIMER, FERRITIN, LDH, CRP in the last 72 hours.  Lab Results  Component Value Date   SARSCOV2NAA NEGATIVE 05/14/2020   SARSCOV2NAA NEGATIVE 04/26/2020   SARSCOV2NAA NEGATIVE 03/21/2020   SARSCOV2NAA NEGATIVE 03/06/2020     Scheduled medications:   . Chlorhexidine Gluconate Cloth  6 each Topical Daily  . mouth rinse  15 mL Mouth Rinse BID  . metoprolol tartrate  5 mg Intravenous Q8H         CBG: Recent Labs  Lab 05/18/20 0008 05/18/20 0335 05/18/20 0745 05/18/20 1150 05/18/20 1610  GLUCAP 129* 123* 127* 110* 157*    SpO2: 99 % O2 Flow Rate (L/min): 2 L/min FiO2 (%): (!) 0 %    CBC: Recent Labs  Lab 05/14/20 1733 05/15/20 0420 05/17/20 0355 05/18/20 0749  WBC 13.6* 13.7* 11.6* 11.3*  NEUTROABS 11.3*  --   --   --   HGB 17.5* 13.8 12.6* 12.6*  HCT 59.2* 48.8 43.4 42.7  MCV 102.2* 104.9* 102.6* 100.5*  PLT 269 157 81* 70*    Basic Metabolic Panel: Recent Labs  Lab 05/17/20 1158 05/17/20 1800 05/17/20 2359 05/18/20 0749 05/18/20 1200  NA 156* 155* 153* 149* 150*  K 3.6 3.6 3.5 3.5 3.6  CL 123* 123* 121* 117* 117*  CO2 25 25 25 27 26   GLUCOSE 157* 142* 143* 147* 133*  BUN 54* 49* 43* 41* 39*  CREATININE 1.34* 1.22 1.16 1.15 1.12  CALCIUM 9.1 8.9 8.8* 8.8* 9.0     Liver Function  Tests: Recent Labs  Lab 05/14/20 1733 05/15/20 0420 05/18/20 0749  AST 64* 44* 55*  ALT 116* 81* 87*  ALKPHOS 112 78 87  BILITOT 1.8* 1.3* 1.7*  PROT 8.0 5.8* 5.7*  ALBUMIN 3.6 2.7* 2.5*     Antibiotics: Anti-infectives (From admission, onward)   Start     Dose/Rate Route Frequency Ordered Stop   05/17/20 1600  linezolid (ZYVOX) IVPB 600 mg        600 mg 300 mL/hr over 60 Minutes Intravenous Every 12 hours 05/17/20 1453 05/22/20 1559   05/16/20 1800  vancomycin (VANCOREADY) IVPB 750 mg/150 mL  Status:  Discontinued        750 mg 150 mL/hr over 60 Minutes Intravenous Every 48 hours 05/14/20 1842 05/16/20 1009   05/15/20 1800  ceFEPIme (MAXIPIME) 2 g in sodium chloride 0.9 % 100 mL IVPB  Status:  Discontinued       Note to Pharmacy: Adjust dosage as needed please. TY.   2 g 200 mL/hr over 30 Minutes Intravenous Every 24 hours 05/14/20 2325 05/17/20 1453   05/15/20 1200  Ampicillin-Sulbactam (UNASYN) 3 g in sodium chloride 0.9 % 100 mL IVPB  Status:  Discontinued        3 g 200 mL/hr over 30 Minutes Intravenous Every 24 hours 05/14/20 2055 05/15/20 1408   05/14/20 1800  ceFEPIme (MAXIPIME) 2 g in sodium chloride 0.9 % 100 mL IVPB        2 g 200 mL/hr over 30 Minutes Intravenous  Once 05/14/20 1752 05/14/20 1854   05/14/20 1800  Ampicillin-Sulbactam (UNASYN) 3 g in sodium chloride 0.9 % 100 mL IVPB  Status:  Discontinued        3 g 200 mL/hr over 30 Minutes Intravenous Every 6 hours 05/14/20 1752 05/14/20 2055   05/14/20 1800  vancomycin (VANCOCIN) IVPB 1000 mg/200 mL premix  Status:  Discontinued        1,000 mg 200 mL/hr over 60 Minutes Intravenous  Once 05/14/20 1752 05/14/20 1755   05/14/20 1800  vancomycin (VANCOREADY) IVPB 1500 mg/300 mL        1,500 mg 150 mL/hr over 120 Minutes Intravenous  Once 05/14/20 1755 05/14/20 2031       DVT prophylaxis: SCDs  Code Status: DNR  Family Communication: Discussed with patient's son at bedside      Objective   Vitals:    05/18/20 1200 05/18/20 1300 05/18/20 1400 05/18/20 1500  BP: 100/80 94/69  92/65  Pulse: (!) 105 90 100 84  Resp: (!) 29 (!) 28 (!) 28 (!) 29  Temp: 99 F (37.2 C) 99 F (37.2 C) 99.1 F (37.3 C) 99.1 F (37.3 C)  TempSrc:      SpO2: 100% 100% 100% 99%  Weight:      Height:        Intake/Output Summary (Last 24 hours) at 05/18/2020 1619 Last data filed at 05/18/2020 1500 Gross per 24 hour  Intake 3555.52 ml  Output 1500 ml  Net 2055.52 ml    11/23 1901 - 11/25 0700 In:  4593.8 [I.V.:3893.8] Out: 2650 [Urine:2650]  Filed Weights   05/14/20 1730 05/15/20 0500  Weight: 68 kg 65.9 kg    Physical Examination:    General-appears in no acute distress  Heart-S1-S2, regular, no murmur auscultated  Lungs-clear to auscultation bilaterally, no wheezing or crackles auscultated  Abdomen-soft, nontender, no organomegaly  Extremities-no edema in the lower extremities  Neuro-alert, not communicating   Status is: Inpatient  Dispo: The patient is from: Home              Anticipated d/c is to: Skilled nursing facility              Anticipated d/c date is: 05/22/2020              Patient currently not medically stable for discharge  Barrier to discharge-ongoing treatment for hypernatremia, UTI     Data Reviewed:   Recent Results (from the past 240 hour(s))  Resp Panel by RT-PCR (Flu A&B, Covid) Nasopharyngeal Swab     Status: None   Collection Time: 05/14/20  5:37 PM   Specimen: Nasopharyngeal Swab; Nasopharyngeal(NP) swabs in vial transport medium  Result Value Ref Range Status   SARS Coronavirus 2 by RT PCR NEGATIVE NEGATIVE Final    Comment: (NOTE) SARS-CoV-2 target nucleic acids are NOT DETECTED.  The SARS-CoV-2 RNA is generally detectable in upper respiratory specimens during the acute phase of infection. The lowest concentration of SARS-CoV-2 viral copies this assay can detect is 138 copies/mL. A negative result does not preclude SARS-Cov-2 infection and  should not be used as the sole basis for treatment or other patient management decisions. A negative result may occur with  improper specimen collection/handling, submission of specimen other than nasopharyngeal swab, presence of viral mutation(s) within the areas targeted by this assay, and inadequate number of viral copies(<138 copies/mL). A negative result must be combined with clinical observations, patient history, and epidemiological information. The expected result is Negative.  Fact Sheet for Patients:  BloggerCourse.com  Fact Sheet for Healthcare Providers:  SeriousBroker.it  This test is no t yet approved or cleared by the [place] FDA and  has been authorized for detection and/or diagnosis of SARS-CoV-2 by FDA under an Emergency Use Authorization (EUA). This EUA will remain  in effect (meaning this test can be used) for the duration of the COVID-19 declaration under Section 564(b)(1) of the Act, 21 U.S.C.section 360bbb-3(b)(1), unless the authorization is terminated  or revoked sooner.       Influenza A by PCR NEGATIVE NEGATIVE Final   Influenza B by PCR NEGATIVE NEGATIVE Final    Comment: (NOTE) The Xpert Xpress SARS-CoV-2/FLU/RSV plus assay is intended as an aid in the diagnosis of influenza from Nasopharyngeal swab specimens and should not be used as a sole basis for treatment. Nasal washings and aspirates are unacceptable for Xpert Xpress SARS-CoV-2/FLU/RSV testing.  Fact Sheet for Patients: BloggerCourse.com  Fact Sheet for Healthcare Providers: SeriousBroker.it  This test is not yet approved or cleared by the [place] FDA and has been authorized for detection and/or diagnosis of SARS-CoV-2 by FDA under an Emergency Use Authorization (EUA). This EUA will remain in effect (meaning this test can be used) for the duration of the COVID-19 declaration  under Section 564(b)(1) of the Act, 21 U.S.C. section 360bbb-3(b)(1), unless the authorization is terminated or revoked.  Performed at [facility], [address] [place], [place] [address]   Culture, blood (Routine x 2)     Status: None (Preliminary result)   Collection  Time: 05/14/20  5:38 PM   Specimen: BLOOD RIGHT HAND  Result Value Ref Range Status   Specimen Description BLOOD RIGHT HAND  Final   Special Requests   Final    BOTTLES DRAWN AEROBIC AND ANAEROBIC Blood Culture results may not be optimal due to an inadequate volume of blood received in culture bottles   Culture   Final    NO GROWTH 4 DAYS Performed at [facility], [address], [place], [place] [address]    Report Status PENDING  Incomplete  Culture, blood (Routine x 2)     Status: None (Preliminary result)   Collection Time: 05/14/20  5:40 PM   Specimen: BLOOD LEFT FOREARM  Result Value Ref Range Status   Specimen Description   Final    BLOOD LEFT FOREARM BOTTLES DRAWN AEROBIC AND ANAEROBIC Blood Culture results may not be optimal due to an inadequate volume of blood received in culture bottles   Special Requests NONE  Final   Culture   Final    NO GROWTH 4 DAYS Performed at [facility], [address], [place], [place] [address]    Report Status PENDING  Incomplete  Urine culture     Status: Abnormal   Collection Time: 05/14/20  5:49 PM   Specimen: In/Out Cath Urine  Result Value Ref Range Status   Specimen Description   Final    IN/OUT CATH URINE Performed at [facility], [address], [place], [place] [address]    Special Requests   Final    NONE Performed at [facility], [address], [place], [place] [address]    Culture (A)  Final    >=100,000 COLONIES/mL ENTEROCOCCUS FAECIUM VANCOMYCIN RESISTANT ENTEROCOCCUS ISOLATED    Report Status 05/17/2020 FINAL  Final   Organism ID, Bacteria ENTEROCOCCUS FAECIUM (A)  Final      Susceptibility   Enterococcus faecium - MIC*    AMPICILLIN >=32  RESISTANT Resistant     NITROFURANTOIN 256 RESISTANT Resistant     VANCOMYCIN >=32 RESISTANT Resistant     LINEZOLID 2 SENSITIVE Sensitive     * >=100,000 COLONIES/mL ENTEROCOCCUS FAECIUM  MRSA PCR Screening     Status: None   Collection Time: 05/15/20 12:08 AM   Specimen: Nasal Mucosa; Nasopharyngeal  Result Value Ref Range Status   MRSA by PCR NEGATIVE NEGATIVE Final    Comment:        The GeneXpert MRSA Assay (FDA approved for NASAL specimens only), is one component of a comprehensive MRSA colonization surveillance program. It is not intended to diagnose MRSA infection nor to guide or monitor treatment for MRSA infections. Performed at [facility], [address], [place], [place] [address]     Recent Labs  Lab 05/14/20 1749  LIPASE 59*   No results for input(s): AMMONIA in the last 168 hours.  Cardiac Enzymes: Recent Labs  Lab 05/14/20 2101  CKTOTAL 49   BNP (last 3 results) Recent Labs    04/26/20 0735 05/14/20 1749  BNP 120.0* 76.0    [place] [address] [place]   [facility] If 7PM-7AM, please contact night-coverage at [email or web address], Office  [phone]   05/18/2020, 4:19 PM  LOS: 4 days

## 2020-05-19 DIAGNOSIS — Z86711 Personal history of pulmonary embolism: Secondary | ICD-10-CM | POA: Diagnosis not present

## 2020-05-19 DIAGNOSIS — R945 Abnormal results of liver function studies: Secondary | ICD-10-CM

## 2020-05-19 DIAGNOSIS — N179 Acute kidney failure, unspecified: Secondary | ICD-10-CM | POA: Diagnosis not present

## 2020-05-19 DIAGNOSIS — A419 Sepsis, unspecified organism: Secondary | ICD-10-CM | POA: Diagnosis not present

## 2020-05-19 LAB — HEPATIC FUNCTION PANEL
ALT: 77 U/L — ABNORMAL HIGH (ref 0–44)
AST: 45 U/L — ABNORMAL HIGH (ref 15–41)
Albumin: 2.3 g/dL — ABNORMAL LOW (ref 3.5–5.0)
Alkaline Phosphatase: 89 U/L (ref 38–126)
Bilirubin, Direct: 0.7 mg/dL — ABNORMAL HIGH (ref 0.0–0.2)
Indirect Bilirubin: 1 mg/dL — ABNORMAL HIGH (ref 0.3–0.9)
Total Bilirubin: 1.7 mg/dL — ABNORMAL HIGH (ref 0.3–1.2)
Total Protein: 5.4 g/dL — ABNORMAL LOW (ref 6.5–8.1)

## 2020-05-19 LAB — BASIC METABOLIC PANEL
Anion gap: 9 (ref 5–15)
BUN: 31 mg/dL — ABNORMAL HIGH (ref 8–23)
CO2: 23 mmol/L (ref 22–32)
Calcium: 8.6 mg/dL — ABNORMAL LOW (ref 8.9–10.3)
Chloride: 113 mmol/L — ABNORMAL HIGH (ref 98–111)
Creatinine, Ser: 0.94 mg/dL (ref 0.61–1.24)
GFR, Estimated: >60 mL/min (ref >60)
Glucose, Bld: 162 mg/dL — ABNORMAL HIGH (ref 70–99)
Potassium: 3.5 mmol/L (ref 3.5–5.1)
Sodium: 145 mmol/L (ref 135–145)

## 2020-05-19 LAB — GLUCOSE, CAPILLARY
Glucose-Capillary: 126 mg/dL — ABNORMAL HIGH (ref 70–99)
Glucose-Capillary: 138 mg/dL — ABNORMAL HIGH (ref 70–99)
Glucose-Capillary: 120 mg/dL — ABNORMAL HIGH (ref 70–99)

## 2020-05-19 LAB — CULTURE, BLOOD (ROUTINE X 2)
Culture: NO GROWTH
Culture: NO GROWTH

## 2020-05-19 MED ORDER — MORPHINE SULFATE (PF) 2 MG/ML IV SOLN
0.5000 mg | INTRAVENOUS | Status: DC | PRN
  Administered 2020-05-19 – 2020-05-20 (×3): 0.5 mg via INTRAVENOUS
  Filled 2020-05-19 (×3): qty 1

## 2020-05-19 MED ORDER — SODIUM CHLORIDE 0.9 % IV SOLN
INTRAVENOUS | Status: DC | PRN
  Administered 2020-05-19: 500 mL via INTRAVENOUS

## 2020-05-19 MED ORDER — SCOPOLAMINE 1 MG/3DAYS TD PT72
1.0000 | MEDICATED_PATCH | TRANSDERMAL | Status: DC
  Administered 2020-05-19: 1.5 mg via TRANSDERMAL
  Filled 2020-05-19 (×3): qty 1

## 2020-05-19 NOTE — Progress Notes (Signed)
[facility]  PROGRESS NOTE  Luke Crosby GQQ:[id number] DOB: September 15, 1940 DOA: 05/14/2020 PCP: Gareth Morgan, MD   Brief HPI:   79 year old male with history of bilateral pulmonary embolism, right lower extremity DVT in September 2021, atrial fibrillation with sinus node dysfunction, history of pacemaker placement, hypertension, gout who was admitted to [facility] from 04/26/2020 until 05/13/2020 due to intracranial hemorrhage after he had mechanical fall with head injury while anticoagulated on Eliquis for A. fib and DVT/PE.  Anticoagulation was stopped and patient was discharged home with home health services after insurance refused to pay for inpatient rehab and son was not happy going to skilled nursing facility rehab. Patient return to [facility] on 05/14/2020 with worsening mental status and found to be severely dehydrated with sodium of 171 and chloride 130.  He had elevated creatinine of 4.0.   Subjective   Patient continues to lethargic, not communicating, no p.o. intake.   Assessment/Plan:     1. Severe sepsis-patient presented with altered mental status, hypernatremia, abnormal UA.  Urine culture growing greater than 100,000 colonies per mL Enterococcus faecium, VRE.  Vancomycin was discontinued and started on Zyvox.  Cefepime was also discontinued.  Patient platelets as low, 70,000. 2. UTI-started on Zyvox as above. 3. Acute kidney injury-likely from dehydration, creatinine on admission was 4.0, he has baseline creatinine 0.9 as of 05/03/2020.  Creatinine has improved to 1.12.   4. Hypernatremia-patient presents with sodium 171, started on D5W.  Sodium is now down to 145.  Will discontinue D5W. 5. Traumatic left frontal intraparenchymal hemorrhage-small volume SDH, date of injury 04/26/2020.  S/p reversal with Kcentra and vitamin K.  Seen by neurosurgery, no intervention advised.  Repeat CT head on 05/14/2020 shows residual intracranial hemorrhage, no acute  findings. 6. Chronic atrial fibrillation with RVR- CHA2DS2VASc score is 3, HAS-BLED score 3, no further anticoagulation due to traumatic ICH.  Continue IV metoprolol 5 mg every 8 hours. 7. Pulmonary embolism with extensive lower extremity DVT-diagnosed on 03/06/2020, Eliquis was discontinued traumatic ICH. 8. Transaminitis-in the setting of hypotension and possible shock liver.  Significantly improved. 9. Metabolic encephalopathy-multifactorial due to sepsis and hypernatremia. 10. Goals of care-discussed with patient's son at bedside.  Patient sodium has improved however his mental status continues to be same.  Patient is now eating and drinking.  Patient had similar episode at [facility] where he required NG tube feeding.  Patient son is not interested in initiating artificial nutrition at this time.  Discussed with him poor prognosis despite aggressive treatment.  Patient has had multiple hospitalizations over the past few months and has continued to decline medically.  Discussed natural disease trajectory, and end-of-life care.  Patient son is interested in comfort measures only.  He wants to take his father home with hospice.  Will discontinue antibiotics and IV fluids.  Discontinue labs. Will initiate comfort measures.  TOC will be consulted for home hospice.      COVID-19 Labs  No results for input(s): DDIMER, FERRITIN, LDH, CRP in the last 72 hours.  Lab Results  Component Value Date   SARSCOV2NAA NEGATIVE 05/14/2020   SARSCOV2NAA NEGATIVE 04/26/2020   SARSCOV2NAA NEGATIVE 03/21/2020   SARSCOV2NAA NEGATIVE 03/06/2020     Scheduled medications:   . Chlorhexidine Gluconate Cloth  6 each Topical Daily  . mouth rinse  15 mL Mouth Rinse BID  . metoprolol tartrate  5 mg Intravenous Q8H  . scopolamine  1 patch Transdermal Q72H         CBG:  Recent Labs  Lab 05/18/20 2039 05/18/20 2331 05/19/20 0509 05/19/20 0724 05/19/20 1112  GLUCAP 117* 119* 126* 138* 120*    SpO2: 100  % O2 Flow Rate (L/min): 2 L/min FiO2 (%): (!) 0 %    CBC: Recent Labs  Lab 05/14/20 1733 05/15/20 0420 05/17/20 0355 05/18/20 0749  WBC 13.6* 13.7* 11.6* 11.3*  NEUTROABS 11.3*  --   --   --   HGB 17.5* 13.8 12.6* 12.6*  HCT 59.2* 48.8 43.4 42.7  MCV 102.2* 104.9* 102.6* 100.5*  PLT 269 157 81* 70*    Basic Metabolic Panel: Recent Labs  Lab 05/17/20 1800 05/17/20 2359 05/18/20 0749 05/18/20 1200 05/19/20 0644  NA 155* 153* 149* 150* 145  K 3.6 3.5 3.5 3.6 3.5  CL 123* 121* 117* 117* 113*  CO2 25 25 27 26 23   GLUCOSE 142* 143* 147* 133* 162*  BUN 49* 43* 41* 39* 31*  CREATININE 1.22 1.16 1.15 1.12 0.94  CALCIUM 8.9 8.8* 8.8* 9.0 8.6*     Liver Function Tests: Recent Labs  Lab 05/14/20 1733 05/15/20 0420 05/18/20 0749 05/19/20 0644  AST 64* 44* 55* 45*  ALT 116* 81* 87* 77*  ALKPHOS 112 78 87 89  BILITOT 1.8* 1.3* 1.7* 1.7*  PROT 8.0 5.8* 5.7* 5.4*  ALBUMIN 3.6 2.7* 2.5* 2.3*     Antibiotics: Anti-infectives (From admission, onward)   Start     Dose/Rate Route Frequency Ordered Stop   05/17/20 1600  linezolid (ZYVOX) IVPB 600 mg  Status:  Discontinued        600 mg 300 mL/hr over 60 Minutes Intravenous Every 12 hours 05/17/20 1453 05/19/20 1305   05/16/20 1800  vancomycin (VANCOREADY) IVPB 750 mg/150 mL  Status:  Discontinued        750 mg 150 mL/hr over 60 Minutes Intravenous Every 48 hours 05/14/20 1842 05/16/20 1009   05/15/20 1800  ceFEPIme (MAXIPIME) 2 g in sodium chloride 0.9 % 100 mL IVPB  Status:  Discontinued       Note to Pharmacy: Adjust dosage as needed please. TY.   2 g 200 mL/hr over 30 Minutes Intravenous Every 24 hours 05/14/20 2325 05/17/20 1453   05/15/20 1200  Ampicillin-Sulbactam (UNASYN) 3 g in sodium chloride 0.9 % 100 mL IVPB  Status:  Discontinued        3 g 200 mL/hr over 30 Minutes Intravenous Every 24 hours 05/14/20 2055 05/15/20 1408   05/14/20 1800  ceFEPIme (MAXIPIME) 2 g in sodium chloride 0.9 % 100 mL IVPB        2  g 200 mL/hr over 30 Minutes Intravenous  Once 05/14/20 1752 05/14/20 1854   05/14/20 1800  Ampicillin-Sulbactam (UNASYN) 3 g in sodium chloride 0.9 % 100 mL IVPB  Status:  Discontinued        3 g 200 mL/hr over 30 Minutes Intravenous Every 6 hours 05/14/20 1752 05/14/20 2055   05/14/20 1800  vancomycin (VANCOCIN) IVPB 1000 mg/200 mL premix  Status:  Discontinued        1,000 mg 200 mL/hr over 60 Minutes Intravenous  Once 05/14/20 1752 05/14/20 1755   05/14/20 1800  vancomycin (VANCOREADY) IVPB 1500 mg/300 mL        1,500 mg 150 mL/hr over 120 Minutes Intravenous  Once 05/14/20 1755 05/14/20 2031       DVT prophylaxis: SCDs  Code Status: DNR  Family Communication: Discussed with patient's son at bedside      Objective   Vitals:  05/19/20 0735 05/19/20 1000 05/19/20 1200 05/19/20 1300  BP:  96/67 (!) 89/59 (!) 81/59  Pulse:  (!) 106 72 91  Resp:  20 (!) 36 18  Temp: 98.5 F (36.9 C) 99 F (37.2 C) 99.3 F (37.4 C) 99.5 F (37.5 C)  TempSrc:      SpO2:  99% 100% 100%  Weight:      Height:        Intake/Output Summary (Last 24 hours) at 05/19/2020 1308 Last data filed at 05/19/2020 1239 Gross per 24 hour  Intake 2481.33 ml  Output 500 ml  Net 1981.33 ml    11/24 1901 - 11/26 0700 In: 4572.5 [I.V.:3937.3] Out: 1100 [Urine:1100]  Filed Weights   05/14/20 1730 05/15/20 0500  Weight: 68 kg 65.9 kg    Physical Examination:   General-appears lethargic Heart-S1-S2, regular, no murmur auscultated Lungs-clear to auscultation bilaterally, no wheezing or crackles auscultated Abdomen-soft, nontender, no organomegaly Extremities-no edema in the lower extremities Neuro-alert, not following commands  Status is: Inpatient  Dispo: The patient is from: Home              Anticipated d/c is to: Skilled nursing facility              Anticipated d/c date is: 05/22/2020              Patient currently not medically stable for discharge  Barrier to discharge-ongoing  treatment for hypernatremia, UTI     Data Reviewed:   Recent Results (from the past 240 hour(s))  Resp Panel by RT-PCR (Flu A&B, Covid) Nasopharyngeal Swab     Status: None   Collection Time: 05/14/20  5:37 PM   Specimen: Nasopharyngeal Swab; Nasopharyngeal(NP) swabs in vial transport medium  Result Value Ref Range Status   SARS Coronavirus 2 by RT PCR NEGATIVE NEGATIVE Final    Comment: (NOTE) SARS-CoV-2 target nucleic acids are NOT DETECTED.  The SARS-CoV-2 RNA is generally detectable in upper respiratory specimens during the acute phase of infection. The lowest concentration of SARS-CoV-2 viral copies this assay can detect is 138 copies/mL. A negative result does not preclude SARS-Cov-2 infection and should not be used as the sole basis for treatment or other patient management decisions. A negative result may occur with  improper specimen collection/handling, submission of specimen other than nasopharyngeal swab, presence of viral mutation(s) within the areas targeted by this assay, and inadequate number of viral copies(<138 copies/mL). A negative result must be combined with clinical observations, patient history, and epidemiological information. The expected result is Negative.  Fact Sheet for Patients:  BloggerCourse.com  Fact Sheet for Healthcare Providers:  SeriousBroker.it  This test is no t yet approved or cleared by the [place] FDA and  has been authorized for detection and/or diagnosis of SARS-CoV-2 by FDA under an Emergency Use Authorization (EUA). This EUA will remain  in effect (meaning this test can be used) for the duration of the COVID-19 declaration under Section 564(b)(1) of the Act, 21 U.S.C.section 360bbb-3(b)(1), unless the authorization is terminated  or revoked sooner.       Influenza A by PCR NEGATIVE NEGATIVE Final   Influenza B by PCR NEGATIVE NEGATIVE Final    Comment: (NOTE) The Xpert  Xpress SARS-CoV-2/FLU/RSV plus assay is intended as an aid in the diagnosis of influenza from Nasopharyngeal swab specimens and should not be used as a sole basis for treatment. Nasal washings and aspirates are unacceptable for Xpert Xpress SARS-CoV-2/FLU/RSV testing.  Fact Sheet for Patients: BloggerCourse.com  Fact Sheet for Healthcare Providers: SeriousBroker.ithttps://www.fda.gov/media/152162/download  This test is not yet approved or cleared by the [place] FDA and has been authorized for detection and/or diagnosis of SARS-CoV-2 by FDA under an Emergency Use Authorization (EUA). This EUA will remain in effect (meaning this test can be used) for the duration of the COVID-19 declaration under Section 564(b)(1) of the Act, 21 U.S.C. section 360bbb-3(b)(1), unless the authorization is terminated or revoked.  Performed at [facility], [address] [place], [place] [address]   Culture, blood (Routine x 2)     Status: None   Collection Time: 05/14/20  5:38 PM   Specimen: BLOOD RIGHT HAND  Result Value Ref Range Status   Specimen Description BLOOD RIGHT HAND  Final   Special Requests   Final    BOTTLES DRAWN AEROBIC AND ANAEROBIC Blood Culture results may not be optimal due to an inadequate volume of blood received in culture bottles   Culture   Final    NO GROWTH 5 DAYS Performed at [facility], [address] [place], [place] [address]    Report Status 05/19/2020 FINAL  Final  Culture, blood (Routine x 2)     Status: None   Collection Time: 05/14/20  5:40 PM   Specimen: BLOOD LEFT FOREARM  Result Value Ref Range Status   Specimen Description   Final    BLOOD LEFT FOREARM BOTTLES DRAWN AEROBIC AND ANAEROBIC Blood Culture results may not be optimal due to an inadequate volume of blood received in culture bottles   Special Requests NONE  Final   Culture   Final    NO GROWTH 5 DAYS Performed at [facility], [address] [place], [place] [address]    Report  Status 05/19/2020 FINAL  Final  Urine culture     Status: Abnormal   Collection Time: 05/14/20  5:49 PM   Specimen: In/Out Cath Urine  Result Value Ref Range Status   Specimen Description   Final    IN/OUT CATH URINE Performed at [facility], [address] [place], [place] [address]    Special Requests   Final    NONE Performed at [facility], [address] [place], [place] [address]    Culture (A)  Final    >=100,000 COLONIES/mL ENTEROCOCCUS FAECIUM VANCOMYCIN RESISTANT ENTEROCOCCUS ISOLATED    Report Status 05/17/2020 FINAL  Final   Organism ID, Bacteria ENTEROCOCCUS FAECIUM (A)  Final      Susceptibility   Enterococcus faecium - MIC*    AMPICILLIN >=32 RESISTANT Resistant     NITROFURANTOIN 256 RESISTANT Resistant     VANCOMYCIN >=32 RESISTANT Resistant     LINEZOLID 2 SENSITIVE Sensitive     * >=100,000 COLONIES/mL ENTEROCOCCUS FAECIUM  MRSA PCR Screening     Status: None   Collection Time: 05/15/20 12:08 AM   Specimen: Nasal Mucosa; Nasopharyngeal  Result Value Ref Range Status   MRSA by PCR NEGATIVE NEGATIVE Final    Comment:        The GeneXpert MRSA Assay (FDA approved for NASAL specimens only), is one component of a comprehensive MRSA colonization surveillance program. It is not intended to diagnose MRSA infection nor to guide or monitor treatment for MRSA infections. Performed at [facility], [address] [place], [place] [address]     Recent Labs  Lab 05/14/20 1749  LIPASE 59*   No results for input(s): AMMONIA in the last 168 hours.  Cardiac Enzymes: Recent Labs  Lab 05/14/20 2101  CKTOTAL  49   BNP (last 3 results) Recent Labs    04/26/20 0735 05/14/20 1749  BNP 120.0* 76.0    Starnisha Batrez S Harper [place]   [facility] If 7PM-7AM, please contact night-coverage at [email or web address], Office  [phone]   05/19/2020, 1:08 PM  LOS: 5 days

## 2020-05-19 NOTE — TOC Progression Note (Signed)
Transition of Care [facility]) - Progression Note    Patient Details  Name: Luke Crosby MRN: [id number] Date of Birth: 12/13/1940  Transition of [facility]) CM/SW Contact  Barry Brunner, LCSW Phone Number: 05/19/2020, 2:18 PM  Clinical Narrative:    CSW received notification of family's request for [facility] for home hospice services. CSW placed referral with Cassandra from hospice. Cassandra agreeable to provide home hospice services upon patient's discharge. TOC to follow.    Expected Discharge Plan: Skilled Nursing Facility Barriers to Discharge: Continued Medical Work up  Expected Discharge Plan and Services Expected Discharge Plan: Skilled Nursing Facility In-house Referral: Clinical Social Work     Living arrangements for the past 2 months: Single Family Home                                       Social Determinants of Health (SDOH) Interventions    Readmission Risk Interventions Readmission Risk Prevention Plan 05/15/2020  Transportation Screening Complete  Palliative Care Screening Complete  Medication Review (RN Care Manager) Complete  Some recent data might be hidden

## 2020-05-20 DIAGNOSIS — Z7189 Other specified counseling: Secondary | ICD-10-CM | POA: Diagnosis not present

## 2020-05-20 DIAGNOSIS — E870 Hyperosmolality and hypernatremia: Secondary | ICD-10-CM | POA: Diagnosis not present

## 2020-05-20 DIAGNOSIS — A419 Sepsis, unspecified organism: Secondary | ICD-10-CM | POA: Diagnosis not present

## 2020-05-20 MED ORDER — ACETAMINOPHEN 325 MG PO TABS: 650.0000 mg | ORAL_TABLET | Freq: Four times a day (QID) | ORAL | Status: AC | PRN

## 2020-05-20 NOTE — Discharge Summary (Signed)
Physician Discharge Summary  Luke Crosby BJY:[id number] DOB: [birth year] DOA: 05/14/2020  PCP: Gareth Morgan, MD  Admit date: 05/14/2020 Discharge date: 05/20/2020  Time spent: 50 minutes  Recommendations for Outpatient Follow-up:  1. Patient to be discharged home with home hospice   Discharge Diagnoses:  Principal Problem:    Sepsis due to [facility] UTI Active Problems:   Essential hypertension   ICH (intracerebral hemorrhage) (HCC)   PAF (paroxysmal atrial fibrillation) (HCC)   Severe Hypernatremia   AKI (acute kidney injury) (HCC)   Abnormal LFTs   Lactic acidosis   History of pulmonary embolus (PE)   Elevated troponin   Sepsis (HCC)   Goals of care, counseling/discussion   Palliative care by specialist   Discharge Condition: Stable  Diet recommendation: Regular diet  Filed Weights   05/14/20 1730 05/15/20 0500  Weight: 68 kg 65.9 kg    History of present illness:  79 year old male with history of bilateral pulmonary embolism, right lower extremity DVT in September 2021, atrial fibrillation with sinus node dysfunction, history of pacemaker placement, hypertension, gout who was admitted to [facility] from 04/26/2020 until 05/13/2020 due to intracranial hemorrhage after he had mechanical fall with head injury while anticoagulated on Eliquis for A. fib and DVT/PE.  Anticoagulation was stopped and patient was discharged home with home health services after insurance refused to pay for inpatient rehab and son was not happy going to skilled nursing facility rehab. Patient return to [facility] on 05/14/2020 with worsening mental status and found to be severely dehydrated with sodium of 171 and chloride 130.  He had elevated creatinine of 4.0.    Hospital Course:   1. Severe sepsis-patient presented with altered mental status, hypernatremia, abnormal UA.  Urine culture growing greater than 100,000 colonies per mL Enterococcus faecium, VRE.  Vancomycin was discontinued  and started on Zyvox.  Zyvox has been discontinued as patient is now comfort measures only  2. UTI-initially started on Zyvox which has been discontinued. 3. Acute kidney injury-likely from dehydration, creatinine on admission was 4.0, he has baseline creatinine 0.9 as of 05/03/2020.  Creatinine has improved to 1.12.   4. Hypernatremia-patient presents with sodium 171, started on D5W.  Sodium is now down to 145.  D5W was discontinued. 5. Traumatic left frontal intraparenchymal hemorrhage-small volume SDH, date of injury 04/26/2020.  S/p reversal with Kcentra and vitamin K.  Seen by neurosurgery, no intervention advised.  Repeat CT head on 05/14/2020 shows residual intracranial hemorrhage, no acute findings. 6. Chronic atrial fibrillation with RVR- CHA2DS2VASc score is 3, HAS-BLED score 3, no further anticoagulation due to traumatic ICH.  Continue IV metoprolol 5 mg every 8 hours. 7. Pulmonary embolism with extensive lower extremity DVT-diagnosed on 03/06/2020, Eliquis was discontinued traumatic ICH. 8. Transaminitis-in the setting of hypotension and possible shock liver.  Significantly improved. 9. Metabolic encephalopathy-multifactorial due to sepsis and hypernatremia. 10. Goals of care-discussed with patient's son at bedside.  Patient sodium has improved however his mental status continues to be same.  Patient is not  eating or drinking.  Patient had similar episode at [facility] where he required NG tube feeding.  Patient son is not interested in initiating artificial nutrition at this time.  Discussed with him poor prognosis despite aggressive treatment.  Patient has had multiple hospitalizations over the past few months and has continued to decline medically.  Discussed natural disease trajectory, and end-of-life care.  Patient son is interested in comfort measures only.  He wants to take his father home  with hospice.  Will discontinue antibiotics and IV fluids.  Discontinue labs. Will initiate comfort  measures.  Will discharge patient home with home hospice.     Procedures:    Consultations:    Discharge Exam: Vitals:   05/20/20 0600 05/20/20 0700  BP:    Pulse: (!) 58 66  Resp:    Temp: 98.8 F (37.1 C) 99 F (37.2 C)  SpO2: 100% 100%    General: Appears in no acute distress Cardiovascular: S1-S2, regular Respiratory: Clear to auscultation bilaterally  Discharge Instructions   Discharge Instructions    Diet - low sodium heart healthy   Complete by: As directed    Increase activity slowly   Complete by: As directed      Allergies as of 05/20/2020   No Known Allergies     Medication List    STOP taking these medications   amoxicillin 500 MG capsule Commonly known as: AMOXIL   colchicine 0.6 MG tablet     TAKE these medications   acetaminophen 325 MG tablet Commonly known as: TYLENOL Take 2 tablets (650 mg total) by mouth every 6 (six) hours as needed for mild pain (or Fever >/= 101).      No Known Allergies  Follow-up Information    [facility] Follow up.   Specialty: Hospice Why: Home Hospice  Contact information: [address] [place] [address] [phone]               The results of significant diagnostics from this hospitalization (including imaging, microbiology, ancillary and laboratory) are listed below for reference.    Significant Diagnostic Studies: EEG  Result Date: 04/26/2020 Charlsie Quest, MD     04/26/2020  8:41 PM Patient Name: Luke Crosby MRN: [id number] Epilepsy Attending: Charlsie Quest Referring Physician/Provider: Levon Hedger, PA Date: 04/26/2020 Duration: 25.30 mins Patient history: 79yo M with left frontal ICH. EEG to evaluate for seizue Level of alertness: Awake AEDs during EEG study: None Technical aspects: This EEG study was done with scalp electrodes positioned according to the 10-20 International system of electrode placement. Electrical activity was  acquired at a sampling rate of  and reviewed with a high frequency filter of  and a low frequency filter of . EEG data were recorded continuously and digitally stored. Description: No posterior dominant rhythm was seen. EEG showed continuous generalized and maximal left frontal region 3 to 6 Hz theta-delta slowing. Hyperventilation and photic stimulation were not performed.   ABNORMALITY -Continuous slow, generalized and maximal left frontal region IMPRESSION: This study is suggestive of cortical dysfunction in left frontal region consistent with underlying ICH. There is also moderate diffuse encephalopathy, nonspecific etiology. No seizures or epileptiform discharges were seen throughout the recording. Charlsie Quest   CT ANGIO HEAD W OR WO CONTRAST  Result Date: 04/27/2020 CLINICAL DATA:  Stroke/TIA, determine embolic source; Stroke/TIA, assess intracranial arteries EXAM: CT ANGIOGRAPHY HEAD AND NECK TECHNIQUE: Multidetector CT imaging of the head and neck was performed using the standard protocol during bolus administration of intravenous contrast. Multiplanar CT image reconstructions and MIPs were obtained to evaluate the vascular anatomy. Carotid stenosis measurements (when applicable) are obtained utilizing NASCET criteria, using the distal internal carotid diameter as the denominator. CONTRAST:  75mL OMNIPAQUE IOHEXOL 350 MG/ML SOLN COMPARISON:  04/27/2020 head CT and prior. FINDINGS: CTA NECK FINDINGS Aortic arch: Minimal aortic arch atherosclerotic calcifications. Standard branching. Patent great vessel origins. Right carotid system: Patent. Minimal bifurcation atheromatous disease with  20% proximal ICA luminal narrowing. Left carotid system: Patent. Vertebral arteries: Mild narrowing of the right vertebral artery origin secondary to calcified atheromatous plaque. Tortuous left V1 segment. Patent. Skeleton: No acute or suspicious osseous abnormalities. Multilevel spondylosis. Other neck:  No adenopathy.  No soft tissue mass. Upper chest: Dependent atelectasis.  Trace left pleural effusion. Review of the MIP images confirms the above findings CTA HEAD FINDINGS Anterior circulation: Patent ICAs. Patent ACAs and MCAs. No significant stenosis, proximal occlusion, aneurysm, or vascular malformation. Posterior circulation: Patent PICA. Patent V4 segments and basilar artery. Fetal origin of the right PCA. Bilateral PCAs and proximal superior cerebellar arteries are patent. No significant stenosis, proximal occlusion, aneurysm, or vascular malformation. Venous sinuses: As permitted by contrast timing, patent. Anatomic variants: None. Left frontal intraparenchymal hemorrhage and minimal rightward midline shift, better evaluated on same day head CT. Review of the MIP images confirms the above findings IMPRESSION: No large vessel occlusion, high-grade narrowing, dissection or aneurysm. 20% proximal right ICA narrowing secondary to atheromatous disease. Mild right vertebral artery origin narrowing. Redemonstration of left frontal intraparenchymal hemorrhage, grossly unchanged. Electronically Signed   By: Stana Buntinghikanele  Emekauwa M.D.   On: 04/27/2020 16:40   DG Chest 1 View  Result Date: 04/26/2020 CLINICAL DATA:  Altered mental status. EXAM: CHEST  1 VIEW COMPARISON:  03/20/2020. FINDINGS: The heart size and mediastinal contours are stable. Left subclavian approach cardiac rhythm maintenance device. Low lung volumes without focal consolidation. No visible pleural effusions or pneumothorax. No acute osseous abnormality. IMPRESSION: Low lung volumes without evidence of acute cardiopulmonary disease. Electronically Signed   By: Feliberto HartsFrederick S Jones MD   On: 04/26/2020 08:32   CT Head Wo Contrast  Result Date: 05/14/2020 CLINICAL DATA:  Intracranial hemorrhage, follow-up EXAM: CT HEAD WITHOUT CONTRAST TECHNIQUE: Contiguous axial images were obtained from the base of the skull through the vertex without intravenous  contrast. COMPARISON:  05/08/2020 FINDINGS: Brain: Continued evolution of left frontal parenchymal hemorrhage with further decreased size and density. Small volume residual hemorrhage layering in the occipital horns. Stable findings of chronic microvascular ischemic changes and parenchymal volume loss. Chronic right parasagittal frontal infarct. No new hemorrhage or loss of gray-white differentiation. Vascular: There is atherosclerotic calcification at the skull base. Skull: Calvarium is unremarkable. Sinuses/Orbits: No acute finding. Other: None. IMPRESSION: Expected evolution of left frontal parenchymal hemorrhage. Small volume residual intraventricular hemorrhage. No acute findings. Electronically Signed   By: Guadlupe SpanishPraneil  Patel M.D.   On: 05/14/2020 20:46   CT HEAD WO CONTRAST  Result Date: 05/08/2020 CLINICAL DATA:  Mental status changes of unknown cause. Previous intracranial hemorrhage. EXAM: CT HEAD WITHOUT CONTRAST TECHNIQUE: Contiguous axial images were obtained from the base of the skull through the vertex without intravenous contrast. COMPARISON:  04/30/2020 FINDINGS: Brain: Background pattern of chronic small vessel ischemic changes throughout the pons, thalami, basal ganglia and cerebral hemispheric white matter. Subacute intraparenchymal hematoma in the left frontal lobe is becoming less dense and contracting. No sign of any new or worsening hemorrhage in that region. Blood layering dependently in the lateral ventricles is becoming less dense and resolving. No sign of developing hydrocephalus. No extra-axial collection. Vascular: There is atherosclerotic calcification of the major vessels at the base of the brain. Skull: Negative Sinuses/Orbits: Clear/normal Other: None IMPRESSION: 1. No new or worsening finding. Subacute intraparenchymal hematoma in the left frontal lobe is becoming less dense and contracting. No sign of any new or worsening hemorrhage in that region. Blood layering dependently in  the lateral ventricles is  becoming less dense and resolving. No sign of developing hydrocephalus. 2. Background pattern of chronic small vessel ischemic changes throughout the brain as outlined above. Electronically Signed   By: Paulina Fusi M.D.   On: 05/08/2020 13:22   CT HEAD WO CONTRAST  Result Date: 04/30/2020 CLINICAL DATA:  Intracranial hemorrhage. Follow-up ventricular size. EXAM: CT HEAD WITHOUT CONTRAST TECHNIQUE: Contiguous axial images were obtained from the base of the skull through the vertex without intravenous contrast. COMPARISON:  CT head 04/27/2020 FINDINGS: Brain: Moderate to large hematoma left frontal lobe unchanged in size. There is mass-effect on the left frontal horn. There is intraventricular hemorrhage in the occipital pole bilaterally without progression. Mild improvement in ventricular blood on the left. Negative for hydrocephalus. 4 mm midline shift to the right unchanged. Extensive diffuse white matter hypodensity throughout both cerebral hemispheres most compatible with chronic microvascular ischemia. Vascular: Negative for hyperdense vessel Skull: Negative Sinuses/Orbits: Mild mucosal edema paranasal sinuses. NG tube in place. Negative orbit Other: None IMPRESSION: Moderate to large left frontal hematoma unchanged in size. Intraventricular hemorrhage slightly improved. No hydrocephalus. 4 mm midline shift to the right is unchanged. Electronically Signed   By: Marlan Palau M.D.   On: 04/30/2020 13:21   CT HEAD WO CONTRAST  Result Date: 04/27/2020 CLINICAL DATA:  Parenchymal hemorrhage, follow-up left frontal ICH. EXAM: CT HEAD WITHOUT CONTRAST TECHNIQUE: Contiguous axial images were obtained from the base of the skull through the vertex without intravenous contrast. COMPARISON:  Head CT 04/26/2020. FINDINGS: Brain: Again demonstrated is multifocal intraparenchymal hemorrhage within the left frontal lobe. The hemorrhages appear more confluent and more numerous. In  conglomerate, the left frontal parenchymal hemorrhage now measure 6.6 x 2.9 x 4.8 cm (previously 6.6 x 2.9 x 4.7 cm, remeasured on prior). As before, there is partial effacement of the left lateral ventricle with 4 mm rightward midline shift. New from the prior examination, there is small volume intraventricular hemorrhage within the left greater than right occipital horns. No evidence of hydrocephalus. Unchanged scattered small volume subarachnoid hemorrhage, most notably along the anterior frontal lobes and right occipital lobe. Stable background generalized cerebral atrophy and chronic small vessel ischemic disease. No demarcated cortical infarct is identified. Vascular: No hyperdense vessel.  Atherosclerotic calcifications. Skull: Normal. Negative for fracture or focal lesion. Sinuses/Orbits: Visualized orbits show no acute finding. Mild ethmoid sinus mucosal thickening. No significant mastoid effusion. These results will be called to the ordering clinician or representative by the Radiologist Assistant, and communication documented in the PACS or [employer]. IMPRESSION: Left frontal lobe intraparenchymal hemorrhages appear more confluent and more numerous as compared to the exam of 04/26/2020. However, in conglomerate the hemorrhages have not significantly changed in size encompassing an area measuring 6.6 x 2.9 x 4.8 cm. Unchanged mass effect with partial effacement of the left lateral ventricle and 4 mm rightward midline shift. Small volume intraventricular hemorrhage within the left greater than right occipital horns, new from the prior examination. No evidence of hydrocephalus. Unchanged scattered small volume subarachnoid hemorrhage along the bilateral cerebral hemispheres, most notably along the anterior frontal lobes and right occipital lobe. Electronically Signed   By: Jackey Loge DO   On: 04/27/2020 07:49   CT HEAD WO CONTRAST  Result Date: 04/26/2020 CLINICAL DATA:  Altered mental status  EXAM: CT HEAD WITHOUT CONTRAST TECHNIQUE: Contiguous axial images were obtained from the base of the skull through the vertex without intravenous contrast. COMPARISON:  05/29/2015 head CT and prior. FINDINGS: Brain: New focus of left  frontal intraparenchymal hemorrhage measuring 5.8 x 2.9 cm (3:19) with peripheral edema. There is tracking of blood products along the anterior falx and bifrontal sulci. Rightward midline shift of 4 mm. Multiple discrete hyperdensities are seen within the left frontal lobe the largest of which measures 2.5 cm and demonstrates an internal fluid fluid level (3:20). Small volume subarachnoid hemorrhage also overlies the occipital convexities. No intraventricular extension of hemorrhage. No ventriculomegaly. Mild cerebral atrophy with ex vacuo dilatation. Chronic microvascular ischemic changes. Vascular: No hyperdense vessel. Bilateral carotid siphon atherosclerotic calcifications. Skull: No acute fracture or focal lesion. Sinuses/Orbits: Normal orbits. Clear paranasal sinuses and mastoid air cells. Other: None. IMPRESSION: 5.8 cm left frontal intraparenchymal hemorrhage with 4 mm rightward midline shift. Multifocal left frontal hyperdensities some of which demonstrate internal fluid-fluid levels are suspicious for septic emboli. Consider MRI head with and without contrast for further evaluation. Small volume subdural and subarachnoid hemorrhage. These results were called by telephone at the time of interpretation on 04/26/2020 at 9:27 am to provider [facility] , who verbally acknowledged these results. Electronically Signed   By: Stana Bunting M.D.   On: 04/26/2020 09:39   CT ANGIO NECK W OR WO CONTRAST  Result Date: 04/27/2020 CLINICAL DATA:  Stroke/TIA, determine embolic source; Stroke/TIA, assess intracranial arteries EXAM: CT ANGIOGRAPHY HEAD AND NECK TECHNIQUE: Multidetector CT imaging of the head and neck was performed using the standard protocol during bolus  administration of intravenous contrast. Multiplanar CT image reconstructions and MIPs were obtained to evaluate the vascular anatomy. Carotid stenosis measurements (when applicable) are obtained utilizing NASCET criteria, using the distal internal carotid diameter as the denominator. CONTRAST:  65mL OMNIPAQUE IOHEXOL 350 MG/ML SOLN COMPARISON:  04/27/2020 head CT and prior. FINDINGS: CTA NECK FINDINGS Aortic arch: Minimal aortic arch atherosclerotic calcifications. Standard branching. Patent great vessel origins. Right carotid system: Patent. Minimal bifurcation atheromatous disease with 20% proximal ICA luminal narrowing. Left carotid system: Patent. Vertebral arteries: Mild narrowing of the right vertebral artery origin secondary to calcified atheromatous plaque. Tortuous left V1 segment. Patent. Skeleton: No acute or suspicious osseous abnormalities. Multilevel spondylosis. Other neck: No adenopathy.  No soft tissue mass. Upper chest: Dependent atelectasis.  Trace left pleural effusion. Review of the MIP images confirms the above findings CTA HEAD FINDINGS Anterior circulation: Patent ICAs. Patent ACAs and MCAs. No significant stenosis, proximal occlusion, aneurysm, or vascular malformation. Posterior circulation: Patent PICA. Patent V4 segments and basilar artery. Fetal origin of the right PCA. Bilateral PCAs and proximal superior cerebellar arteries are patent. No significant stenosis, proximal occlusion, aneurysm, or vascular malformation. Venous sinuses: As permitted by contrast timing, patent. Anatomic variants: None. Left frontal intraparenchymal hemorrhage and minimal rightward midline shift, better evaluated on same day head CT. Review of the MIP images confirms the above findings IMPRESSION: No large vessel occlusion, high-grade narrowing, dissection or aneurysm. 20% proximal right ICA narrowing secondary to atheromatous disease. Mild right vertebral artery origin narrowing. Redemonstration of left  frontal intraparenchymal hemorrhage, grossly unchanged. Electronically Signed   By: Stana Bunting M.D.   On: 04/27/2020 16:40   DG Chest Port 1 View  Result Date: 05/14/2020 CLINICAL DATA:  Change in mental status EXAM: PORTABLE CHEST 1 VIEW COMPARISON:  April 26, 2020 FINDINGS: The heart size and mediastinal contours are within normal limits. There is prominence of the central pulmonary vasculature. No large airspace consolidation or pleural effusion. A left-sided pacemaker seen with the lead tips in the right atrium and right ventricle. The visualized skeletal structures are unremarkable. IMPRESSION:  Mild pulmonary vascular congestion. Electronically Signed   By: Jonna Clark M.D.   On: 05/14/2020 18:11   DG Swallowing Func-Speech Pathology  Result Date: 05/10/2020 Objective Swallowing Evaluation: Type of Study: MBS-Modified Barium Swallow Study  Patient Details Name: SABIAN KUBA MRN: [id number] Date of Birth: Apr 11, 1941 Today's Date: 05/10/2020 Time: SLP Start Time (ACUTE ONLY): 1155 -SLP Stop Time (ACUTE ONLY): 1210 SLP Time Calculation (min) (ACUTE ONLY): 15 min Past Medical History: Past Medical History: Diagnosis Date . Altered mental status 05/29/2015 . DVT (deep vein thrombosis) in pregnancy  . Hypertension  . Pulmonary embolism (HCC)  . Sinus node dysfunction (HCC)   With pacemaker Past Surgical History: Past Surgical History: Procedure Laterality Date . INSERT / REPLACE / REMOVE PACEMAKER  ~ 2009  [employer] Versa [id number] . LAPAROSCOPIC CHOLECYSTECTOMY   HPI: Pt is a 79 year old male with hx of PE and DVT/Afib on Eliquis who presented with progressive to [facility] for confusion and was found to have left frontal intraparenchymal hemorrhage s/p reversal with Kcentra and vitamin K. Pt had recent E. Coli UTI and hospitalization  9/27- 10/1 for gout and cellulitis. Since, he has had a progressive decline, per H&P, pt had not been eating well and stopped speaking on date of admission.  He  was transferred to [facility] for further evaluation. EEG showed no electrographic seizures. CT head 11/4: Left frontal lobe intraparenchymal hemorrhages appear more confluent and more numerous as compared to the exam of 04/26/2020  Subjective: alert but not following commands Assessment / Plan / Recommendation CHL IP CLINICAL IMPRESSIONS 05/10/2020 Clinical Impression Pt was seen for MBS which revealed a mild oropharyngeal dysphagia characterized by delayed oral transit, premature spillage, and pharyngeal residue. Pt was observed to intermitently demonstrate delayed oral transit, primarily with thin and nectar thick liquids. He also demonstrated premature spillage of liquids that fell to the level of the pyriforms before the swallow was initiated. No instances of penetration or aspiration were observed across POs. Mild pharyngeal residue of puree was observed in the valleculae after the swallow. Recommend pt start dys 1 diet with thin liquids. Crush meds. Pt will require assist with feeding. SLP will f/u acutely for diet toleration.  SLP Visit Diagnosis Dysphagia, oropharyngeal phase (R13.12) Attention and concentration deficit following -- Frontal lobe and executive function deficit following -- Impact on safety and function Mild aspiration risk   CHL IP TREATMENT RECOMMENDATION 05/10/2020 Treatment Recommendations Therapy as outlined in treatment plan below   Prognosis 05/10/2020 Prognosis for Safe Diet Advancement Fair Barriers to Reach Goals Cognitive deficits;Severity of deficits Barriers/Prognosis Comment -- CHL IP DIET RECOMMENDATION 05/10/2020 SLP Diet Recommendations Dysphagia 1 (Puree) solids;Thin liquid Liquid Administration via Straw;Cup Medication Administration Crushed with puree Compensations -- Postural Changes Seated upright at 90 degrees   CHL IP OTHER RECOMMENDATIONS 05/10/2020 Recommended Consults -- Oral Care Recommendations Oral care BID Other Recommendations --   CHL IP FOLLOW UP  RECOMMENDATIONS 05/10/2020 Follow up Recommendations Inpatient Rehab   CHL IP FREQUENCY AND DURATION 05/10/2020 Speech Therapy Frequency (ACUTE ONLY) min 2x/week Treatment Duration 2 weeks      CHL IP ORAL PHASE 05/10/2020 Oral Phase Impaired Oral - Pudding Teaspoon -- Oral - Pudding Cup -- Oral - Honey Teaspoon -- Oral - Honey Cup -- Oral - Nectar Teaspoon -- Oral - Nectar Cup -- Oral - Nectar Straw Delayed oral transit;Premature spillage Oral - Thin Teaspoon -- Oral - Thin Cup -- Oral - Thin Straw Delayed oral transit;Premature spillage Oral -  Puree Delayed oral transit Oral - Mech Soft -- Oral - Regular -- Oral - Multi-Consistency -- Oral - Pill -- Oral Phase - Comment --  CHL IP PHARYNGEAL PHASE 05/10/2020 Pharyngeal Phase Impaired Pharyngeal- Pudding Teaspoon -- Pharyngeal -- Pharyngeal- Pudding Cup -- Pharyngeal -- Pharyngeal- Honey Teaspoon -- Pharyngeal -- Pharyngeal- Honey Cup -- Pharyngeal -- Pharyngeal- Nectar Teaspoon -- Pharyngeal -- Pharyngeal- Nectar Cup -- Pharyngeal -- Pharyngeal- Nectar Straw Pharyngeal residue - valleculae Pharyngeal -- Pharyngeal- Thin Teaspoon -- Pharyngeal -- Pharyngeal- Thin Cup -- Pharyngeal -- Pharyngeal- Thin Straw Pharyngeal residue - valleculae Pharyngeal -- Pharyngeal- Puree Pharyngeal residue - valleculae Pharyngeal -- Pharyngeal- Mechanical Soft -- Pharyngeal -- Pharyngeal- Regular -- Pharyngeal -- Pharyngeal- Multi-consistency -- Pharyngeal -- Pharyngeal- Pill -- Pharyngeal -- Pharyngeal Comment --  No flowsheet data found. Harlon Ditty, MA CCC-SLP [facility] Pager [phone] Office [phone] Claudine Mouton 05/10/2020, 1:54 PM              ECHOCARDIOGRAM COMPLETE BUBBLE STUDY  Result Date: 04/26/2020    ECHOCARDIOGRAM REPORT   Patient Name:   Luke Crosby Date of Exam: 04/26/2020 Medical Rec #:  [id number]        Height:       66.0 in Accession #:    [id number]       Weight:       175.0 lb Date of Birth:  Jan 12, 1941       BSA:           1.890 m Patient Age:    78 years         BP:           129/96 mmHg Patient Gender: M                HR:           109 bpm. Exam Location:  Inpatient Procedure: 2D Echo, Intracardiac Opacification Agent and Saline Contrast Bubble            Study Indications:    Stroke 434.91 / I63.9  History:        Patient has prior history of Echocardiogram examinations, most                 recent 03/07/2020. Pacemaker, Arrythmias:Atrial Fibrillation and                 Sinus dysfunction; Risk Factors:Hypertension. History of DVT and                 PE.  Sonographer:    Leta Jungling RDCS Referring Phys: [phone] PAULA B SIMPSON IMPRESSIONS  1. Left ventricular ejection fraction, by estimation, is 55%. The left ventricle has low normal function. The left ventricle has no regional wall motion abnormalities. There is mild asymmetric left ventricular hypertrophy of the septal segment. Left ventricular diastolic parameters are indeterminate.  2. Right ventricular systolic function is mildly reduced. The right ventricular size is mildly enlarged. There is moderately elevated pulmonary artery systolic pressure. The estimated right ventricular systolic pressure is 47.3 mmHg.  3. Left atrial size was mildly dilated.  4. The mitral valve is normal in structure. Trivial mitral valve regurgitation. No evidence of mitral stenosis.  5. Tricuspid valve regurgitation is mild to moderate.  6. The aortic valve is grossly normal. There is mild calcification of the aortic valve. Aortic valve regurgitation is mild. No aortic stenosis is present.  7. Aortic dilatation noted. There is mild dilatation of the ascending aorta, measuring 42  mm.  8. The inferior vena cava is dilated in size with <50% respiratory variability, suggesting right atrial pressure of 15 mmHg. Conclusion(s)/Recommendation(s): No intracardiac source of embolism detected on this transthoracic study. A transesophageal echocardiogram is recommended to exclude cardiac source of  embolism if clinically indicated. FINDINGS  Left Ventricle: Left ventricular ejection fraction, by estimation, is 55%. The left ventricle has low normal function. The left ventricle has no regional wall motion abnormalities. Definity contrast agent was given IV to delineate the left ventricular endocardial borders. The left ventricular internal cavity size was normal in size. There is mild asymmetric left ventricular hypertrophy of the septal segment. Left ventricular diastolic parameters are indeterminate. Right Ventricle: The right ventricular size is mildly enlarged. No increase in right ventricular wall thickness. Right ventricular systolic function is mildly reduced. There is moderately elevated pulmonary artery systolic pressure. The tricuspid regurgitant velocity is 2.84 m/s, and with an assumed right atrial pressure of 15 mmHg, the estimated right ventricular systolic pressure is 47.3 mmHg. Left Atrium: Left atrial size was mildly dilated. Right Atrium: Right atrial size was normal in size. Pericardium: There is no evidence of pericardial effusion. Mitral Valve: The mitral valve is normal in structure. Trivial mitral valve regurgitation. No evidence of mitral valve stenosis. Tricuspid Valve: The tricuspid valve is normal in structure. Tricuspid valve regurgitation is mild to moderate. No evidence of tricuspid stenosis. Aortic Valve: The aortic valve is grossly normal. There is mild calcification of the aortic valve. Aortic valve regurgitation is mild. Aortic regurgitation PHT measures 386 msec. No aortic stenosis is present. Pulmonic Valve: The pulmonic valve was not well visualized. Pulmonic valve regurgitation is mild. No evidence of pulmonic stenosis. Aorta: Aortic dilatation noted. There is mild dilatation of the ascending aorta, measuring 42 mm. Venous: The inferior vena cava is dilated in size with less than 50% respiratory variability, suggesting right atrial pressure of 15 mmHg. IAS/Shunts: No atrial  level shunt detected by color flow Doppler. Agitated saline contrast was given intravenously to evaluate for intracardiac shunting. EKG: Rhythm strip during this exam demostrated atrial fibrillation. Additional Comments: A pacer wire is visualized in the right atrium and right ventricle.  LEFT VENTRICLE PLAX 2D LVIDd:         4.26 cm      Diastology LVIDs:         3.76 cm      LV e' medial:    7.51 cm/s LV PW:         0.67 cm      LV E/e' medial:  12.9 LV IVS:        1.07 cm      LV e' lateral:   12.53 cm/s LVOT diam:     2.30 cm      LV E/e' lateral: 7.8 LV SV:         50 LV SV Index:   26 LVOT Area:     4.15 cm  LV Volumes (MOD) LV vol d, MOD A2C: 112.0 ml LV vol d, MOD A4C: 109.0 ml LV vol s, MOD A2C: 53.4 ml LV vol s, MOD A4C: 64.0 ml LV SV MOD A2C:     58.6 ml LV SV MOD A4C:     109.0 ml LV SV MOD BP:      51.8 ml RIGHT VENTRICLE RV S prime:     8.81 cm/s TAPSE (M-mode): 1.6 cm LEFT ATRIUM             Index  RIGHT ATRIUM           Index LA diam:        3.00 cm 1.59 cm/m  RA Area:     14.60 cm LA Vol (A2C):   68.4 ml 36.20 ml/m RA Volume:   34.60 ml  18.31 ml/m LA Vol (A4C):   64.6 ml 34.19 ml/m LA Biplane Vol: 67.1 ml 35.51 ml/m  AORTIC VALVE LVOT Vmax:   69.85 cm/s LVOT Vmean:  49.000 cm/s LVOT VTI:    0.120 m AI PHT:      386 msec  AORTA Ao Root diam: 3.70 cm Ao Asc diam:  3.70 cm MITRAL VALVE               TRICUSPID VALVE MV Area (PHT): 4.65 cm    TR Peak grad:   32.3 mmHg MV Decel Time: 163 msec    TR Vmax:        284.00 cm/s MV E velocity: 97.13 cm/s                            SHUNTS                            Systemic VTI:  0.12 m                            Systemic Diam: 2.30 cm Weston Brass MD Electronically signed by Weston Brass MD Signature Date/Time: 04/26/2020/11:08:03 PM    Final    VAS [place] LOWER EXTREMITY VENOUS (DVT)  Result Date: 04/26/2020  Lower Venous DVT Study Other Indications: Follow up dvt. Comparison Study: 03/07/20 previous Performing Technologist: Blanch Media RVS   Examination Guidelines: A complete evaluation includes B-mode imaging, spectral Doppler, color Doppler, and power Doppler as needed of all accessible portions of each vessel. Bilateral testing is considered an integral part of a complete examination. Limited examinations for reoccurring indications may be performed as noted. The reflux portion of the exam is performed with the patient in reverse Trendelenburg.  +---------+---------------+---------+-----------+----------+--------------+ RIGHT    CompressibilityPhasicitySpontaneityPropertiesThrombus Aging +---------+---------------+---------+-----------+----------+--------------+ CFV      Full           Yes      Yes                                 +---------+---------------+---------+-----------+----------+--------------+ SFJ      Full                                                        +---------+---------------+---------+-----------+----------+--------------+ FV Prox  Full                                                        +---------+---------------+---------+-----------+----------+--------------+ FV Mid   Full                                                        +---------+---------------+---------+-----------+----------+--------------+  FV DistalFull                                                        +---------+---------------+---------+-----------+----------+--------------+ PFV      Full                                                        +---------+---------------+---------+-----------+----------+--------------+ POP      Full           Yes      Yes                                 +---------+---------------+---------+-----------+----------+--------------+ PTV      Full                                                        +---------+---------------+---------+-----------+----------+--------------+ PERO     Full                                                         +---------+---------------+---------+-----------+----------+--------------+   +---------+---------------+---------+-----------+----------+--------------+ LEFT     CompressibilityPhasicitySpontaneityPropertiesThrombus Aging +---------+---------------+---------+-----------+----------+--------------+ CFV      Full           Yes      Yes                                 +---------+---------------+---------+-----------+----------+--------------+ SFJ      Full                                                        +---------+---------------+---------+-----------+----------+--------------+ FV Prox  Full                                                        +---------+---------------+---------+-----------+----------+--------------+ FV Mid   Full                                                        +---------+---------------+---------+-----------+----------+--------------+ FV DistalFull                                                        +---------+---------------+---------+-----------+----------+--------------+  PFV      Full                                                        +---------+---------------+---------+-----------+----------+--------------+ POP      Full           Yes      Yes                                 +---------+---------------+---------+-----------+----------+--------------+ PTV      Full                                                        +---------+---------------+---------+-----------+----------+--------------+ PERO     Full                                                        +---------+---------------+---------+-----------+----------+--------------+     Summary: BILATERAL: - No evidence of deep vein thrombosis seen in the lower extremities, bilaterally. - No evidence of superficial venous thrombosis in the lower extremities, bilaterally. -No evidence of popliteal cyst, bilaterally.   *See table(s) above for measurements  and observations. Electronically signed by Gretta Began MD on 04/26/2020 at 3:04:19 PM.    Final     Microbiology: Recent Results (from the past 240 hour(s))  Resp Panel by RT-PCR (Flu A&B, Covid) Nasopharyngeal Swab     Status: None   Collection Time: 05/14/20  5:37 PM   Specimen: Nasopharyngeal Swab; Nasopharyngeal(NP) swabs in vial transport medium  Result Value Ref Range Status   SARS Coronavirus 2 by RT PCR NEGATIVE NEGATIVE Final    Comment: (NOTE) SARS-CoV-2 target nucleic acids are NOT DETECTED.  The SARS-CoV-2 RNA is generally detectable in upper respiratory specimens during the acute phase of infection. The lowest concentration of SARS-CoV-2 viral copies this assay can detect is 138 copies/mL. A negative result does not preclude SARS-Cov-2 infection and should not be used as the sole basis for treatment or other patient management decisions. A negative result may occur with  improper specimen collection/handling, submission of specimen other than nasopharyngeal swab, presence of viral mutation(s) within the areas targeted by this assay, and inadequate number of viral copies(<138 copies/mL). A negative result must be combined with clinical observations, patient history, and epidemiological information. The expected result is Negative.  Fact Sheet for Patients:  BloggerCourse.com  Fact Sheet for Healthcare Providers:  SeriousBroker.it  This test is no t yet approved or cleared by the [place] FDA and  has been authorized for detection and/or diagnosis of SARS-CoV-2 by FDA under an Emergency Use Authorization (EUA). This EUA will remain  in effect (meaning this test can be used) for the duration of the COVID-19 declaration under Section 564(b)(1) of the Act, 21 U.S.C.section 360bbb-3(b)(1), unless the authorization is terminated  or revoked sooner.       Influenza A by PCR NEGATIVE NEGATIVE Final   Influenza B by PCR  NEGATIVE  NEGATIVE Final    Comment: (NOTE) The Xpert Xpress SARS-CoV-2/FLU/RSV plus assay is intended as an aid in the diagnosis of influenza from Nasopharyngeal swab specimens and should not be used as a sole basis for treatment. Nasal washings and aspirates are unacceptable for Xpert Xpress SARS-CoV-2/FLU/RSV testing.  Fact Sheet for Patients: BloggerCourse.com  Fact Sheet for Healthcare Providers: SeriousBroker.it  This test is not yet approved or cleared by the [place] FDA and has been authorized for detection and/or diagnosis of SARS-CoV-2 by FDA under an Emergency Use Authorization (EUA). This EUA will remain in effect (meaning this test can be used) for the duration of the COVID-19 declaration under Section 564(b)(1) of the Act, 21 U.S.C. section 360bbb-3(b)(1), unless the authorization is terminated or revoked.  Performed at [facility], [address], [place], [place] [address]   Culture, blood (Routine x 2)     Status: None   Collection Time: 05/14/20  5:38 PM   Specimen: BLOOD RIGHT HAND  Result Value Ref Range Status   Specimen Description BLOOD RIGHT HAND  Final   Special Requests   Final    BOTTLES DRAWN AEROBIC AND ANAEROBIC Blood Culture results may not be optimal due to an inadequate volume of blood received in culture bottles   Culture   Final    NO GROWTH 5 DAYS Performed at [facility], [address] [place], [place] [address]    Report Status 05/19/2020 FINAL  Final  Culture, blood (Routine x 2)     Status: None   Collection Time: 05/14/20  5:40 PM   Specimen: BLOOD LEFT FOREARM  Result Value Ref Range Status   Specimen Description   Final    BLOOD LEFT FOREARM BOTTLES DRAWN AEROBIC AND ANAEROBIC Blood Culture results may not be optimal due to an inadequate volume of blood received in culture bottles   Special Requests NONE  Final   Culture   Final    NO GROWTH 5 DAYS Performed at [facility], [address], [place], [place] [address]    Report Status 05/19/2020 FINAL  Final  Urine culture     Status: Abnormal   Collection Time: 05/14/20  5:49 PM   Specimen: In/Out Cath Urine  Result Value Ref Range Status   Specimen Description   Final    IN/OUT CATH URINE Performed at [facility], [address] [place], [place] [address]    Special Requests   Final    NONE Performed at [facility], [address], [place], [place] [address]    Culture (A)  Final    >=100,000 COLONIES/mL ENTEROCOCCUS FAECIUM VANCOMYCIN RESISTANT ENTEROCOCCUS ISOLATED    Report Status 05/17/2020 FINAL  Final   Organism ID, Bacteria ENTEROCOCCUS FAECIUM (A)  Final      Susceptibility   Enterococcus faecium - MIC*    AMPICILLIN >=32 RESISTANT Resistant     NITROFURANTOIN 256 RESISTANT Resistant     VANCOMYCIN >=32 RESISTANT Resistant     LINEZOLID 2 SENSITIVE Sensitive     * >=100,000 COLONIES/mL ENTEROCOCCUS FAECIUM  MRSA PCR Screening     Status: None   Collection Time: 05/15/20 12:08 AM   Specimen: Nasal Mucosa; Nasopharyngeal  Result Value Ref Range Status   MRSA by PCR NEGATIVE NEGATIVE Final    Comment:        The GeneXpert MRSA Assay (FDA approved for NASAL specimens only), is one component of a comprehensive MRSA colonization surveillance program. It is not intended to diagnose MRSA infection  nor to guide or monitor treatment for MRSA infections. Performed at [facility], [address] [place], [place] [address]      Labs: Basic Metabolic Panel: Recent Labs  Lab 05/17/20 1800 05/17/20 2359 05/18/20 0749 05/18/20 1200 05/19/20 0644  NA 155* 153* 149* 150* 145  K 3.6 3.5 3.5 3.6 3.5  CL 123* 121* 117* 117* 113*  CO2 25 25 27 26 23   GLUCOSE 142* 143* 147* 133* 162*  BUN 49* 43* 41* 39* 31*  CREATININE 1.22 1.16 1.15 1.12 0.94  CALCIUM 8.9 8.8* 8.8* 9.0 8.6*   Liver Function Tests: Recent Labs  Lab 05/14/20 1733 05/15/20 0420 05/18/20 0749 05/19/20 0644  AST  64* 44* 55* 45*  ALT 116* 81* 87* 77*  ALKPHOS 112 78 87 89  BILITOT 1.8* 1.3* 1.7* 1.7*  PROT 8.0 5.8* 5.7* 5.4*  ALBUMIN 3.6 2.7* 2.5* 2.3*   Recent Labs  Lab 05/14/20 1749  LIPASE 59*   No results for input(s): AMMONIA in the last 168 hours. CBC: Recent Labs  Lab 05/14/20 1733 05/15/20 0420 05/17/20 0355 05/18/20 0749  WBC 13.6* 13.7* 11.6* 11.3*  NEUTROABS 11.3*  --   --   --   HGB 17.5* 13.8 12.6* 12.6*  HCT 59.2* 48.8 43.4 42.7  MCV 102.2* 104.9* 102.6* 100.5*  PLT 269 157 81* 70*   Cardiac Enzymes: Recent Labs  Lab 05/14/20 2101  CKTOTAL 49   BNP: BNP (last 3 results) Recent Labs    04/26/20 0735 05/14/20 1749  BNP 120.0* 76.0    ProBNP (last 3 results) No results for input(s): PROBNP in the last 8760 hours.  CBG: Recent Labs  Lab 05/18/20 2039 05/18/20 2331 05/19/20 0509 05/19/20 0724 05/19/20 1112  GLUCAP 117* 119* 126* 138* 120*       Signed:  Meredeth Ide MD.  [facility] 05/20/2020, 11:21 AM

## 2020-05-20 NOTE — Progress Notes (Signed)
Patient picked up by EMS for discharge to home with son. Son is aware of discharge. Paperwork sent with patient. No belongings were at bedside other than two pillows which were sent with patient home. Son had collected other personal items at an earlier time. Patient to be sent home with hospice. Hospice is aware and has spoken with family.

## 2020-05-20 NOTE — TOC Transition Note (Signed)
Transition of Care [facility]) - CM/SW Discharge Note   Patient Details  Name: Luke Crosby MRN: [id number] Date of Birth: 05/02/1941  Transition of Care [facility]) CM/SW Contact:  Barry Brunner, LCSW Phone Number: 05/20/2020, 11:02 AM   Clinical Narrative:    CSW notified of patient's discharge. CSW contacted [facility] to notify them of patient's discharge. Hospice Nurse agreeable to provide services. TOC signing off.    Final next level of care: Home w Hospice Care Barriers to Discharge: Barriers Resolved   Patient Goals and CMS Choice Patient states their goals for this hospitalization and ongoing recovery are:: Return home with home hospice CMS Medicare.gov Compare Post Acute Care list provided to:: Patient Choice offered to / list presented to : Patient, Adult Children  Discharge Placement                    Patient and family notified of of transfer: 05/20/20  Discharge Plan and Services In-house Referral: Clinical Social Work                                   Social Determinants of Health (SDOH) Interventions     Readmission Risk Interventions Readmission Risk Prevention Plan 05/15/2020  Transportation Screening Complete  Palliative Care Screening Complete  Medication Review Oceanographer) Complete  Some recent data might be hidden

## 2020-06-24 DEATH — deceased

## 2022-08-11 IMAGING — DX DG CHEST 2V
2 series · 2 of 2 positions shown · non-contrast
Comparison: May 29, 2015.

CLINICAL DATA: Leg swelling.

EXAM:
CHEST - 2 VIEW

[chest lat]
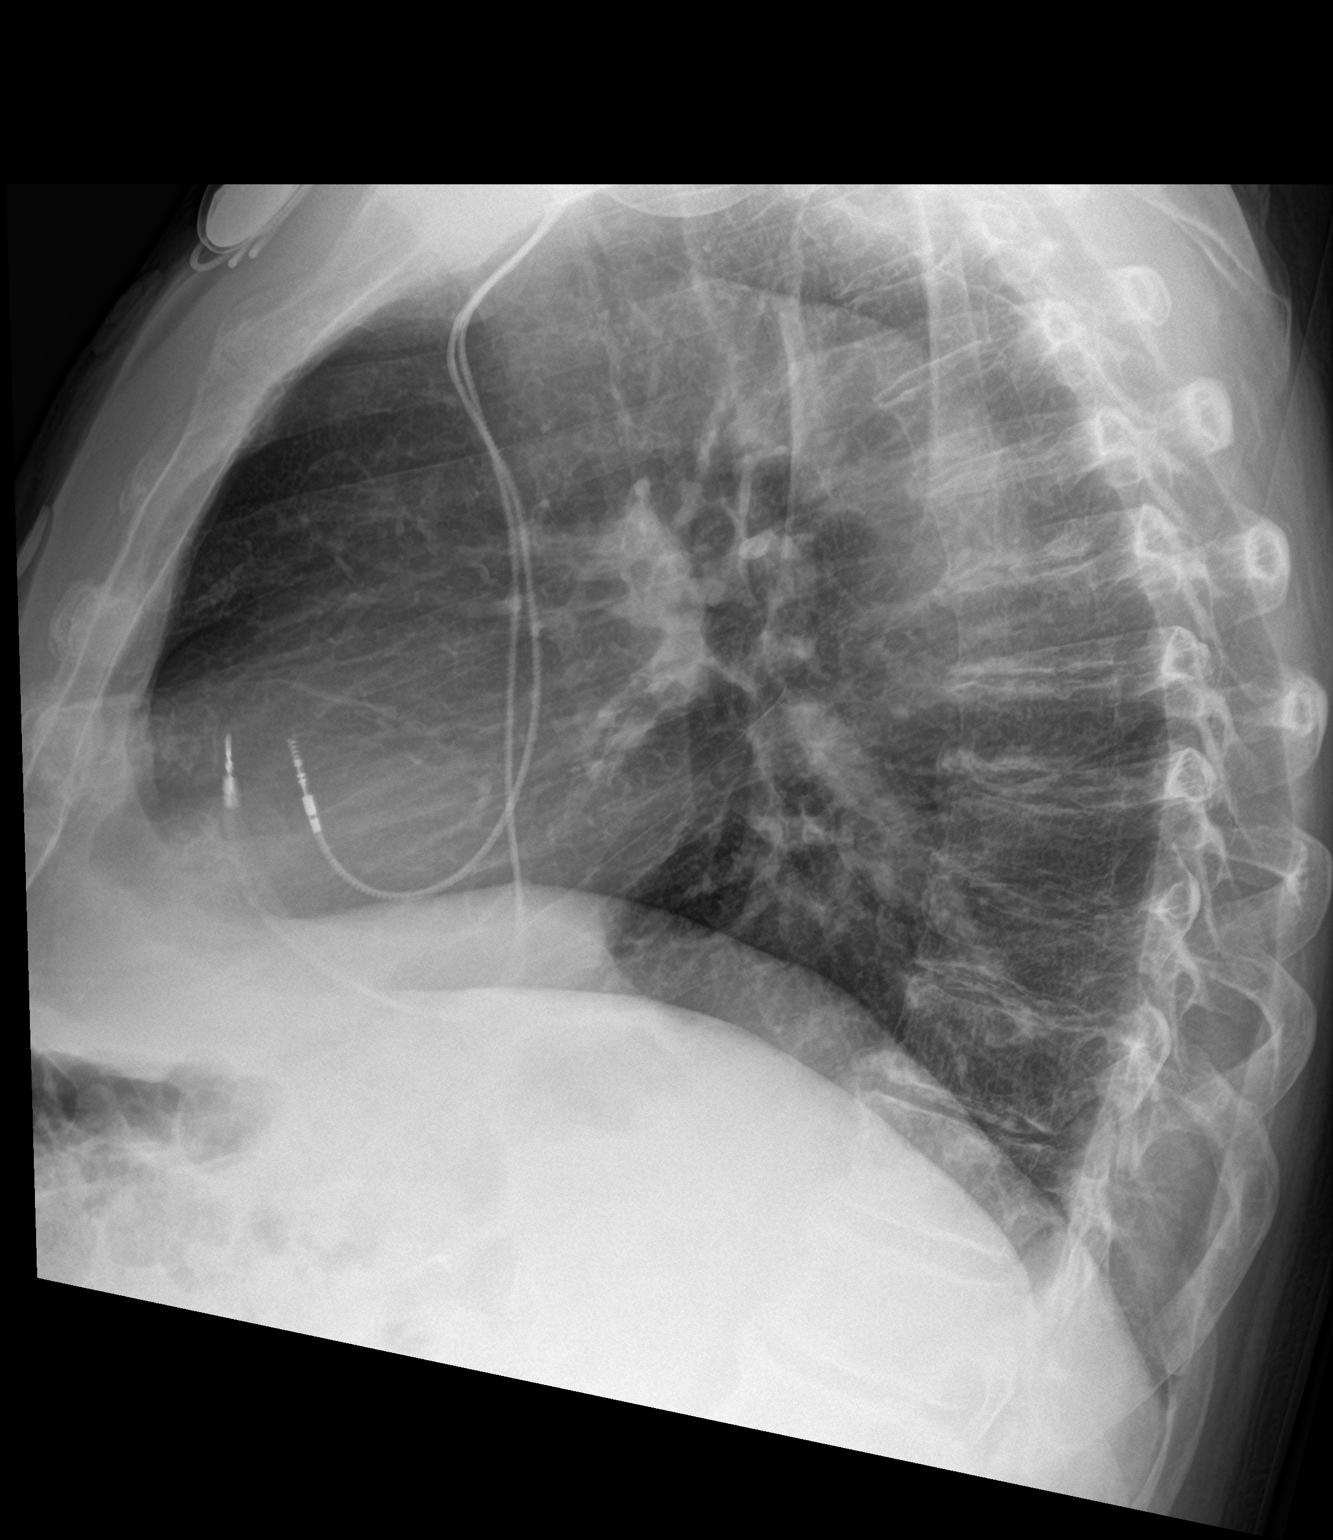

[chest ap]
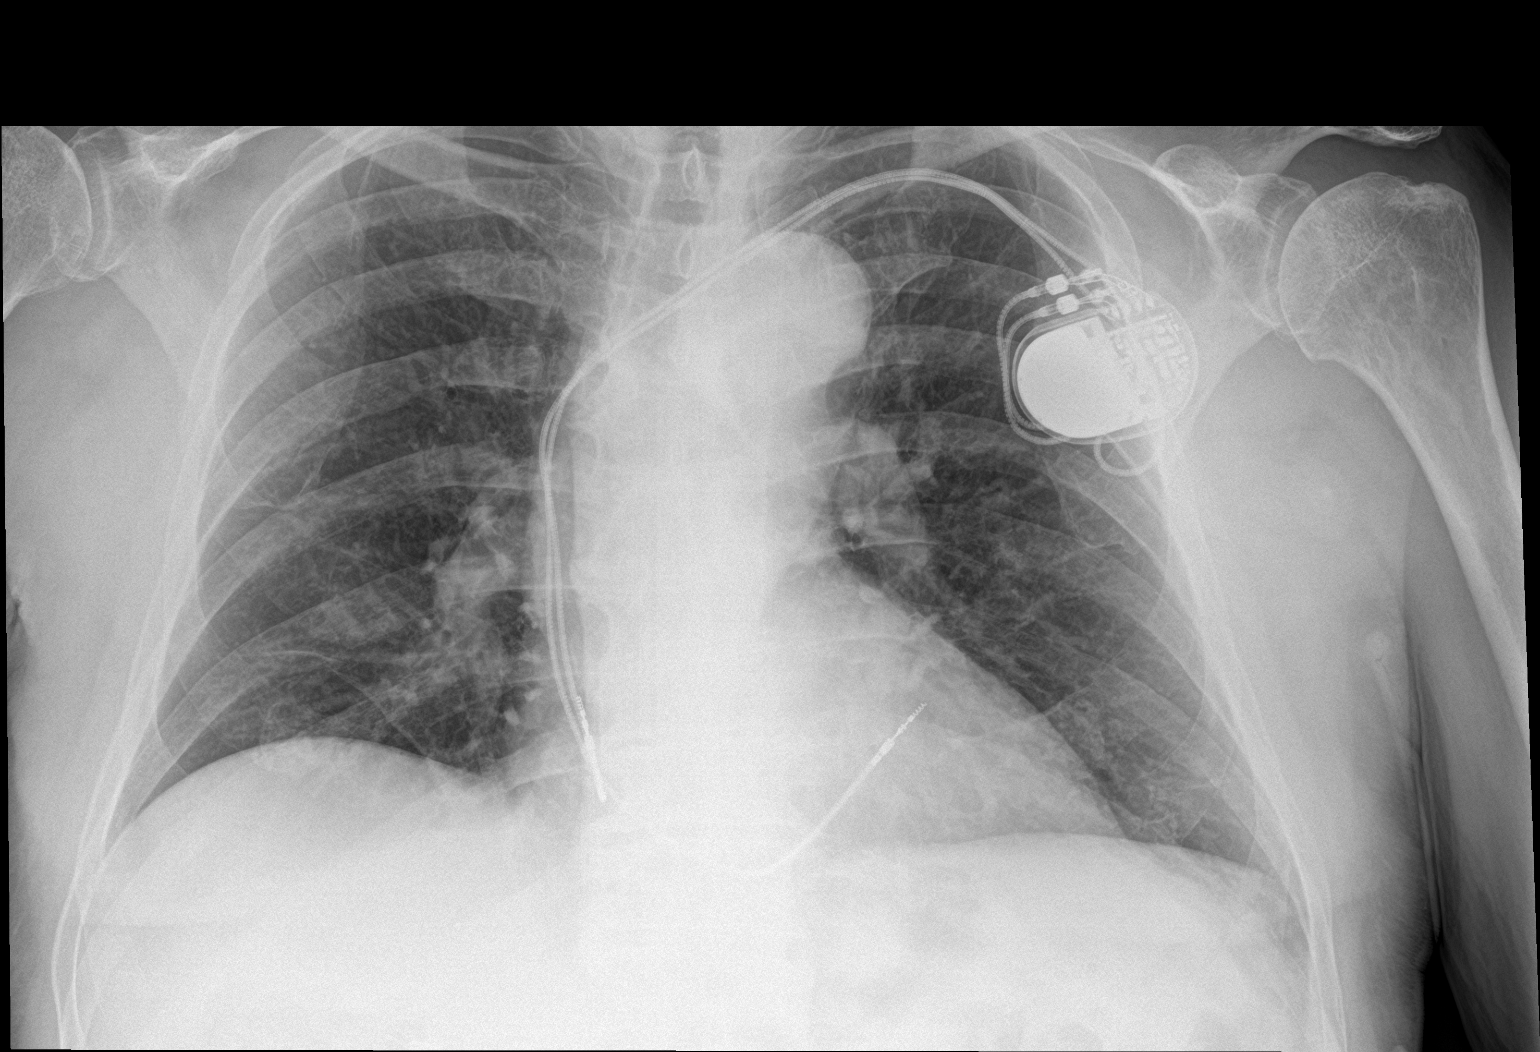

[2 of 2 positions shown; findings below may reference images not displayed]

FINDINGS: The heart size and mediastinal contours are within normal limits.
Both lungs are clear. No pneumothorax or pleural effusion is noted.
Left-sided pacemaker is unchanged in position. The visualized
skeletal structures are unremarkable.
IMPRESSION: No active cardiopulmonary disease.

## 2022-08-12 IMAGING — DX DG FOOT COMPLETE 3+V*L*
3 series · 3 of 3 positions shown · non-contrast
Comparison: None.

CLINICAL DATA: Redness, swelling, and pain in the great toe.

EXAM:
LEFT FOOT - COMPLETE 3+ VIEW

[foot ap]
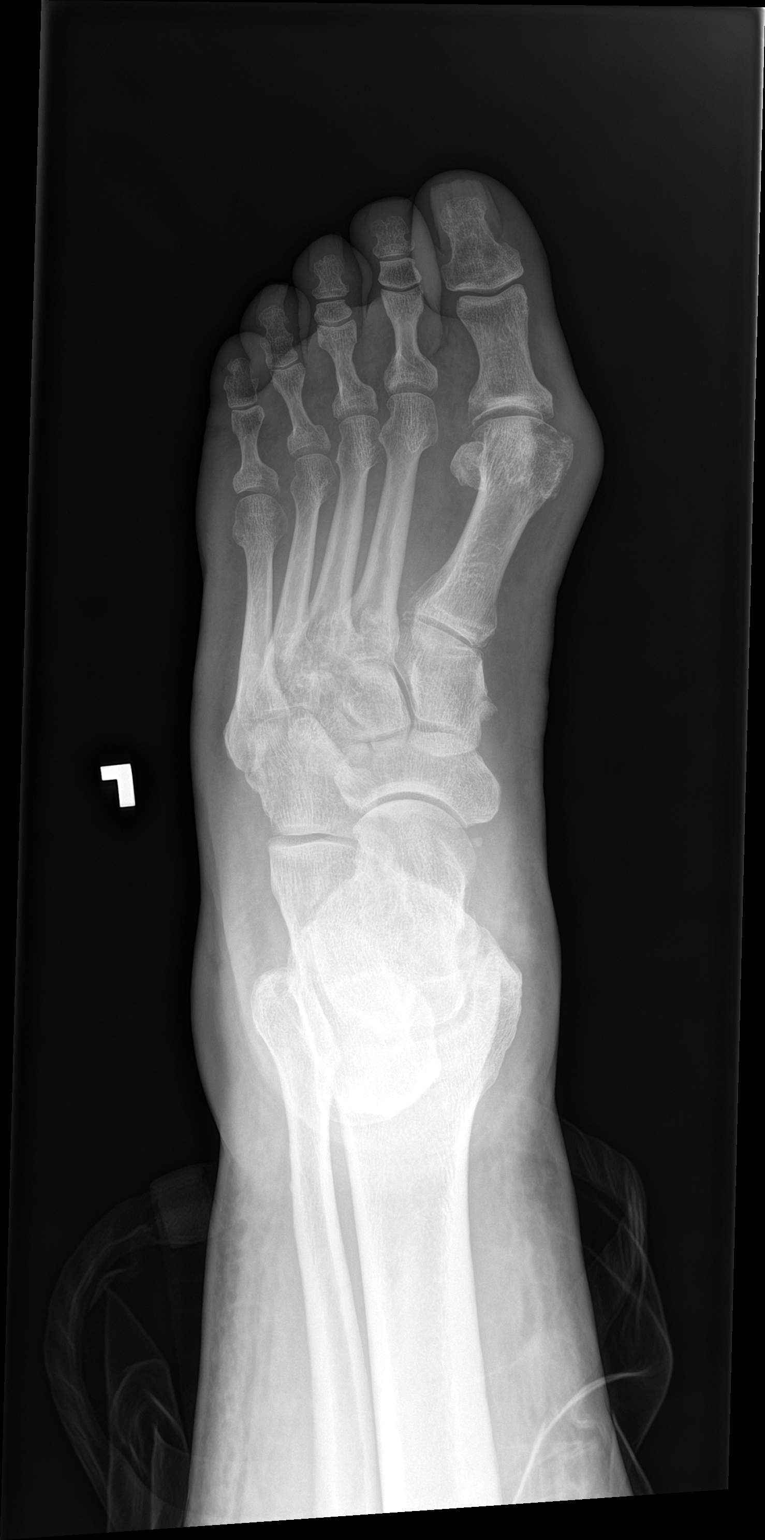

[foot obl]
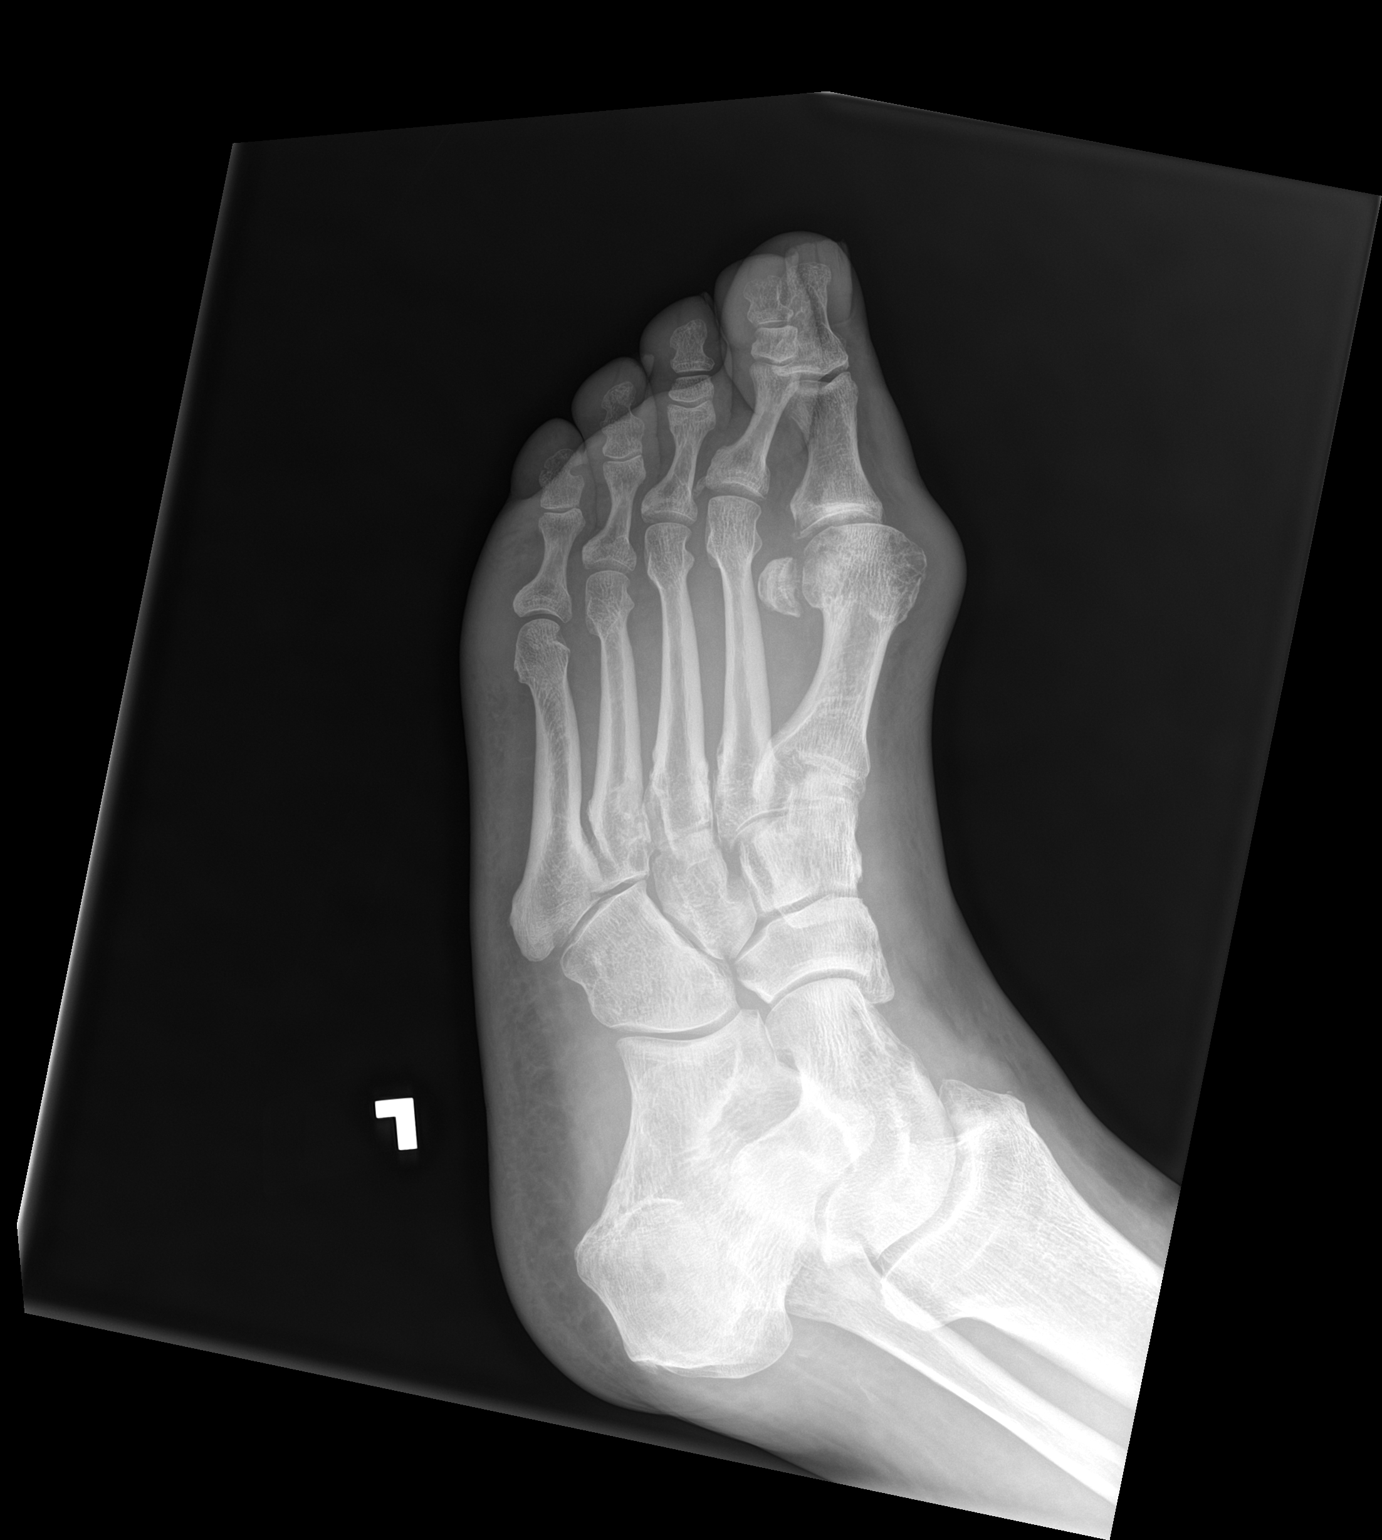

[foot lat]
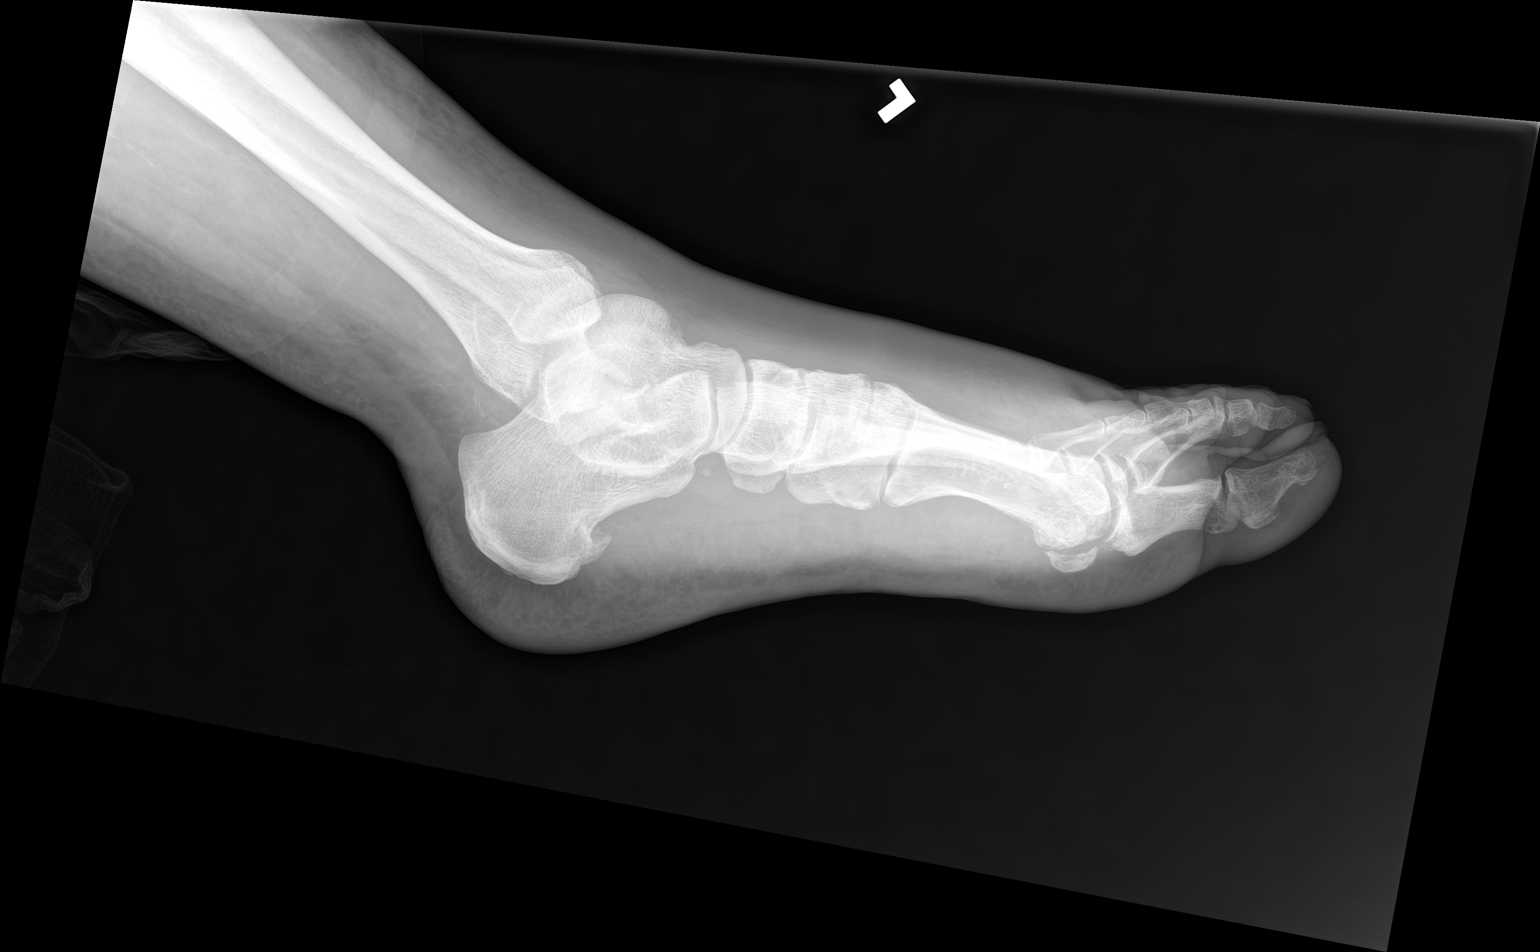

[3 of 3 positions shown; findings below may reference images not displayed]

FINDINGS: Right hallux valgus deformity with degenerative changes in the first
metatarsal-phalangeal joint. Focal lucency is suggested in the
medial aspect of the metatarsal head. This is likely degenerative
cyst. Gout or infection would be another possibility. No definite
cortical disruption. Overlying soft tissue swelling is present.
Dorsal soft tissue swelling also present. No soft tissue gas
identified. No acute fracture or dislocation.
IMPRESSION: Right hallux valgus deformity with degenerative changes in the first
metatarsal-phalangeal joint. Focal lucency in the medial aspect of
the first metatarsal head is likely degenerative cyst. Gout or
infection would be another possibility. Soft tissue swelling.
